# Patient Record
Sex: Male | Born: 1935 | Race: Black or African American | Hispanic: No | Marital: Married | Smoking: Former smoker
Health system: Southern US, Community
[De-identification: ages and names within clinical notes are randomized; demographics above are authoritative.]

## PROBLEM LIST (undated history)

## (undated) DIAGNOSIS — I10 Essential (primary) hypertension: Secondary | ICD-10-CM

## (undated) DIAGNOSIS — C801 Malignant (primary) neoplasm, unspecified: Secondary | ICD-10-CM

## (undated) DIAGNOSIS — N200 Calculus of kidney: Secondary | ICD-10-CM

## (undated) DIAGNOSIS — K8020 Calculus of gallbladder without cholecystitis without obstruction: Secondary | ICD-10-CM

## (undated) HISTORY — PX: PROSTATE SURGERY: SHX751

---

## 2011-12-02 ENCOUNTER — Observation Stay (HOSPITAL_COMMUNITY)
Admission: EM | Admit: 2011-12-02 | Discharge: 2011-12-03 | Disposition: A | Discharge status: Home / Self Care | Payer: Medicare Other | Attending: Internal Medicine | Admitting: Internal Medicine

## 2011-12-02 ENCOUNTER — Emergency Department (HOSPITAL_COMMUNITY): Payer: Medicare Other

## 2011-12-02 ENCOUNTER — Other Ambulatory Visit: Payer: Self-pay

## 2011-12-02 DIAGNOSIS — D509 Iron deficiency anemia, unspecified: Secondary | ICD-10-CM | POA: Insufficient documentation

## 2011-12-02 DIAGNOSIS — I10 Essential (primary) hypertension: Secondary | ICD-10-CM | POA: Diagnosis present

## 2011-12-02 DIAGNOSIS — R55 Syncope and collapse: Principal | ICD-10-CM | POA: Diagnosis present

## 2011-12-02 HISTORY — DX: Calculus of gallbladder without cholecystitis without obstruction: K80.20

## 2011-12-02 HISTORY — DX: Malignant (primary) neoplasm, unspecified: C80.1

## 2011-12-02 HISTORY — DX: Calculus of kidney: N20.0

## 2011-12-02 HISTORY — DX: Essential (primary) hypertension: I10

## 2011-12-02 LAB — GLUCOSE, CAPILLARY

## 2011-12-02 LAB — DIFFERENTIAL
Basophils Relative: 1 % (ref 0–1)
Lymphs Abs: 1.1 10*3/uL (ref 0.7–4.0)
Monocytes Relative: 7 % (ref 3–12)
Neutro Abs: 6.7 10*3/uL (ref 1.7–7.7)
Neutrophils Relative %: 79 % — ABNORMAL HIGH (ref 43–77)

## 2011-12-02 LAB — BASIC METABOLIC PANEL
BUN: 26 mg/dL — ABNORMAL HIGH (ref 6–23)
Chloride: 106 mEq/L (ref 96–112)
GFR calc Af Amer: 38 mL/min — ABNORMAL LOW (ref >90)
Glucose, Bld: 149 mg/dL — ABNORMAL HIGH (ref 70–99)
Potassium: 4.1 mEq/L (ref 3.5–5.1)

## 2011-12-02 LAB — CBC
Hemoglobin: 11.5 g/dL — ABNORMAL LOW (ref 13.0–17.0)
RBC: 5.26 MIL/uL (ref 4.22–5.81)
WBC: 8.5 10*3/uL (ref 4.0–10.5)

## 2011-12-02 LAB — POCT I-STAT TROPONIN I: Troponin i, poc: 0 ng/mL (ref 0.00–0.08)

## 2011-12-02 MED ORDER — TECHNETIUM TO 99M ALBUMIN AGGREGATED
6.0000 | Freq: Once | INTRAVENOUS | Status: AC | PRN
  Administered 2011-12-02: 6 via INTRAVENOUS

## 2011-12-02 MED ORDER — XENON XE 133 GAS
10.0000 | GAS_FOR_INHALATION | Freq: Once | RESPIRATORY_TRACT | Status: AC | PRN
  Administered 2011-12-02: 10 via RESPIRATORY_TRACT

## 2011-12-02 MED ORDER — SODIUM CHLORIDE 0.9 % IV BOLUS (SEPSIS)
1000.0000 mL | Freq: Once | INTRAVENOUS | Status: AC
  Administered 2011-12-02: 1000 mL via INTRAVENOUS

## 2011-12-02 NOTE — ED Notes (Signed)
Pt states he drove 400 miles yesterday, and was praying with a friend and became syncopal.

## 2011-12-02 NOTE — ED Notes (Signed)
Pt is in nuclear medicine

## 2011-12-02 NOTE — ED Notes (Signed)
Pt sleeping   Family at bedside. 

## 2011-12-02 NOTE — H&P (Signed)
[name] [name] is an 77 y.o. male.   PCP is at [facility] Chief Complaint: Syncope. HPI: 77 year-old male with history of hypertension had a sudden spell of loss of consciousness as witnessed by his niece in the church today around 2:30 PM while the patient was praying. The knee states that the patient may have lost consciousness for around 8-10 seconds. He regained his consciousness spontaneously. When the EMS came to pick him up his blood pressure systolic was found to be in the 70s. In the ER he was given some fluid and his blood pressures remained stable. Patient denies any chest pain, shortness of breath, any headache, visual symptoms, focal deficits, nausea vomiting or diarrhea. Patient had recently traveled to [place] by car. He drove the car 2 nights ago to [place] with his wife and came back last night and reached today morning 4:30 AM. He went to the church at 8 AM. In the ER patient was found to have mildly elevated creatinine and patient does not recall having any kidney problem other than kidney stones. He is also mildly anemic and patient states he had a colonoscopy a year and half ago which was normal. Due to his mildly elevated creatinine patient had VQ scan which was showing a low probability for PE. Patient has been admitted for observation.  Past Medical History  Diagnosis Date  . Cancer   . Hypertension   . Stone, kidney   . Stone, kidney     Past Surgical History  Procedure Date  . Prostate surgery     Family History  Problem Relation Age of Onset  . Lung cancer Sister    Social History:  reports that he has never smoked. He does not have any smokeless tobacco history on file. He reports that he does not drink alcohol or use illicit drugs.  Allergies: No Known Allergies  Medications Prior to Admission  Medication Dose Route Frequency Provider Last Rate Last Dose  . sodium chloride 0.9 % bolus 1,000 mL  1,000 mL Intravenous Once [employer]   1,000 mL at 12/02/11 1750  . technetium albumin aggregated (MAA) injection solution 6 milli Curie  6 milli Curie Intravenous Once PRN Medication Radiologist   [place] at 12/02/11 1905  . xenon xe 133 gas 10 milli Curie  10 milli Curie Inhalation Once PRN Medication Radiologist   [place] at 12/02/11 [id number]   No current outpatient prescriptions on file as of 12/02/2011.    Results for orders placed during the hospital encounter of 12/02/11 (from the past 48 hour(s))  CBC     Status: Abnormal   Collection Time   12/02/11  3:35 PM      Component Value Range Comment   WBC 8.5  4.0 - 10.5 (K/uL)    RBC 5.26  4.22 - 5.81 (MIL/uL)    Hemoglobin 11.5 (*) 13.0 - 17.0 (g/dL)    HCT 36.9 (*) 39.0 - 52.0 (%)    MCV 70.2 (*) 78.0 - 100.0 (fL)    MCH 21.9 (*) 26.0 - 34.0 (pg)    MCHC 31.2  30.0 - 36.0 (g/dL)    RDW 14.4  11.5 - 15.5 (%)    Platelets 187  150 - 400 (K/uL)   DIFFERENTIAL     Status: Abnormal   Collection Time   12/02/11  3:35 PM      Component Value Range Comment   Neutrophils Relative 79 (*) 43 - 77 (%)  Neutro Abs 6.7  1.7 - 7.7 (K/uL)    Lymphocytes Relative 13  12 - 46 (%)    Lymphs Abs 1.1  0.7 - 4.0 (K/uL)    Monocytes Relative 7  3 - 12 (%)    Monocytes Absolute 0.6  0.1 - 1.0 (K/uL)    Eosinophils Relative 1  0 - 5 (%)    Eosinophils Absolute 0.1  0.0 - 0.7 (K/uL)    Basophils Relative 1  0 - 1 (%)    Basophils Absolute 0.0  0.0 - 0.1 (K/uL)   BASIC METABOLIC PANEL     Status: Abnormal   Collection Time   12/02/11  3:35 PM      Component Value Range Comment   Sodium 139  135 - 145 (mEq/L)    Potassium 4.1  3.5 - 5.1 (mEq/L)    Chloride 106  96 - 112 (mEq/L)    CO2 26  19 - 32 (mEq/L)    Glucose, Bld 149 (*) 70 - 99 (mg/dL)    BUN 26 (*) 6 - 23 (mg/dL)    Creatinine, Ser 1.88 (*) 0.50 - 1.35 (mg/dL)    Calcium 9.3  8.4 - 10.5 (mg/dL)    GFR calc non Af Amer 33 (*) >90 (mL/min)    GFR calc Af Amer 38 (*) >90 (mL/min)   GLUCOSE, CAPILLARY      Status: Abnormal   Collection Time   12/02/11  3:51 PM      Component Value Range Comment   Glucose-Capillary 129 (*) 70 - 99 (mg/dL)    Comment 1 Notify RN      Comment 2 Documented in Chart     POCT I-STAT TROPONIN I     Status: Normal   Collection Time   12/02/11  4:05 PM      Component Value Range Comment   Troponin i, poc 0.00  0.00 - 0.08 (ng/mL)    Comment 3             Dg Chest 2 View  12/02/2011  *RADIOLOGY REPORT*  Clinical Data: Syncope  CHEST - 2 VIEW  Comparison: None.  Findings: Lungs are essentially clear. No pleural effusion or pneumothorax.  Mild elevation of the left hemidiaphragm.  Cardiomediastinal silhouette is within normal limits.  Mild degenerative changes of the visualized thoracolumbar spine.  IMPRESSION: No evidence of acute cardiopulmonary disease.  Original Report Authenticated By: Julian Hy, M.D.   Nm Pulmonary Per & Vent  12/02/2011  *RADIOLOGY REPORT*  Clinical Data: Syncope  NM PULMONARY VENTILATION AND PERFUSION SCAN  Radiopharmaceutical: 10 mCi xenon 133 gas.  6 mCi technetium 35m MAA.  Comparison: Chest x-ray from earlier the same day  Findings: Multiplanar perfusion imaging shows no segmental perfusion defect in either lung.  Asymmetric elevation of the left hemidiaphragm is seen on the x-ray is evident on perfusion imaging.  Ventilation imaging shows normal wash in with no substantial air trapping.  IMPRESSION: Normal bilateral lung perfusion.  Original Report Authenticated By: ERIC A. MANSELL, M.D.    Review of Systems  Constitutional: Negative.   HENT: Negative.   Eyes: Negative.   Respiratory: Negative.   Cardiovascular: Negative.   Gastrointestinal: Negative.   Genitourinary: Negative.   Musculoskeletal: Negative.   Skin: Negative.   Neurological: Positive for loss of consciousness.  Endo/Heme/Allergies: Negative.   Psychiatric/Behavioral: Negative.     Blood pressure 111/94, pulse 68, temperature 98.2 F (36.8 C), temperature  source Oral, resp. rate 18, SpO2 97.00%. Physical  Exam  Constitutional: He is oriented to person, place, and time. He appears well-developed and well-nourished. No distress.  HENT:  Head: Normocephalic and atraumatic.  Right Ear: External ear normal.  Left Ear: External ear normal.  Nose: Nose normal.  Mouth/Throat: Oropharynx is clear and moist. No oropharyngeal exudate.  Eyes: Conjunctivae and EOM are normal. Pupils are equal, round, and reactive to light. Right eye exhibits no discharge. Left eye exhibits no discharge.  Neck: Normal range of motion. Neck supple. No JVD present.  Cardiovascular: Normal rate, regular rhythm, normal heart sounds and intact distal pulses.   Respiratory: Effort normal and breath sounds normal. No stridor. No respiratory distress. He has no wheezes. He has no rales.  GI: Soft. Bowel sounds are normal. He exhibits no distension. There is no tenderness. There is no rebound.  Musculoskeletal: Normal range of motion. He exhibits no edema and no tenderness.  Neurological: He is alert and oriented to person, place, and time. He has normal reflexes. No cranial nerve deficit.       Moves upper and lower extremities 5/5.  Skin: Skin is warm and dry. No rash noted. He is not diaphoretic. No erythema.  Psychiatric: His behavior is normal.     Assessment/Plan #1. Syncope. #2. Mild renal failure. #3. Microcyte hypochromic anemia. #4. History of hypertension.  Plan Admit to telemetry for observation. We do not know the exact cause for his syncope could be from exhaustion from recent travel and dehydration. We will get a 2-D echo and a CT of the head without contrast for further workup on this syncope. In the ER he was not found to be orthostatic. We're going to hydrate the patient with normal saline and we'll hold off as antihypertensive, amlodipine for now as patient was found to be hypotensive initially. He does a mild insufficiency which patient does not recall  having. We will check a urine analysis and closely follow his metabolic panel. We'll also check creatine kinase to rule out rhabdomyolysis. Patient does have microcytic hypochromic anemia. We will recheck a CBC in a.m. and also check anemia panel. Patient states he had a colonoscopy a year and a half ago which was normal.  Gean Birchwood N. 12/02/2011, 10:58 PM

## 2011-12-02 NOTE — ED Provider Notes (Signed)
History     CSN: [id number] Arrival date & time: 12/02/2011  3:02 PM   First MD Initiated Contact with Patient 12/02/11 1504      Chief Complaint  Patient presents with  . Near Syncope    became dizzy at church and eased down to ground with syncope. pt denies and pain or problems.  hasn't eaten since last night    (Consider location/radiation/quality/duration/timing/severity/associated sxs/prior treatment) The history is provided by the patient, a relative and the spouse.   patient is a 19 are old male with a history of hypertension presents after syncopal episode. This occurred about 2 hours prior to arrival. Patient withstanding up at church when this occurred. He had been standing for some time, had his eyes closed, and was pain. He denied feeling any sense of lightheadedness, vertigo, chest pain, dyspnea during this time.  During this [place], he had a witnessed syncopal event. He fell over a seat backwards. He had a loss of consciousness for about 2 minutes. When he awoke he was alert and oriented and acting like his normal self. No generalized tonic-clonic activity, lipsmacking, eye deviation, tongue biting. He did have mild myoclonus of right hand that lasted a few seconds.  Since the episode patient feels like his normal self. He denies any recent fever or other infectious symptoms. He recently drove back from [place] overnight. He states he got home at 4 AM, went to bed, and then had to wake up early for church. He did not have anything significant he today. Patient has no history of prior syncope or  cardiac issues.  Overall severity noted to be mild to moderate nothing has been noted to make things better or worse.  When EMS arrived, patient was diaphoretic with systolic pressure in the low 70s. Blood sugar okay. Blood pressure improved at the scene.     Past Medical History  Diagnosis Date  . Hypertension   . Stone, kidney   . Stone, kidney   . Cancer     prostate  .  Gallstones     Past Surgical History  Procedure Date  . Prostate surgery     Family History  Problem Relation Age of Onset  . Lung cancer Sister     History  Substance Use Topics  . Smoking status: Former Smoker -- 0.5 packs/day for 20 years    Quit date: 12/02/1972  . Smokeless tobacco: Never Used  . Alcohol Use: No      Review of Systems  Constitutional: Negative for fever, chills and activity change.  HENT: Negative for congestion and neck pain.   Respiratory: Negative for cough, chest tightness, shortness of breath and wheezing.   Cardiovascular: Negative for chest pain.  Gastrointestinal: Negative for nausea, vomiting, abdominal pain, diarrhea and abdominal distention.  Genitourinary: Negative for difficulty urinating.  Musculoskeletal: Negative for gait problem.  Skin: Negative for rash.  Neurological: Negative for weakness and numbness.  Psychiatric/Behavioral: Negative for behavioral problems and confusion.  All other systems reviewed and are negative.    Allergies  Review of patient's allergies indicates no known allergies.  Home Medications   No current outpatient prescriptions on file.  BP 161/83  Pulse 68  Temp(Src) 97.6 F (36.4 C) (Oral)  Resp 19  Ht 6' 1.5" (1.867 m)  Wt 235 lb 10.8 oz (106.9 kg)  BMI 30.67 kg/m2  SpO2 97%  Physical Exam  Nursing note and vitals reviewed. Constitutional: He is oriented to person, place, and time. He appears well-developed  and well-nourished. No distress.  HENT:  Head: Normocephalic.  Nose: Nose normal.  Eyes: EOM are normal. Pupils are equal, round, and reactive to light. No scleral icterus.  Neck: Normal range of motion. Neck supple. No JVD present.  Cardiovascular: Normal rate, regular rhythm and intact distal pulses.   No murmur heard. Pulmonary/Chest: Effort normal and breath sounds normal. No respiratory distress. He has no wheezes.  Abdominal: Soft. Bowel sounds are normal. He exhibits no  distension. There is no tenderness.  Musculoskeletal: Normal range of motion. He exhibits no edema and no tenderness.  Neurological: He is alert and oriented to person, place, and time. He has normal strength. No cranial nerve deficit or sensory deficit. He displays a negative Romberg sign. Coordination normal. GCS eye subscore is 4. GCS verbal subscore is 5. GCS motor subscore is 6.       Normal strength No pronator drift  Skin: Skin is warm and dry. He is not diaphoretic.  Psychiatric: He has a normal mood and affect. His behavior is normal. Thought content normal.    ED Course  Procedures (including critical care time)   Date: 12/02/2011  Rate: 69  Rhythm: normal sinus rhythm  QRS Axis: normal  Intervals: normal  ST/T Wave abnormalities: nonspecific ST changes  Conduction Disutrbances:none  Narrative Interpretation: poor quality with artifact at end of strip.  No acute ischemia.  Old EKG Reviewed: none available     Labs Reviewed  CBC - Abnormal; Notable for the following:    Hemoglobin 11.5 (*)    HCT 36.9 (*)    MCV 70.2 (*)    MCH 21.9 (*)    All other components within normal limits  DIFFERENTIAL - Abnormal; Notable for the following:    Neutrophils Relative 79 (*)    All other components within normal limits  BASIC METABOLIC PANEL - Abnormal; Notable for the following:    Glucose, Bld 149 (*)    BUN 26 (*)    Creatinine, Ser 1.88 (*)    GFR calc non Af Amer 33 (*)    GFR calc Af Amer 38 (*)    All other components within normal limits  GLUCOSE, CAPILLARY - Abnormal; Notable for the following:    Glucose-Capillary 129 (*)    All other components within normal limits  POCT I-STAT TROPONIN I  COMPREHENSIVE METABOLIC PANEL  CBC  TSH  VITAMIN B12  FOLATE  IRON AND TIBC  FERRITIN  RETICULOCYTES  URINALYSIS, ROUTINE W REFLEX MICROSCOPIC  CARDIAC PANEL(CRET KIN+CKTOT+MB+TROPI)  CARDIAC PANEL(CRET KIN+CKTOT+MB+TROPI)  CARDIAC PANEL(CRET KIN+CKTOT+MB+TROPI)    Dg Chest 2 View  12/02/2011  *RADIOLOGY REPORT*  Clinical Data: Syncope  CHEST - 2 VIEW  Comparison: None.  Findings: Lungs are essentially clear. No pleural effusion or pneumothorax.  Mild elevation of the left hemidiaphragm.  Cardiomediastinal silhouette is within normal limits.  Mild degenerative changes of the visualized thoracolumbar spine.  IMPRESSION: No evidence of acute cardiopulmonary disease.  Original Report Authenticated By: Julian Hy, M.D.   Nm Pulmonary Per & Vent  12/02/2011  *RADIOLOGY REPORT*  Clinical Data: Syncope  NM PULMONARY VENTILATION AND PERFUSION SCAN  Radiopharmaceutical: 10 mCi xenon 133 gas.  6 mCi technetium 58m MAA.  Comparison: Chest x-ray from earlier the same day  Findings: Multiplanar perfusion imaging shows no segmental perfusion defect in either lung.  Asymmetric elevation of the left hemidiaphragm is seen on the x-ray is evident on perfusion imaging.  Ventilation imaging shows normal wash in with no substantial air  trapping.  IMPRESSION: Normal bilateral lung perfusion.  Original Report Authenticated By: ERIC A. MANSELL, M.D.     No diagnosis found.  Diagnosis: Syncope  MDM   Clinical picture is consistent with syncope. Patient did not have preceding chest pain or dyspnea. EKG here negative for interval prolongation or acute ischemia. His neurologic exam is normal and he did not have any focal neuro changes around this incident.  Also no evidence of head injury. No acute indication for head CT.  Patient has had significant amount of traveling recently in with episode of syncope concern about possibility of PE. CTA not possible due to her decreased GFR. VQ scan done and was low probability. Patient denies history of chronic kidney disease but his creatinine is 1.88. This is likely prerenal in etiology based on decreased by mouth intake lately. This may also be contributing to his syncope his orthostatic were negative. Based on age, nature of syncope, and  record her blood pressure in the 70s, consult to medicine who will admit for syncope. Patient was hemodynamically stable and stable from a mental status standpoint while [place]. No recent infectious symptoms that would suggest underlying infection as etiology of low blood pressure.       Fritz Pickerel 12/03/11 0031

## 2011-12-02 NOTE — ED Notes (Signed)
Patient is resting comfortably. 

## 2011-12-02 NOTE — ED Notes (Signed)
Vital signs stable. 

## 2011-12-02 NOTE — ED Notes (Signed)
Family at bedside. 

## 2011-12-02 NOTE — ED Notes (Signed)
Family at bedside.  Snacks offered to family.  Awaiting VQ scan

## 2011-12-02 NOTE — ED Notes (Signed)
Ice chips given with permission of provider

## 2011-12-02 NOTE — ED Notes (Signed)
Meal tray provided per pt request.  

## 2011-12-02 NOTE — ED Notes (Signed)
Patient denies pain and is resting comfortably.  

## 2011-12-02 NOTE — ED Notes (Signed)
Patient transported to X-ray 

## 2011-12-02 NOTE — ED Notes (Signed)
CBG is 129.  Patient c/o being hungry. Multiple family members at bedside

## 2011-12-03 ENCOUNTER — Encounter (HOSPITAL_COMMUNITY): Payer: Self-pay | Admitting: Nurse Practitioner

## 2011-12-03 ENCOUNTER — Inpatient Hospital Stay (HOSPITAL_COMMUNITY): Payer: Medicare Other

## 2011-12-03 LAB — CARDIAC PANEL(CRET KIN+CKTOT+MB+TROPI)
CK, MB: 2.9 ng/mL (ref 0.3–4.0)
Relative Index: 1.9 (ref 0.0–2.5)
Troponin I: <0.3 ng/mL (ref <0.30)

## 2011-12-03 LAB — CBC
HCT: 32 % — ABNORMAL LOW (ref 39.0–52.0)
MCHC: 30.6 g/dL (ref 30.0–36.0)
MCV: 70.8 fL — ABNORMAL LOW (ref 78.0–100.0)
Platelets: 165 10*3/uL (ref 150–400)
RDW: 14.5 % (ref 11.5–15.5)
WBC: 6.7 10*3/uL (ref 4.0–10.5)

## 2011-12-03 LAB — COMPREHENSIVE METABOLIC PANEL
ALT: 13 U/L (ref 0–53)
Albumin: 3.1 g/dL — ABNORMAL LOW (ref 3.5–5.2)
Alkaline Phosphatase: 98 U/L (ref 39–117)
BUN: 22 mg/dL (ref 6–23)
Chloride: 110 mEq/L (ref 96–112)
Glucose, Bld: 106 mg/dL — ABNORMAL HIGH (ref 70–99)
Potassium: 3.9 mEq/L (ref 3.5–5.1)
Sodium: 143 mEq/L (ref 135–145)
Total Bilirubin: 0.4 mg/dL (ref 0.3–1.2)
Total Protein: 6.3 g/dL (ref 6.0–8.3)

## 2011-12-03 LAB — FOLATE: Folate: 9.8 ng/mL

## 2011-12-03 LAB — IRON AND TIBC
Saturation Ratios: 23 % (ref 20–55)
UIBC: 203 ug/dL (ref 125–400)

## 2011-12-03 LAB — RETICULOCYTES
RBC.: 5.07 MIL/uL (ref 4.22–5.81)
Retic Count, Absolute: 40.6 10*3/uL (ref 19.0–186.0)
Retic Ct Pct: 0.8 % (ref 0.4–3.1)

## 2011-12-03 LAB — FERRITIN: Ferritin: 421 ng/mL — ABNORMAL HIGH (ref 22–322)

## 2011-12-03 MED ORDER — ONDANSETRON HCL 4 MG PO TABS: 4.0000 mg | ORAL_TABLET | Freq: Four times a day (QID) | ORAL | Status: DC | PRN

## 2011-12-03 MED ORDER — ACETAMINOPHEN 650 MG RE SUPP: 650.0000 mg | Freq: Four times a day (QID) | RECTAL | Status: DC | PRN

## 2011-12-03 MED ORDER — AMLODIPINE BESYLATE 10 MG PO TABS
10.0000 mg | ORAL_TABLET | Freq: Every day | ORAL | Status: DC
  Administered 2011-12-03: 10 mg via ORAL
  Filled 2011-12-03: qty 1

## 2011-12-03 MED ORDER — ONDANSETRON HCL 4 MG/2ML IJ SOLN: 4.0000 mg | Freq: Four times a day (QID) | INTRAMUSCULAR | Status: DC | PRN

## 2011-12-03 MED ORDER — SODIUM CHLORIDE 0.9 % IV SOLN
INTRAVENOUS | Status: DC
  Administered 2011-12-03: 03:00:00 via INTRAVENOUS

## 2011-12-03 MED ORDER — ACETAMINOPHEN 325 MG PO TABS: 650.0000 mg | ORAL_TABLET | Freq: Four times a day (QID) | ORAL | Status: DC | PRN

## 2011-12-03 NOTE — Discharge Summary (Signed)
Physician Discharge Summary  Patient ID: [name] [name] MRN: [id number] DOB/AGE: Oct 02, 1933 77 y.o.  Admit date: 12/02/2011 Discharge date: 12/03/2011  Primary Care Physician:  Provider Not In System   Discharge Diagnoses:    Principal Problem:  *Syncope Active Problems:  HTN (hypertension)    Discharge Medication List as of 12/03/2011 12:21 PM    CONTINUE these medications which have NOT CHANGED   Details  amLODipine (NORVASC) 10 MG tablet Take 10 mg by mouth daily.  , Until Discontinued, Historical Med         Disposition and Follow-up:  Discharge home in stable and improved condition. Needs to followup with his primary care physician in 3-4 weeks.  Consults:  none    Significant Diagnostic Studies:  Dg Chest 2 View  12/02/2011  *RADIOLOGY REPORT*  Clinical Data: Syncope  CHEST - 2 VIEW  Comparison: None.  Findings: Lungs are essentially clear. No pleural effusion or pneumothorax.  Mild elevation of the left hemidiaphragm.  Cardiomediastinal silhouette is within normal limits.  Mild degenerative changes of the visualized thoracolumbar spine.  IMPRESSION: No evidence of acute cardiopulmonary disease.  Original Report Authenticated By: Julian Hy, M.D.   Ct Head Wo Contrast  12/03/2011  *RADIOLOGY REPORT*  Clinical Data: 77 year old male with syncope.  History of hypertension.  Low blood pressure on presentation.  CT HEAD WITHOUT CONTRAST  Technique:  Contiguous axial images were obtained from the base of the skull through the vertex without contrast.  Comparison: None.  Findings: Visualized paranasal sinuses and mastoids are clear.  No acute osseous abnormality identified.  Visualized scalp soft tissues are within normal limits.  Visualized orbit soft tissues are within normal limits.  Generalized cerebral volume loss.  No ventriculomegaly. No midline shift, mass effect, or evidence of mass lesion.  No acute intracranial hemorrhage identified.  Dural calcifications.   Mild Calcified atherosclerosis at the skull base.  Mild for age scattered white and gray matter hypodensity, including punctate lesions in the right basal ganglia.  IMPRESSION: No acute intracranial abnormality.  Generalized cerebral volume loss.  Mild for age chronic small vessel disease.  Original Report Authenticated By: Randall An, M.D.   Nm Pulmonary Per & Vent  12/02/2011  *RADIOLOGY REPORT*  Clinical Data: Syncope  NM PULMONARY VENTILATION AND PERFUSION SCAN  Radiopharmaceutical: 10 mCi xenon 133 gas.  6 mCi technetium 75m MAA.  Comparison: Chest x-ray from earlier the same day  Findings: Multiplanar perfusion imaging shows no segmental perfusion defect in either lung.  Asymmetric elevation of the left hemidiaphragm is seen on the x-ray is evident on perfusion imaging.  Ventilation imaging shows normal wash in with no substantial air trapping.  IMPRESSION: Normal bilateral lung perfusion.  Original Report Authenticated By: ERIC A. MANSELL, M.D.    Brief H and P: For complete details please refer to admission H and P, but in brief patient is a 77 year old black man with a history of hypertension who had a sudden episode of loss of consciousness while in church. Because of this was a metatarsal raise for further evaluation and management.    Hospital Course:  Principal Problem:  *Syncope Active Problems:  HTN (hypertension)   #1 syncope: CT scan of the head was negative. He was not found to be orthostatic. He had traveled to and from [place] the same day he had the syncopal event so he had a VQ scan that ruled him out for pulmonary embolism. 2-D echo and carotid Dopplers were ordered however patient refused. Patient denies  having true loss of consciousness. He states this was a "spiritual event" and that the holy spirit came to him while he was giving communion to a teenager at church.  #2 acute renal failure: Creatinine on admission was 1.88. Patient denies ever being told he had  any sort of kidney dysfunction. His creatinine at time of discharge was 1.46. This is likely secondary to prerenal azotemia. He has been instructed to followup with his primary care doctor in 2-3 weeks for followup on his renal function.  Time spent on Discharge: Greater than 30 minutes.  SignedLelon Frohlich [facility] Pager: [phone] 12/03/2011, 5:52 PM

## 2011-12-03 NOTE — ED Provider Notes (Signed)
I saw and evaluated the patient, reviewed the resident's note and I agree with the findings and plan., ekg interpretation (reviewed by me)  RRR, clammy and pale on arrival. Syncope w/u unremarkable in ED but pt with SBP reportedly 70s on EMS arrival to scene. WIll admit for obs.  Blair Heys, MD 12/03/11 [phone]

## 2011-12-03 NOTE — Progress Notes (Signed)
Pt discharged to home via wheelchair with spouse. Discharge instructions given and pt. verbalized understanding. No further questions at this time. All pt. Belongings packed and sent with patient.

## 2011-12-03 NOTE — Progress Notes (Signed)
Cosign for Moises Blood LPN for medications and assessment

## 2011-12-03 NOTE — Progress Notes (Signed)
   CARE MANAGEMENT NOTE 12/03/2011  Patient:  [name],[name]   Account Number:  [id number]  Date Initiated:  12/03/2011  Documentation initiated by:  Tomi Bamberger  Subjective/Objective Assessment:   dx syncope, arf  admit- lives with spouse.  pta independent.     Action/Plan:   Anticipated DC Date:  12/03/2011   Anticipated DC Plan:  [place]  CM consult      Choice offered to / List presented to:             Status of service:  Completed, signed off Medicare Important Message given?   (If response is "NO", the following Medicare IM given date fields will be blank) Date Medicare IM given:   Date Additional Medicare IM given:    Discharge Disposition:  HOME/SELF CARE  Per UR Regulation:    Comments:  12/03/11 13:53 Tomi Bamberger RN, BSN [phone] Patient lives with spouse, pta independent, no needs identified.

## 2011-12-03 NOTE — Progress Notes (Signed)
Utilization review completed.  

## 2013-09-16 ENCOUNTER — Encounter (HOSPITAL_COMMUNITY): Payer: Self-pay | Admitting: Emergency Medicine

## 2013-09-16 ENCOUNTER — Emergency Department (INDEPENDENT_AMBULATORY_CARE_PROVIDER_SITE_OTHER)
Admission: EM | Admit: 2013-09-16 | Discharge: 2013-09-16 | Disposition: A | Discharge status: Home / Self Care | Payer: Medicare Other | Source: Home / Self Care | Attending: Family Medicine | Admitting: Family Medicine

## 2013-09-16 DIAGNOSIS — J309 Allergic rhinitis, unspecified: Secondary | ICD-10-CM

## 2013-09-16 DIAGNOSIS — J302 Other seasonal allergic rhinitis: Secondary | ICD-10-CM

## 2013-09-16 MED ORDER — CETIRIZINE HCL 10 MG PO TABS: 10.0000 mg | ORAL_TABLET | Freq: Every day | ORAL | Status: DC

## 2013-09-16 MED ORDER — TRIAMCINOLONE ACETONIDE 40 MG/ML IJ SUSP
INTRAMUSCULAR | Status: AC
  Filled 2013-09-16: qty 1

## 2013-09-16 MED ORDER — TRIAMCINOLONE ACETONIDE 40 MG/ML IJ SUSP
40.0000 mg | Freq: Once | INTRAMUSCULAR | Status: AC
  Administered 2013-09-16: 40 mg via INTRAMUSCULAR

## 2013-09-16 MED ORDER — FLUTICASONE PROPIONATE 50 MCG/ACT NA SUSP: 2.0000 | Freq: Two times a day (BID) | NASAL | Status: DC

## 2013-09-16 NOTE — ED Provider Notes (Signed)
CSN: [id number]     Arrival date & time 09/16/13  1622 History   First MD Initiated Contact with Patient 09/16/13 1728     Chief Complaint  Patient presents with  . Sinusitis   (Consider location/radiation/quality/duration/timing/severity/associated sxs/prior Treatment) Patient is a 79 y.o. male presenting with sinusitis. The history is provided by the patient.  Sinusitis Pain details:    Location:  Maxillary   Quality:  Pressure   Severity:  Mild   Duration:  2 days Progression:  Worsening Associated symptoms: cough, rhinorrhea and sneezing   Associated symptoms: no chills, no fever, no shortness of breath and no wheezing     Past Medical History  Diagnosis Date  . Hypertension   . Stone, kidney   . Stone, kidney   . Cancer     prostate  . Gallstones    Past Surgical History  Procedure Laterality Date  . Prostate surgery     Family History  Problem Relation Age of Onset  . Lung cancer Sister    History  Substance Use Topics  . Smoking status: Former Smoker -- 0.50 packs/day for 20 years    Quit date: 12/02/1972  . Smokeless tobacco: Never Used  . Alcohol Use: No    Review of Systems  Constitutional: Negative.  Negative for fever and chills.  HENT: Positive for rhinorrhea and sneezing.   Respiratory: Positive for cough. Negative for shortness of breath and wheezing.   Cardiovascular: Negative for leg swelling.    Allergies  Review of patient's allergies indicates no known allergies.  Home Medications   Current Outpatient Rx  Name  Route  Sig  Dispense  Refill  . amLODipine (NORVASC) 10 MG tablet   Oral   Take 10 mg by mouth daily.           . cetirizine (ZYRTEC) 10 MG tablet   Oral   Take 1 tablet (10 mg total) by mouth daily. One tab daily for allergies   30 tablet   1   . fluticasone (FLONASE) 50 MCG/ACT nasal spray   Nasal   Place 2 sprays into the nose 2 (two) times daily.   1 g   2    BP 157/99  Pulse 80  Temp(Src) 98.4 F (36.9 C)  (Oral)  Resp 19  SpO2 96% Physical Exam  Nursing note and vitals reviewed. Constitutional: He is oriented to person, place, and time. He appears well-developed and well-nourished.  HENT:  Head: Normocephalic.  Right Ear: External ear normal.  Left Ear: External ear normal.  Nose: Mucosal edema and rhinorrhea present.  Mouth/Throat: Oropharynx is clear and moist.  Neck: Normal range of motion. Neck supple.  Cardiovascular: Regular rhythm.   Pulmonary/Chest: Breath sounds normal.  Musculoskeletal: He exhibits no edema.  Lymphadenopathy:    He has no cervical adenopathy.  Neurological: He is alert and oriented to person, place, and time.  Skin: Skin is warm and dry.    ED Course  Procedures (including critical care time) Labs Review Labs Reviewed - No data to display Imaging Review No results found.  MDM      Billy Fischer, MD 09/16/13 [phone]

## 2013-09-16 NOTE — ED Notes (Signed)
Pt c/o sinus drainage with cough and sneezing x 2 days. No congestion or fever. Pt is alert and oriented.

## 2015-01-05 ENCOUNTER — Encounter (HOSPITAL_COMMUNITY): Payer: Self-pay | Admitting: Emergency Medicine

## 2015-01-05 ENCOUNTER — Emergency Department (HOSPITAL_COMMUNITY): Payer: Medicare Other

## 2015-01-05 ENCOUNTER — Emergency Department (HOSPITAL_COMMUNITY)
Admission: EM | Admit: 2015-01-05 | Discharge: 2015-01-06 | Disposition: A | Discharge status: Home / Self Care | Payer: Medicare Other | Attending: Emergency Medicine | Admitting: Emergency Medicine

## 2015-01-05 DIAGNOSIS — Z7951 Long term (current) use of inhaled steroids: Secondary | ICD-10-CM | POA: Diagnosis not present

## 2015-01-05 DIAGNOSIS — R600 Localized edema: Secondary | ICD-10-CM | POA: Diagnosis not present

## 2015-01-05 DIAGNOSIS — Z87891 Personal history of nicotine dependence: Secondary | ICD-10-CM | POA: Insufficient documentation

## 2015-01-05 DIAGNOSIS — R112 Nausea with vomiting, unspecified: Secondary | ICD-10-CM | POA: Insufficient documentation

## 2015-01-05 DIAGNOSIS — Z8546 Personal history of malignant neoplasm of prostate: Secondary | ICD-10-CM | POA: Diagnosis not present

## 2015-01-05 DIAGNOSIS — R531 Weakness: Secondary | ICD-10-CM | POA: Insufficient documentation

## 2015-01-05 DIAGNOSIS — I10 Essential (primary) hypertension: Secondary | ICD-10-CM | POA: Diagnosis not present

## 2015-01-05 DIAGNOSIS — Z8719 Personal history of other diseases of the digestive system: Secondary | ICD-10-CM | POA: Insufficient documentation

## 2015-01-05 DIAGNOSIS — R1032 Left lower quadrant pain: Secondary | ICD-10-CM | POA: Diagnosis not present

## 2015-01-05 DIAGNOSIS — Z7982 Long term (current) use of aspirin: Secondary | ICD-10-CM | POA: Insufficient documentation

## 2015-01-05 DIAGNOSIS — Z87442 Personal history of urinary calculi: Secondary | ICD-10-CM | POA: Diagnosis not present

## 2015-01-05 DIAGNOSIS — R197 Diarrhea, unspecified: Secondary | ICD-10-CM | POA: Insufficient documentation

## 2015-01-05 DIAGNOSIS — Z9889 Other specified postprocedural states: Secondary | ICD-10-CM | POA: Diagnosis not present

## 2015-01-05 DIAGNOSIS — Z79899 Other long term (current) drug therapy: Secondary | ICD-10-CM | POA: Diagnosis not present

## 2015-01-05 LAB — URINALYSIS, ROUTINE W REFLEX MICROSCOPIC
Bilirubin Urine: NEGATIVE
Glucose, UA: NEGATIVE mg/dL
Ketones, ur: NEGATIVE mg/dL
LEUKOCYTES UA: NEGATIVE
Nitrite: NEGATIVE
PH: 5 (ref 5.0–8.0)
PROTEIN: 30 mg/dL — AB
Specific Gravity, Urine: 1.022 (ref 1.005–1.030)
Urobilinogen, UA: 0.2 mg/dL (ref 0.0–1.0)

## 2015-01-05 LAB — CBC WITH DIFFERENTIAL/PLATELET
BASOS PCT: 0 % (ref 0–1)
Basophils Absolute: 0 10*3/uL (ref 0.0–0.1)
EOS ABS: 0 10*3/uL (ref 0.0–0.7)
Eosinophils Relative: 0 % (ref 0–5)
HEMATOCRIT: 44.1 % (ref 39.0–52.0)
HEMOGLOBIN: 14.1 g/dL (ref 13.0–17.0)
LYMPHS PCT: 3 % — AB (ref 12–46)
Lymphs Abs: 0.4 10*3/uL — ABNORMAL LOW (ref 0.7–4.0)
MCH: 22.8 pg — AB (ref 26.0–34.0)
MCHC: 32 g/dL (ref 30.0–36.0)
MCV: 71.2 fL — ABNORMAL LOW (ref 78.0–100.0)
Monocytes Absolute: 0.3 10*3/uL (ref 0.1–1.0)
Monocytes Relative: 2 % — ABNORMAL LOW (ref 3–12)
NEUTROS ABS: 12.9 10*3/uL — AB (ref 1.7–7.7)
Neutrophils Relative %: 95 % — ABNORMAL HIGH (ref 43–77)
Platelets: 188 10*3/uL (ref 150–400)
RBC: 6.19 MIL/uL — ABNORMAL HIGH (ref 4.22–5.81)
RDW: 14.1 % (ref 11.5–15.5)
WBC: 13.6 10*3/uL — ABNORMAL HIGH (ref 4.0–10.5)

## 2015-01-05 LAB — URINE MICROSCOPIC-ADD ON

## 2015-01-05 LAB — COMPREHENSIVE METABOLIC PANEL
ALK PHOS: 102 U/L (ref 39–117)
ALT: 17 U/L (ref 0–53)
AST: 20 U/L (ref 0–37)
Albumin: 4.2 g/dL (ref 3.5–5.2)
Anion gap: 8 (ref 5–15)
BILIRUBIN TOTAL: 0.9 mg/dL (ref 0.3–1.2)
BUN: 25 mg/dL — AB (ref 6–23)
CHLORIDE: 105 meq/L (ref 96–112)
CO2: 25 mmol/L (ref 19–32)
Calcium: 9.1 mg/dL (ref 8.4–10.5)
Creatinine, Ser: 1.63 mg/dL — ABNORMAL HIGH (ref 0.50–1.35)
GFR calc non Af Amer: 38 mL/min — ABNORMAL LOW (ref >90)
GFR, EST AFRICAN AMERICAN: 45 mL/min — AB (ref >90)
GLUCOSE: 189 mg/dL — AB (ref 70–99)
POTASSIUM: 4.4 mmol/L (ref 3.5–5.1)
Sodium: 138 mmol/L (ref 135–145)
Total Protein: 7.6 g/dL (ref 6.0–8.3)

## 2015-01-05 LAB — LIPASE, BLOOD: Lipase: 76 U/L — ABNORMAL HIGH (ref 11–59)

## 2015-01-05 MED ORDER — ONDANSETRON HCL 4 MG/2ML IJ SOLN
4.0000 mg | Freq: Once | INTRAMUSCULAR | Status: AC
  Administered 2015-01-05: 4 mg via INTRAVENOUS
  Filled 2015-01-05: qty 2

## 2015-01-05 MED ORDER — SODIUM CHLORIDE 0.9 % IV BOLUS (SEPSIS)
1000.0000 mL | Freq: Once | INTRAVENOUS | Status: AC
  Administered 2015-01-05: 1000 mL via INTRAVENOUS

## 2015-01-05 NOTE — ED Provider Notes (Signed)
CSN: [id number]     Arrival date & time 01/05/15  1832 History   First MD Initiated Contact with Patient 01/05/15 2224     Chief Complaint  Patient presents with  . Nausea  . Weakness     Patient is a 81 y.o. male presenting with weakness. The history is provided by the patient and the spouse. No language interpreter was used.  Weakness   [name] [name] presents for evaluation of vomiting and diarrhea. He was in his usual state of health until last night when he developed nausea, vomiting, diarrhea. He's had numerous episodes of diarrhea and a few episodes of vomiting to the point that he dry heaves. He had left lower quadrant abdominal pain last night that has improved today. He has been very fatigued today and has generalized weakness. He's been only able to drink a very small amount of fluids. Denies any fevers, chest pain, dysuria. He has no sick contacts. He has a history of prostate surgery. No recent antibiotic use.  Past Medical History  Diagnosis Date  . Hypertension   . Stone, kidney   . Stone, kidney   . Cancer     prostate  . Gallstones    Past Surgical History  Procedure Laterality Date  . Prostate surgery     Family History  Problem Relation Age of Onset  . Lung cancer Sister    History  Substance Use Topics  . Smoking status: Former Smoker -- 0.50 packs/day for 20 years    Quit date: 12/02/1972  . Smokeless tobacco: Never Used  . Alcohol Use: No    Review of Systems  Neurological: Positive for weakness.  All other systems reviewed and are negative.     Allergies  Review of patient's allergies indicates no known allergies.  Home Medications   Prior to Admission medications   Medication Sig Start Date End Date Taking? Authorizing Provider  acetaminophen (TYLENOL) 500 MG tablet Take 500 mg by mouth every 6 (six) hours as needed for mild pain.   Yes Historical Provider, MD  amLODipine (NORVASC) 10 MG tablet Take 10 mg by mouth daily.     Yes Historical  Provider, MD  aspirin 81 MG tablet Take 81 mg by mouth daily.   Yes Historical Provider, MD  cetirizine (ZYRTEC) 10 MG tablet Take 1 tablet (10 mg total) by mouth daily. One tab daily for allergies 09/16/13  Yes Billy Fischer, MD  fluticasone [facility]) 50 MCG/ACT nasal spray Place 2 sprays into the nose 2 (two) times daily. 09/16/13  Yes Billy Fischer, MD   BP 152/83 mmHg  Pulse 89  Temp(Src) 98.2 F (36.8 C) (Oral)  Resp 15  SpO2 98% Physical Exam  Constitutional: He is oriented to person, place, and time. He appears well-developed and well-nourished.  HENT:  Head: Normocephalic and atraumatic.  Cardiovascular: Normal rate and regular rhythm.   No murmur heard. Pulmonary/Chest: Effort normal and breath sounds normal. No respiratory distress.  Abdominal: Soft. There is no tenderness. There is no rebound and no guarding.  Musculoskeletal: He exhibits no tenderness.  Trace pitting edema in BLE  Neurological: He is alert and oriented to person, place, and time.  Skin: Skin is warm and dry.  Psychiatric: He has a normal mood and affect. His behavior is normal.  Nursing note and vitals reviewed.   ED Course  Procedures (including critical care time) Labs Review Labs Reviewed  CBC WITH DIFFERENTIAL - Abnormal; Notable for the following:  WBC 13.6 (*)    RBC 6.19 (*)    MCV 71.2 (*)    MCH 22.8 (*)    Neutrophils Relative % 95 (*)    Lymphocytes Relative 3 (*)    Monocytes Relative 2 (*)    Neutro Abs 12.9 (*)    Lymphs Abs 0.4 (*)    All other components within normal limits  COMPREHENSIVE METABOLIC PANEL - Abnormal; Notable for the following:    Glucose, Bld 189 (*)    BUN 25 (*)    Creatinine, Ser 1.63 (*)    GFR calc non Af Amer 38 (*)    GFR calc Af Amer 45 (*)    All other components within normal limits  LIPASE, BLOOD - Abnormal; Notable for the following:    Lipase 76 (*)    All other components within normal limits  URINALYSIS, ROUTINE W REFLEX MICROSCOPIC     Imaging Review No results found.   EKG Interpretation None      MDM   Final diagnoses:  Nausea vomiting and diarrhea    Patient here for evaluation of nausea, vomiting, and diarrhea. Patient has some left-sided abdominal tenderness yesterday which is improved today. CT scan obtained to evaluate for diverticulitis. BMP with stable renal insufficiency. UA not consistent with UTI.  Pt improved on recheck and tolerating oral fluids without difficulty. Plan to d/c home with close pcp follow up.  Return precautions were discussed.     Quintella Reichert, MD 01/08/15 [phone]

## 2015-01-05 NOTE — ED Notes (Signed)
Pt reports onset N/V/D last night, had about 6 episodes over night, and about 6 episodes each of emesis and diarrhea today. Pt reports generalized weakness. States he has been able to drink some water and gatorade today but has diarrhea soon after. Pt ambulatory with steady gait to and from bathroom. Denies feeling nauseous at this time. Denies pain. Abdomen nontender. NAD noted.

## 2015-01-05 NOTE — ED Notes (Addendum)
Pt states he has been having n/v/d thats started last night, has not been feeling good as well. Pt c/o fatigue has been sleeping all day.

## 2015-01-06 MED ORDER — ONDANSETRON HCL 4 MG PO TABS: 4.0000 mg | ORAL_TABLET | Freq: Four times a day (QID) | ORAL | Status: DC

## 2015-01-06 NOTE — Discharge Instructions (Signed)
Your CT scan shows multiple cysts on your kidneys.  Please follow up with your family doctor regarding this.    Viral Gastroenteritis Viral gastroenteritis is also known as stomach flu. This condition affects the stomach and intestinal tract. It can cause sudden diarrhea and vomiting. The illness typically lasts 3 to 8 days. Most people develop an immune response that eventually gets rid of the virus. While this natural response develops, the virus can make you quite ill. CAUSES  Many different viruses can cause gastroenteritis, such as rotavirus or noroviruses. You can catch one of these viruses by consuming contaminated food or water. You may also catch a virus by sharing utensils or other personal items with an infected person or by touching a contaminated surface. SYMPTOMS  The most common symptoms are diarrhea and vomiting. These problems can cause a severe loss of body fluids (dehydration) and a body salt (electrolyte) imbalance. Other symptoms may include:  Fever.  Headache.  Fatigue.  Abdominal pain. DIAGNOSIS  Your caregiver can usually diagnose viral gastroenteritis based on your symptoms and a physical exam. A stool sample may also be taken to test for the presence of viruses or other infections. TREATMENT  This illness typically goes away on its own. Treatments are aimed at rehydration. The most serious cases of viral gastroenteritis involve vomiting so severely that you are not able to keep fluids down. In these cases, fluids must be given through an intravenous line (IV). HOME CARE INSTRUCTIONS   Drink enough fluids to keep your urine clear or pale yellow. Drink small amounts of fluids frequently and increase the amounts as tolerated.  Ask your caregiver for specific rehydration instructions.  Avoid:  Foods high in sugar.  Alcohol.  Carbonated drinks.  Tobacco.  Juice.  Caffeine drinks.  Extremely hot or cold fluids.  Fatty, greasy foods.  Too much intake of  anything at one time.  Dairy products until 24 to 48 hours after diarrhea stops.  You may consume probiotics. Probiotics are active cultures of beneficial bacteria. They may lessen the amount and number of diarrheal stools in adults. Probiotics can be found in yogurt with active cultures and in supplements.  Wash your hands well to avoid spreading the virus.  Only take over-the-counter or prescription medicines for pain, discomfort, or fever as directed by your caregiver. Do not give aspirin to children. Antidiarrheal medicines are not recommended.  Ask your caregiver if you should continue to take your regular prescribed and over-the-counter medicines.  Keep all follow-up appointments as directed by your caregiver. SEEK IMMEDIATE MEDICAL CARE IF:   You are unable to keep fluids down.  You do not urinate at least once every 6 to 8 hours.  You develop shortness of breath.  You notice blood in your stool or vomit. This may look like coffee grounds.  You have abdominal pain that increases or is concentrated in one small area (localized).  You have persistent vomiting or diarrhea.  You have a fever.  The patient is a child younger than 3 months, and he or she has a fever.  The patient is a child older than 3 months, and he or she has a fever and persistent symptoms.  The patient is a child older than 3 months, and he or she has a fever and symptoms suddenly get worse.  The patient is a baby, and he or she has no tears when crying. MAKE SURE YOU:   Understand these instructions.  Will watch your condition.  Will  get help right away if you are not doing well or get worse. Document Released: 12/10/2005 Document Revised: 03/03/2012 Document Reviewed: 09/26/2011 [facility] Patient Information 2015 [place], [place]. This information is not intended to replace advice given to you by your health care provider. Make sure you discuss any questions you have with your health care  provider.

## 2015-03-27 ENCOUNTER — Emergency Department (HOSPITAL_COMMUNITY): Payer: Medicare Other

## 2015-03-27 ENCOUNTER — Observation Stay (HOSPITAL_COMMUNITY)
Admission: EM | Admit: 2015-03-27 | Discharge: 2015-03-28 | Disposition: A | Discharge status: Home / Self Care | Payer: Medicare Other | Attending: Internal Medicine | Admitting: Internal Medicine

## 2015-03-27 ENCOUNTER — Encounter (HOSPITAL_COMMUNITY): Payer: Self-pay | Admitting: *Deleted

## 2015-03-27 DIAGNOSIS — I129 Hypertensive chronic kidney disease with stage 1 through stage 4 chronic kidney disease, or unspecified chronic kidney disease: Secondary | ICD-10-CM | POA: Diagnosis not present

## 2015-03-27 DIAGNOSIS — Z8546 Personal history of malignant neoplasm of prostate: Secondary | ICD-10-CM | POA: Diagnosis not present

## 2015-03-27 DIAGNOSIS — R55 Syncope and collapse: Secondary | ICD-10-CM | POA: Diagnosis not present

## 2015-03-27 DIAGNOSIS — Z87891 Personal history of nicotine dependence: Secondary | ICD-10-CM | POA: Insufficient documentation

## 2015-03-27 DIAGNOSIS — D638 Anemia in other chronic diseases classified elsewhere: Secondary | ICD-10-CM | POA: Diagnosis not present

## 2015-03-27 DIAGNOSIS — N184 Chronic kidney disease, stage 4 (severe): Secondary | ICD-10-CM | POA: Diagnosis present

## 2015-03-27 DIAGNOSIS — I10 Essential (primary) hypertension: Secondary | ICD-10-CM | POA: Diagnosis not present

## 2015-03-27 DIAGNOSIS — Z7982 Long term (current) use of aspirin: Secondary | ICD-10-CM | POA: Insufficient documentation

## 2015-03-27 DIAGNOSIS — N179 Acute kidney failure, unspecified: Secondary | ICD-10-CM | POA: Diagnosis present

## 2015-03-27 LAB — BASIC METABOLIC PANEL
ANION GAP: 10 (ref 5–15)
BUN: 27 mg/dL — ABNORMAL HIGH (ref 6–23)
CALCIUM: 9.2 mg/dL (ref 8.4–10.5)
CHLORIDE: 104 mmol/L (ref 96–112)
CO2: 25 mmol/L (ref 19–32)
CREATININE: 2.45 mg/dL — AB (ref 0.50–1.35)
GFR calc Af Amer: 27 mL/min — ABNORMAL LOW (ref >90)
GFR calc non Af Amer: 23 mL/min — ABNORMAL LOW (ref >90)
GLUCOSE: 145 mg/dL — AB (ref 70–99)
POTASSIUM: 4.2 mmol/L (ref 3.5–5.1)
Sodium: 139 mmol/L (ref 135–145)

## 2015-03-27 LAB — CBG MONITORING, ED: GLUCOSE-CAPILLARY: 144 mg/dL — AB (ref 70–99)

## 2015-03-27 LAB — CBC
HCT: 40.4 % (ref 39.0–52.0)
Hemoglobin: 12.7 g/dL — ABNORMAL LOW (ref 13.0–17.0)
MCH: 22.5 pg — ABNORMAL LOW (ref 26.0–34.0)
MCHC: 31.4 g/dL (ref 30.0–36.0)
MCV: 71.5 fL — ABNORMAL LOW (ref 78.0–100.0)
Platelets: 201 10*3/uL (ref 150–400)
RBC: 5.65 MIL/uL (ref 4.22–5.81)
RDW: 13.8 % (ref 11.5–15.5)
WBC: 10.5 10*3/uL (ref 4.0–10.5)

## 2015-03-27 LAB — I-STAT TROPONIN, ED: Troponin i, poc: 0 ng/mL (ref 0.00–0.08)

## 2015-03-27 LAB — BRAIN NATRIURETIC PEPTIDE: B Natriuretic Peptide: 35.6 pg/mL (ref 0.0–100.0)

## 2015-03-27 MED ORDER — ACETAMINOPHEN 325 MG PO TABS: 650.0000 mg | ORAL_TABLET | Freq: Four times a day (QID) | ORAL | Status: DC | PRN

## 2015-03-27 MED ORDER — SODIUM CHLORIDE 0.9 % IV SOLN
INTRAVENOUS | Status: AC
  Administered 2015-03-27: 18:00:00 via INTRAVENOUS

## 2015-03-27 MED ORDER — AMLODIPINE BESYLATE 10 MG PO TABS
10.0000 mg | ORAL_TABLET | Freq: Every day | ORAL | Status: DC
  Administered 2015-03-28: 10 mg via ORAL
  Filled 2015-03-27: qty 1

## 2015-03-27 MED ORDER — SODIUM CHLORIDE 0.9 % IJ SOLN: 3.0000 mL | Freq: Two times a day (BID) | INTRAMUSCULAR | Status: DC

## 2015-03-27 MED ORDER — ONDANSETRON HCL 4 MG/2ML IJ SOLN: 4.0000 mg | Freq: Four times a day (QID) | INTRAMUSCULAR | Status: DC | PRN

## 2015-03-27 MED ORDER — SODIUM CHLORIDE 0.9 % IV BOLUS (SEPSIS)
500.0000 mL | Freq: Once | INTRAVENOUS | Status: DC
  Administered 2015-03-27: 500 mL via INTRAVENOUS

## 2015-03-27 MED ORDER — ACETAMINOPHEN 650 MG RE SUPP: 650.0000 mg | Freq: Four times a day (QID) | RECTAL | Status: DC | PRN

## 2015-03-27 MED ORDER — ONDANSETRON HCL 4 MG PO TABS: 4.0000 mg | ORAL_TABLET | Freq: Four times a day (QID) | ORAL | Status: DC | PRN

## 2015-03-27 MED ORDER — ASPIRIN 81 MG PO CHEW
81.0000 mg | CHEWABLE_TABLET | Freq: Every day | ORAL | Status: DC
  Administered 2015-03-28: 81 mg via ORAL
  Filled 2015-03-27: qty 1

## 2015-03-27 NOTE — Progress Notes (Signed)
Saw pt's family in waiting area (they are also my family members). [name] [name]'s wife told me which room pt was in and why they were here. W/nurse's permission took family members to pt room. Pt explained he "kind of went out" during church service today. Pt's wife, sister-in-law and brother-in-law were bedside. Had prayer w/pt and family. They were grateful for visit and prayer. Ernest Haber Chaplain   03/27/15 1700  Clinical Encounter Type  Visited With Patient and family together

## 2015-03-27 NOTE — ED Notes (Signed)
Bed: [id number] Expected date:  Expected time:  Means of arrival:  Comments: Ems syncope

## 2015-03-27 NOTE — ED Notes (Signed)
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

## 2015-03-27 NOTE — ED Provider Notes (Signed)
CSN: [id number]     Arrival date & time 03/27/15  1337 History   First MD Initiated Contact with Patient 03/27/15 1347     Chief Complaint  Patient presents with  . Loss of Consciousness     (Consider location/radiation/quality/duration/timing/severity/associated sxs/prior Treatment) Patient is a 81 y.o. male presenting with syncope. The history is provided by the patient.  Loss of Consciousness Episode history:  Single Most recent episode:  Today Duration:  1 minute Timing: once. Progression:  Resolved Chronicity:  New Context comment:  Was handing out communion at church Witnessed: no   Relieved by:  Nothing Worsened by:  Nothing tried Associated symptoms: no difficulty breathing, no fever, no shortness of breath and no vomiting     Past Medical History  Diagnosis Date  . Hypertension   . Stone, kidney   . Stone, kidney   . Cancer     prostate  . Gallstones    Past Surgical History  Procedure Laterality Date  . Prostate surgery     Family History  Problem Relation Age of Onset  . Lung cancer Sister    History  Substance Use Topics  . Smoking status: Former Smoker -- 0.50 packs/day for 20 years    Quit date: 12/02/1972  . Smokeless tobacco: Never Used  . Alcohol Use: No    Review of Systems  Constitutional: Negative for fever.  Respiratory: Negative for cough and shortness of breath.   Cardiovascular: Positive for syncope.  Gastrointestinal: Negative for vomiting and abdominal pain.  All other systems reviewed and are negative.     Allergies  Review of patient's allergies indicates no known allergies.  Home Medications   Prior to Admission medications   Medication Sig Start Date End Date Taking? Authorizing Provider  acetaminophen (TYLENOL) 500 MG tablet Take 500 mg by mouth every 6 (six) hours as needed for mild pain.   Yes Historical Provider, MD  amLODipine (NORVASC) 10 MG tablet Take 10 mg by mouth daily.     Yes Historical Provider, MD   aspirin 81 MG tablet Take 81 mg by mouth daily.   Yes Historical Provider, MD  Cyanocobalamin (VITAMIN B 12 PO) Take 1 tablet by mouth daily.   Yes Historical Provider, MD  Multiple Vitamins-Minerals (MULTIVITAMIN WITH MINERALS) tablet Take 1 tablet by mouth daily.   Yes Historical Provider, MD  cetirizine (ZYRTEC) 10 MG tablet Take 1 tablet (10 mg total) by mouth daily. One tab daily for allergies Patient not taking: Reported on 03/27/2015 09/16/13   Billy Fischer, MD  fluticasone [facility]) 50 MCG/ACT nasal spray Place 2 sprays into the nose 2 (two) times daily. Patient not taking: Reported on 03/27/2015 09/16/13   Billy Fischer, MD  ondansetron (ZOFRAN) 4 MG tablet Take 1 tablet (4 mg total) by mouth every 6 (six) hours. Patient not taking: Reported on 03/27/2015 01/06/15   Quintella Reichert, MD   BP 122/74 mmHg  Temp(Src) 97.9 F (36.6 C) (Oral)  Resp 18  SpO2 98% Physical Exam  Constitutional: He is oriented to person, place, and time. He appears well-developed and well-nourished. No distress.  HENT:  Head: Normocephalic and atraumatic.  Mouth/Throat: No oropharyngeal exudate.  Eyes: EOM are normal. Pupils are equal, round, and reactive to light.  Neck: Normal range of motion. Neck supple.  Cardiovascular: Normal rate and regular rhythm.  Exam reveals no friction rub.   No murmur heard. Pulmonary/Chest: Effort normal and breath sounds normal. No respiratory distress. He has no wheezes. He  has no rales.  Abdominal: He exhibits no distension. There is no tenderness. There is no rebound.  Musculoskeletal: Normal range of motion. He exhibits no edema.  Neurological: He is alert and oriented to person, place, and time.  Skin: He is not diaphoretic.  Nursing note and vitals reviewed.   ED Course  Procedures (including critical care time) Labs Review Labs Reviewed  CBC - Abnormal; Notable for the following:    Hemoglobin 12.7 (*)    MCV 71.5 (*)    MCH 22.5 (*)    All other components  within normal limits  BASIC METABOLIC PANEL - Abnormal; Notable for the following:    Glucose, Bld 145 (*)    BUN 27 (*)    Creatinine, Ser 2.45 (*)    GFR calc non Af Amer 23 (*)    GFR calc Af Amer 27 (*)    All other components within normal limits  CBG MONITORING, ED - Abnormal; Notable for the following:    Glucose-Capillary 144 (*)    All other components within normal limits  BRAIN NATRIURETIC PEPTIDE  I-STAT TROPOININ, ED    Imaging Review Dg Chest 2 View  03/27/2015   CLINICAL DATA:  Syncopal episode with urinary incontinence. History of controlled hypertension and prostate cancer. Ex-smoker. Initial encounter.  EXAM: CHEST  2 VIEW  COMPARISON:  Radiographs 12/02/2011.  Abdominal CT 01/05/2015.  FINDINGS: Stable mild left hemidiaphragm elevation/ eventration. The heart size and mediastinal contours are stable. The lungs are clear. There is no pleural effusion or pneumothorax. No acute osseous findings demonstrated. Telemetry leads overlie the chest.  IMPRESSION: Stable chest.  No active cardiopulmonary process.   Electronically Signed   By: Richardean Sale M.D.   On: 03/27/2015 14:29     EKG Interpretation   Date/Time:  Sunday March 27 2015 13:58:23 EDT Ventricular Rate:  64 PR Interval:  167 QRS Duration: 100 QT Interval:  416 QTC Calculation: 429 R Axis:   1 Text Interpretation:  Sinus rhythm Ventricular premature complex No  significant change since last tracing Confirmed by Mingo Amber  MD, [place]  ([id number]) on 03/27/2015 3:06:47 PM      MDM   Final diagnoses:  Syncope  Acute renal failure, unspecified acute renal failure type    73M here with syncope. Was preaching at church and doing communion. Didn't feel it coming on. Didn't hit his head. No prior CP, SOB. Had been acting normal. Has mild dementia per wife. Here acting appropriate, well-appearing, relaxing comfortably. Neuro exam benign. EKG ok. Labs show ARF, creatinine 2.45. Highest in past several months  1.88. Plan for admission for his syncope.    Evelina Bucy, MD 03/27/15 [phone]

## 2015-03-27 NOTE — H&P (Signed)
[facility] History and Physical  Ralston Kellas [id number] DOB: 10-27-1933 DOA: 03/27/2015  Referring physician: ER physician PCP: PROVIDER NOT IN SYSTEM   Chief Complaint: syncope   HPI:  81 year old male with past medical history of hypertension, chronic kidney disease who presented to [facility] ED after he had a brief episode of loss of consciousness. This has happened the day prior to the admission. Patient does not recall exactly events that led to the syncope. He reports he did not have chest pain or lightheadedness or shortness of breath. At this time he reports feeling fine with no dizziness or lightheadedness. No abdominal pain, nausea or vomiting. No fevers or chills. No constipation or diarrhea. No urinary complaints.  On admission, patient was hemodynamically stable. The 12-lead EKG showed sinus rhythm. Blood work was notable for creatinine of 2.45, baseline 1.8 in 2012. He was admitted for further evaluation and management of syncope.  Assessment & Plan    Principal Problem:   Syncope - Unclear etiology. Possible vasovagal. Patient does not have any complaints of chest pain. He feels fine at this time. - Obtain 2-D echo - Continue IV fluids. - Physical therapy evaluation  Active Problems:   HTN (hypertension) - Resume home medications    CKD (chronic kidney disease) stage 4, GFR 15-29 ml/min - As mentioned above, baseline creatinine 1.8 and on this admission 2.45. - Will give IV fluids and follow-up BMP tomorrow morning.    Anemia of chronic disease - Secondary to history of chronic kidney disease. Hemoglobin 12.7 stable.    DVT prophylaxis - SCDs bilaterally.   Radiological Exams on Admission: Dg Chest 2 View 03/27/2015  Stable chest.  No active cardiopulmonary process.   Electronically Signed   By: Richardean Sale M.D.   On: 03/27/2015 14:29    EKG: sinus rhythm   Code Status: Full Family Communication: Plan of care discussed with the patient  Disposition  Plan: Admit for further evaluation, telemetry floor   Leisa Lenz, MD  [facility] Pager [phone]  Review of Systems:  Constitutional: Negative for fever, chills and malaise/fatigue. Negative for diaphoresis.  HENT: Negative for hearing loss, ear pain, nosebleeds, congestion, sore throat, neck pain, tinnitus and ear discharge.   Eyes: Negative for blurred vision, double vision, photophobia, pain, discharge and redness.  Respiratory: Negative for cough, hemoptysis, sputum production, shortness of breath, wheezing and stridor.   Cardiovascular: Negative for chest pain, palpitations, orthopnea, claudication and leg swelling.  Gastrointestinal: Negative for nausea, vomiting and abdominal pain. Negative for heartburn, constipation, blood in stool and melena.  Genitourinary: Negative for dysuria, urgency, frequency, hematuria and flank pain.  Musculoskeletal: Negative for myalgias, back pain, joint pain and falls.  Skin: Negative for itching and rash.  Neurological: per HPI Endo/Heme/Allergies: Negative for environmental allergies and polydipsia. Does not bruise/bleed easily.  Psychiatric/Behavioral: Negative for suicidal ideas. The patient is not nervous/anxious.      Past Medical History  Diagnosis Date  . Hypertension   . Stone, kidney   . Stone, kidney   . Cancer     prostate  . Gallstones    Past Surgical History  Procedure Laterality Date  . Prostate surgery     Social History:  reports that he quit smoking about 42 years ago. He has never used smokeless tobacco. He reports that he does not drink alcohol or use illicit drugs.  No Known Allergies  Family History:  Family History  Problem Relation Age of Onset  . Lung cancer Sister  Prior to Admission medications   Medication Sig Start Date End Date Taking? Authorizing Provider  acetaminophen (TYLENOL) 500 MG tablet Take 500 mg by mouth every 6 (six) hours as needed for mild pain.   Yes Historical Provider, MD   amLODipine (NORVASC) 10 MG tablet Take 10 mg by mouth daily.     Yes Historical Provider, MD  aspirin 81 MG tablet Take 81 mg by mouth daily.   Yes Historical Provider, MD  Cyanocobalamin (VITAMIN B 12 PO) Take 1 tablet by mouth daily.   Yes Historical Provider, MD  Multiple Vitamins-Minerals (MULTIVITAMIN WITH MINERALS) tablet Take 1 tablet by mouth daily.   Yes Historical Provider, MD  cetirizine (ZYRTEC) 10 MG tablet Take 1 tablet (10 mg total) by mouth daily. One tab daily for allergies Patient not taking: Reported on 03/27/2015 09/16/13   Billy Fischer, MD  fluticasone [facility]) 50 MCG/ACT nasal spray Place 2 sprays into the nose 2 (two) times daily. Patient not taking: Reported on 03/27/2015 09/16/13   Billy Fischer, MD  ondansetron (ZOFRAN) 4 MG tablet Take 1 tablet (4 mg total) by mouth every 6 (six) hours. Patient not taking: Reported on 03/27/2015 01/06/15   Quintella Reichert, MD   Physical Exam: Filed Vitals:   03/27/15 1341 03/27/15 1349  BP:  122/74  Temp:  97.9 F (36.6 C)  TempSrc:  Oral  Resp:  18  SpO2: 98%     Physical Exam  Constitutional: Appears well-developed and well-nourished. No distress.  HENT: Normocephalic. No tonsillar erythema or exudates Eyes: Conjunctivae and EOM are normal. PERRLA, no scleral icterus.  Neck: Normal ROM. Neck supple. No JVD. No tracheal deviation. No thyromegaly.  CVS: RRR, S1/S2 +, no murmurs, no gallops, no carotid bruit.  Pulmonary: Effort and breath sounds normal, no stridor, rhonchi, wheezes, rales.  Abdominal: Soft. BS +,  no distension, tenderness, rebound or guarding.  Musculoskeletal: Normal range of motion. No edema and no tenderness.  Lymphadenopathy: No lymphadenopathy noted, cervical, inguinal. Neuro: Alert. Normal reflexes, muscle tone coordination. No focal neurologic deficits. Skin: Skin is warm and dry. No rash noted.  No erythema. No pallor.  Psychiatric: Normal mood and affect. Behavior, judgment, thought content normal.    Labs on Admission:  Basic Metabolic Panel:  Recent Labs Lab 03/27/15 1424  NA 139  K 4.2  CL 104  CO2 25  GLUCOSE 145*  BUN 27*  CREATININE 2.45*  CALCIUM 9.2   Liver Function Tests: No results for input(s): AST, ALT, ALKPHOS, BILITOT, PROT, ALBUMIN in the last 168 hours. No results for input(s): LIPASE, AMYLASE in the last 168 hours. No results for input(s): AMMONIA in the last 168 hours. CBC:  Recent Labs Lab 03/27/15 1424  WBC 10.5  HGB 12.7*  HCT 40.4  MCV 71.5*  PLT 201   Cardiac Enzymes: No results for input(s): CKTOTAL, CKMB, CKMBINDEX, TROPONINI in the last 168 hours. BNP: Invalid input(s): POCBNP CBG:  Recent Labs Lab 03/27/15 1433  GLUCAP 144*    If 7PM-7AM, please contact night-coverage [email or web address] Password [facility] 03/27/2015, 3:33 PM

## 2015-03-27 NOTE — ED Notes (Signed)
Family at bedside. 

## 2015-03-27 NOTE — ED Notes (Signed)
Patient transported to X-ray 

## 2015-03-28 DIAGNOSIS — R55 Syncope and collapse: Secondary | ICD-10-CM | POA: Diagnosis not present

## 2015-03-28 DIAGNOSIS — N179 Acute kidney failure, unspecified: Secondary | ICD-10-CM | POA: Insufficient documentation

## 2015-03-28 DIAGNOSIS — D638 Anemia in other chronic diseases classified elsewhere: Secondary | ICD-10-CM | POA: Diagnosis not present

## 2015-03-28 DIAGNOSIS — I10 Essential (primary) hypertension: Secondary | ICD-10-CM | POA: Diagnosis not present

## 2015-03-28 LAB — COMPREHENSIVE METABOLIC PANEL
ALBUMIN: 3.6 g/dL (ref 3.5–5.2)
ALK PHOS: 79 U/L (ref 39–117)
ALT: 15 U/L (ref 0–53)
AST: 14 U/L (ref 0–37)
Anion gap: 8 (ref 5–15)
BILIRUBIN TOTAL: 0.5 mg/dL (ref 0.3–1.2)
BUN: 24 mg/dL — ABNORMAL HIGH (ref 6–23)
CHLORIDE: 105 mmol/L (ref 96–112)
CO2: 27 mmol/L (ref 19–32)
Calcium: 8.9 mg/dL (ref 8.4–10.5)
Creatinine, Ser: 1.85 mg/dL — ABNORMAL HIGH (ref 0.50–1.35)
GFR calc Af Amer: 38 mL/min — ABNORMAL LOW (ref >90)
GFR calc non Af Amer: 33 mL/min — ABNORMAL LOW (ref >90)
Glucose, Bld: 142 mg/dL — ABNORMAL HIGH (ref 70–99)
POTASSIUM: 4.4 mmol/L (ref 3.5–5.1)
SODIUM: 140 mmol/L (ref 135–145)
TOTAL PROTEIN: 6.6 g/dL (ref 6.0–8.3)

## 2015-03-28 LAB — CBC
HEMATOCRIT: 38.3 % — AB (ref 39.0–52.0)
Hemoglobin: 11.8 g/dL — ABNORMAL LOW (ref 13.0–17.0)
MCH: 22.2 pg — ABNORMAL LOW (ref 26.0–34.0)
MCHC: 30.8 g/dL (ref 30.0–36.0)
MCV: 72.1 fL — AB (ref 78.0–100.0)
Platelets: 175 10*3/uL (ref 150–400)
RBC: 5.31 MIL/uL (ref 4.22–5.81)
RDW: 13.9 % (ref 11.5–15.5)
WBC: 9.2 10*3/uL (ref 4.0–10.5)

## 2015-03-28 LAB — GLUCOSE, CAPILLARY: Glucose-Capillary: 139 mg/dL — ABNORMAL HIGH (ref 70–99)

## 2015-03-28 NOTE — Progress Notes (Signed)
Echocardiogram 2D Echocardiogram has been performed.  [name] [name] 03/28/2015, 1:50 PM

## 2015-03-28 NOTE — Discharge Instructions (Addendum)
Syncope °Syncope is a medical term for fainting or passing out. This means you lose consciousness and drop to the ground. People are generally unconscious for less than 5 minutes. You may have some muscle twitches for up to 15 seconds before waking up and returning to normal. Syncope occurs more often in older adults, but it can happen to anyone. While most causes of syncope are not dangerous, syncope can be a sign of a serious medical problem. It is important to seek medical care.  °CAUSES  °Syncope is caused by a sudden drop in blood flow to the brain. The specific cause is often not determined. Factors that can bring on syncope include: °· Taking medicines that lower blood pressure. °· Sudden changes in posture, such as standing up quickly. °· Taking more medicine than prescribed. °· Standing in one place for too long. °· Seizure disorders. °· Dehydration and excessive exposure to heat. °· Low blood sugar (hypoglycemia). °· Straining to have a bowel movement. °· Heart disease, irregular heartbeat, or other circulatory problems. °· Fear, emotional distress, seeing blood, or severe pain. °SYMPTOMS  °Right before fainting, you may: °· Feel dizzy or light-headed. °· Feel nauseous. °· See all white or all black in your field of vision. °· Have cold, clammy skin. °DIAGNOSIS  °Your health care provider will ask about your symptoms, perform a physical exam, and perform an electrocardiogram (ECG) to record the electrical activity of your heart. Your health care provider may also perform other heart or blood tests to determine the cause of your syncope which may include: °· Transthoracic echocardiogram (TTE). During echocardiography, sound waves are used to evaluate how blood flows through your heart. °· Transesophageal echocardiogram (TEE). °· Cardiac monitoring. This allows your health care provider to monitor your heart rate and rhythm in real time. °· Holter monitor. This is a portable device that records your  heartbeat and can help diagnose heart arrhythmias. It allows your health care provider to track your heart activity for several days, if needed. °· Stress tests by exercise or by giving medicine that makes the heart beat faster. °TREATMENT  °In most cases, no treatment is needed. Depending on the cause of your syncope, your health care provider may recommend changing or stopping some of your medicines. °HOME CARE INSTRUCTIONS °· Have someone stay with you until you feel stable. °· Do not drive, use machinery, or play sports until your health care provider says it is okay. °· Keep all follow-up appointments as directed by your health care provider. °· Lie down right away if you start feeling like you might faint. Breathe deeply and steadily. Wait until all the symptoms have passed. °· Drink enough fluids to keep your urine clear or pale yellow. °· If you are taking blood pressure or heart medicine, get up slowly and take several minutes to sit and then stand. This can reduce dizziness. °SEEK IMMEDIATE MEDICAL CARE IF:  °· You have a severe headache. °· You have unusual pain in the chest, abdomen, or back. °· You are bleeding from your mouth or rectum, or you have black or tarry stool. °· You have an irregular or very fast heartbeat. °· You have pain with breathing. °· You have repeated fainting or seizure-like jerking during an episode. °· You faint when sitting or lying down. °· You have confusion. °· You have trouble walking. °· You have severe weakness. °· You have vision problems. °If you fainted, call your local emergency services (911 in [place] Do not drive   yourself to the hospital.  °MAKE SURE YOU: °· Understand these instructions. °· Will watch your condition. °· Will get help right away if you are not doing well or get worse. °Document Released: 12/10/2005 Document Revised: 12/15/2013 Document Reviewed: 02/08/2012 °ExitCare® Patient Information ©2015 [employer]. This information is not intended to replace  advice given to you by your health care provider. Make sure you discuss any questions you have with your health care provider. ° °

## 2015-03-28 NOTE — Evaluation (Signed)
Physical Therapy Evaluation Patient Details Name: Jerred Bastone MRN: [id number] DOB: [birth year] Today's Date: 03/28/2015   History of Present Illness  81 yo male admitted after having a syncopal episode at church. Hx of HTN, prostate cancer, mild dementia.   Clinical Impression  On eval, pt was supervision level assist for mobility-walked a total of ~1000 feet with/without IV pole. Pt tolerated activity well. No overt LOB but pt was slightly unsteady at times. Cautioned pt to return to activity slowly. Will follow during hospital stay.     Follow Up Recommendations No PT follow up;Supervision for mobility/OOB (initially)    Equipment Recommendations       Recommendations for Other Services       Precautions / Restrictions Precautions Precautions: Fall Restrictions Weight Bearing Restrictions: No      Mobility  Bed Mobility Overal bed mobility: Modified Independent                Transfers Overall transfer level: Modified independent                  Ambulation/Gait Ambulation/Gait assistance: Supervision Ambulation Distance (Feet): 1000 Feet Assistive device: None (IV pole) Gait Pattern/deviations: Step-through pattern     General Gait Details: slightly unsteady at times-pt bumped into door frame when returning to room.   Stairs            Wheelchair Mobility    Modified Rankin (Stroke Patients Only)       Balance                                             Pertinent Vitals/Pain Pain Assessment: No/denies pain    Home Living Family/patient expects to be discharged to:: Private residence Living Arrangements: Spouse/significant other   Type of Home: House Home Access: Level entry     [place]: [place]: [place] - single point      Prior Function Level of Independence: Independent               Hand Dominance        Extremity/Trunk Assessment   Upper Extremity Assessment: Overall  WFL for tasks assessed           Lower Extremity Assessment: Overall WFL for tasks assessed      Cervical / Trunk Assessment: Normal  Communication   Communication: No difficulties  Cognition Arousal/Alertness: Awake/alert Behavior During Therapy: WFL for tasks assessed/performed Overall Cognitive Status: Within Functional Limits for tasks assessed                      General Comments General comments (skin integrity, edema, etc.): NO overt LOB with activity. Pt denied lightheadedness/dizziness. Slightly unsteady at times. Cautioned pt to take his time and to return to activity slowly.    Exercises        Assessment/Plan    PT Assessment Patient needs continued PT services  PT Diagnosis Difficulty walking   PT Problem List Decreased mobility;Decreased balance  PT Treatment Interventions Gait training;Functional mobility training;Therapeutic activities;Therapeutic exercise;Balance training;Patient/family education   PT Goals (Current goals can be found in the Care Plan section) Acute Rehab PT Goals Patient Stated Goal: home soon PT Goal Formulation: With patient Time For Goal Achievement: 04/11/15 Potential to Achieve Goals: Good    Frequency Min 3X/week   Barriers to discharge  Co-evaluation               End of Session Equipment Utilized During Treatment: Gait belt Activity Tolerance: Patient tolerated treatment well Patient left: in chair;with call bell/phone within reach           Time: [id number] PT Time Calculation (min) (ACUTE ONLY): 21 min   Charges:   PT Evaluation $Initial PT Evaluation Tier I: 1 Procedure     PT G Codes:        Weston Anna, MPT Pager: [phone]

## 2015-03-28 NOTE — Discharge Summary (Signed)
Physician Discharge Summary  Chantry Torchio [id number] DOB: 25-Jun-1933 DOA: 03/27/2015  PCP: PROVIDER NOT IN SYSTEM  Admit date: 03/27/2015 Discharge date: 03/28/2015  Recommendations for Outpatient Follow-up:  1. 2 D ECHO normal 2. No changes in medications during this hospitalization  Of note, I spoke with the patient's wife at extent over the phone about the results of 2 D ECHO. Initially when ECHO was done I have offered that pt be discharged and that i will give them a call about the result. She requested pt stays until ECHO read. She then got very upset that i have kept her in hospital "all this time and result was normal". She further said" he got here and nothing was done and now he is going home". I have explained to her that we have done 2 D ECHO and BNP all this was normal. He did not have syncope or dizziness at all and walked fine per RN report. I asked is she had other question but she then hung up the phone.  Discharge Diagnoses:  Principal Problem:   Syncope Active Problems:   HTN (hypertension)   CKD (chronic kidney disease) stage 4, GFR 15-29 ml/min   Anemia of chronic disease    Discharge Condition: stable   Diet recommendation: as tolerated   History of present illness:  81 year old male with past medical history of hypertension, chronic kidney disease who presented to [facility] ED after he had a brief episode of loss of consciousness. This has happened the day prior to the admission. Patient does not recall exactly events that led to the syncope. He reports he did not have chest pain or lightheadedness or shortness of breath. At this time he reports feeling fine with no dizziness or lightheadedness. No abdominal pain, nausea or vomiting. No fevers or chills. No constipation or diarrhea. No urinary complaints.  On admission, patient was hemodynamically stable. The 12-lead EKG showed sinus rhythm. Blood work was notable for creatinine of 2.45, baseline 1.8 in 2012. He was  admitted for further evaluation and management of syncope.  Assessment & Plan    Principal Problem:  Syncope - Unclear etiology. Possible vasovagal. Patient does not have any complaints of chest pain. He feels fine at this time. - 2 D ECHO normal  - Per PT - no follow up required   Active Problems:  HTN (hypertension) - Resume home medications on discharge    CKD (chronic kidney disease) stage 4, GFR 15-29 ml/min - As mentioned above, baseline creatinine 1.8 and on this admission 2.45. Creatinine has trended down to 1.85.    Anemia of chronic disease - Secondary to history of chronic kidney disease. Hemoglobin 12.7 stable.    DVT prophylaxis - SCDs bilaterally.   Radiological Exams on Admission: Dg Chest 2 View 03/27/2015 Stable chest. No active cardiopulmonary process. Electronically Signed By: Richardean Sale M.D. On: 03/27/2015 14:29   Signed:  Leisa Lenz, MD  [facility] 03/28/2015, 10:57 AM  Pager #: [phone]  Procedures:  2 D ECHO  Consultations:  None   Discharge Exam: Filed Vitals:   03/28/15 0458  BP: 156/83  Pulse: 62  Temp: 98.5 F (36.9 C)  Resp: 16   Filed Vitals:   03/27/15 1900 03/27/15 2039 03/28/15 0144 03/28/15 0458  BP: 134/70 146/79 144/92 156/83  Pulse:  64 52 62  Temp:  99 F (37.2 C) 97.9 F (36.6 C) 98.5 F (36.9 C)  TempSrc:  Oral Oral Oral  Resp:  16 16 16   Height:  Weight:    104.781 kg (231 lb)  SpO2:  99% 100% 97%    General: Pt is alert, follows commands appropriately, not in acute distress Cardiovascular: Regular rate and rhythm, S1/S2 +, no murmurs Respiratory: Clear to auscultation bilaterally, no wheezing, no crackles, no rhonchi Abdominal: Soft, non tender, non distended, bowel sounds +, no guarding Extremities: no edema, no cyanosis, pulses palpable bilaterally DP and PT Neuro: Grossly nonfocal  Discharge Instructions  Discharge Instructions    Call MD for:  difficulty  breathing, headache or visual disturbances    Complete by:  As directed      Call MD for:  persistant nausea and vomiting    Complete by:  As directed      Call MD for:  severe uncontrolled pain    Complete by:  As directed      Diet - low sodium heart healthy    Complete by:  As directed      Increase activity slowly    Complete by:  As directed             Medication List    STOP taking these medications        cetirizine 10 MG tablet  Commonly known as:  ZYRTEC      TAKE these medications        acetaminophen 500 MG tablet  Commonly known as:  TYLENOL  Take 500 mg by mouth every 6 (six) hours as needed for mild pain.     amLODipine 10 MG tablet  Commonly known as:  NORVASC  Take 10 mg by mouth daily.     aspirin 81 MG tablet  Take 81 mg by mouth daily.     fluticasone 50 MCG/ACT nasal spray  Commonly known as:  FLONASE  Place 2 sprays into the nose 2 (two) times daily.     multivitamin with minerals tablet  Take 1 tablet by mouth daily.     ondansetron 4 MG tablet  Commonly known as:  ZOFRAN  Take 1 tablet (4 mg total) by mouth every 6 (six) hours.     VITAMIN B 12 PO  Take 1 tablet by mouth daily.          The results of significant diagnostics from this hospitalization (including imaging, microbiology, ancillary and laboratory) are listed below for reference.    Significant Diagnostic Studies: Dg Chest 2 View  03/27/2015   CLINICAL DATA:  Syncopal episode with urinary incontinence. History of controlled hypertension and prostate cancer. Ex-smoker. Initial encounter.  EXAM: CHEST  2 VIEW  COMPARISON:  Radiographs 12/02/2011.  Abdominal CT 01/05/2015.  FINDINGS: Stable mild left hemidiaphragm elevation/ eventration. The heart size and mediastinal contours are stable. The lungs are clear. There is no pleural effusion or pneumothorax. No acute osseous findings demonstrated. Telemetry leads overlie the chest.  IMPRESSION: Stable chest.  No active  cardiopulmonary process.   Electronically Signed   By: Richardean Sale M.D.   On: 03/27/2015 14:29    Microbiology: No results found for this or any previous visit (from the past 240 hour(s)).   Labs: Basic Metabolic Panel:  Recent Labs Lab 03/27/15 1424 03/28/15 0502  NA 139 140  K 4.2 4.4  CL 104 105  CO2 25 27  GLUCOSE 145* 142*  BUN 27* 24*  CREATININE 2.45* 1.85*  CALCIUM 9.2 8.9   Liver Function Tests:  Recent Labs Lab 03/28/15 0502  AST 14  ALT 15  ALKPHOS 79  BILITOT 0.5  PROT 6.6  ALBUMIN 3.6   No results for input(s): LIPASE, AMYLASE in the last 168 hours. No results for input(s): AMMONIA in the last 168 hours. CBC:  Recent Labs Lab 03/27/15 1424 03/28/15 0502  WBC 10.5 9.2  HGB 12.7* 11.8*  HCT 40.4 38.3*  MCV 71.5* 72.1*  PLT 201 175   Cardiac Enzymes: No results for input(s): CKTOTAL, CKMB, CKMBINDEX, TROPONINI in the last 168 hours. BNP: BNP (last 3 results)  Recent Labs  03/27/15 1424  BNP 35.6    ProBNP (last 3 results) No results for input(s): PROBNP in the last 8760 hours.  CBG:  Recent Labs Lab 03/27/15 1433 03/28/15 0454  GLUCAP 144* 139*    Time coordinating discharge: Over 30 minutes

## 2015-03-30 NOTE — Progress Notes (Signed)
   03/28/15 1130  PT Time Calculation  PT Start Time (ACUTE ONLY) 1054  PT Stop Time (ACUTE ONLY) 1115  PT Time Calculation (min) (ACUTE ONLY) 21 min  PT G-Codes **NOT FOR INPATIENT CLASS**  Functional Assessment Tool Used (clinical judgement)  Functional Limitation Mobility: Walking and moving around  Mobility: Walking and Moving Around Current Status [id number]) CI  Mobility: Walking and Moving Around Goal Status [id number]) CH  Mobility: Walking and Moving Around Discharge Status [id number]) CI  PT General Charges  $$ ACUTE PT VISIT 1 Procedure  PT Evaluation  $Initial PT Evaluation Tier I 1 Procedure   [name] [name],MPT [phone]

## 2015-11-06 ENCOUNTER — Observation Stay (HOSPITAL_COMMUNITY)
Admission: EM | Admit: 2015-11-06 | Discharge: 2015-11-07 | Disposition: A | Discharge status: Home / Self Care | Payer: Medicare Other | Attending: Family Medicine | Admitting: Family Medicine

## 2015-11-06 ENCOUNTER — Emergency Department (HOSPITAL_COMMUNITY): Payer: Medicare Other

## 2015-11-06 ENCOUNTER — Encounter (HOSPITAL_COMMUNITY): Payer: Self-pay | Admitting: *Deleted

## 2015-11-06 DIAGNOSIS — N184 Chronic kidney disease, stage 4 (severe): Secondary | ICD-10-CM | POA: Diagnosis not present

## 2015-11-06 DIAGNOSIS — Z87891 Personal history of nicotine dependence: Secondary | ICD-10-CM | POA: Diagnosis not present

## 2015-11-06 DIAGNOSIS — E785 Hyperlipidemia, unspecified: Secondary | ICD-10-CM | POA: Insufficient documentation

## 2015-11-06 DIAGNOSIS — Z79899 Other long term (current) drug therapy: Secondary | ICD-10-CM | POA: Insufficient documentation

## 2015-11-06 DIAGNOSIS — N179 Acute kidney failure, unspecified: Secondary | ICD-10-CM | POA: Diagnosis not present

## 2015-11-06 DIAGNOSIS — E119 Type 2 diabetes mellitus without complications: Secondary | ICD-10-CM | POA: Diagnosis not present

## 2015-11-06 DIAGNOSIS — I129 Hypertensive chronic kidney disease with stage 1 through stage 4 chronic kidney disease, or unspecified chronic kidney disease: Secondary | ICD-10-CM | POA: Diagnosis not present

## 2015-11-06 DIAGNOSIS — R4189 Other symptoms and signs involving cognitive functions and awareness: Secondary | ICD-10-CM | POA: Diagnosis not present

## 2015-11-06 DIAGNOSIS — I10 Essential (primary) hypertension: Secondary | ICD-10-CM

## 2015-11-06 DIAGNOSIS — G9001 Carotid sinus syncope: Principal | ICD-10-CM | POA: Insufficient documentation

## 2015-11-06 DIAGNOSIS — Z8546 Personal history of malignant neoplasm of prostate: Secondary | ICD-10-CM | POA: Insufficient documentation

## 2015-11-06 DIAGNOSIS — R55 Syncope and collapse: Secondary | ICD-10-CM | POA: Diagnosis not present

## 2015-11-06 LAB — URINALYSIS, ROUTINE W REFLEX MICROSCOPIC
Bilirubin Urine: NEGATIVE
GLUCOSE, UA: 250 mg/dL — AB
Hgb urine dipstick: NEGATIVE
Ketones, ur: NEGATIVE mg/dL
Leukocytes, UA: NEGATIVE
Nitrite: NEGATIVE
PH: 5 (ref 5.0–8.0)
Protein, ur: NEGATIVE mg/dL
Specific Gravity, Urine: 1.02 (ref 1.005–1.030)
Urobilinogen, UA: 0.2 mg/dL (ref 0.0–1.0)

## 2015-11-06 LAB — CBC WITH DIFFERENTIAL/PLATELET
BASOS ABS: 0 10*3/uL (ref 0.0–0.1)
Basophils Relative: 0 %
Eosinophils Absolute: 0 10*3/uL (ref 0.0–0.7)
Eosinophils Relative: 0 %
HEMATOCRIT: 38 % — AB (ref 39.0–52.0)
Hemoglobin: 12 g/dL — ABNORMAL LOW (ref 13.0–17.0)
Lymphocytes Relative: 7 %
Lymphs Abs: 0.6 10*3/uL — ABNORMAL LOW (ref 0.7–4.0)
MCH: 22.6 pg — ABNORMAL LOW (ref 26.0–34.0)
MCHC: 31.6 g/dL (ref 30.0–36.0)
MCV: 71.7 fL — AB (ref 78.0–100.0)
MONO ABS: 0.5 10*3/uL (ref 0.1–1.0)
MONOS PCT: 5 %
NEUTROS ABS: 8 10*3/uL — AB (ref 1.7–7.7)
Neutrophils Relative %: 88 %
Platelets: 170 10*3/uL (ref 150–400)
RBC: 5.3 MIL/uL (ref 4.22–5.81)
RDW: 14 % (ref 11.5–15.5)
WBC: 9.1 10*3/uL (ref 4.0–10.5)

## 2015-11-06 LAB — BASIC METABOLIC PANEL
Anion gap: 10 (ref 5–15)
BUN: 28 mg/dL — AB (ref 6–20)
CO2: 24 mmol/L (ref 22–32)
Calcium: 9.1 mg/dL (ref 8.9–10.3)
Chloride: 105 mmol/L (ref 101–111)
Creatinine, Ser: 2.04 mg/dL — ABNORMAL HIGH (ref 0.61–1.24)
GFR calc Af Amer: 34 mL/min — ABNORMAL LOW (ref >60)
GFR, EST NON AFRICAN AMERICAN: 29 mL/min — AB (ref >60)
GLUCOSE: 261 mg/dL — AB (ref 65–99)
POTASSIUM: 4.3 mmol/L (ref 3.5–5.1)
Sodium: 139 mmol/L (ref 135–145)

## 2015-11-06 LAB — I-STAT TROPONIN, ED: Troponin i, poc: 0.01 ng/mL (ref 0.00–0.08)

## 2015-11-06 LAB — CBG MONITORING, ED: Glucose-Capillary: 236 mg/dL — ABNORMAL HIGH (ref 65–99)

## 2015-11-06 LAB — I-STAT CG4 LACTIC ACID, ED: Lactic Acid, Venous: 2.8 mmol/L (ref 0.5–2.0)

## 2015-11-06 MED ORDER — AMLODIPINE BESYLATE 5 MG PO TABS
5.0000 mg | ORAL_TABLET | Freq: Every day | ORAL | Status: DC
  Administered 2015-11-07: 5 mg via ORAL
  Filled 2015-11-06: qty 1

## 2015-11-06 MED ORDER — ACETAMINOPHEN 500 MG PO TABS: 500.0000 mg | ORAL_TABLET | Freq: Four times a day (QID) | ORAL | Status: DC | PRN

## 2015-11-06 MED ORDER — VITAMIN B 12 100 MCG PO LOZG: LOZENGE | Freq: Every day | ORAL | Status: DC

## 2015-11-06 MED ORDER — ADULT MULTIVITAMIN W/MINERALS CH
1.0000 | ORAL_TABLET | Freq: Every day | ORAL | Status: DC
  Administered 2015-11-07: 1 via ORAL
  Filled 2015-11-06: qty 1

## 2015-11-06 MED ORDER — ENOXAPARIN SODIUM 40 MG/0.4ML ~~LOC~~ SOLN
40.0000 mg | SUBCUTANEOUS | Status: DC
  Administered 2015-11-07: 40 mg via SUBCUTANEOUS
  Filled 2015-11-06: qty 0.4

## 2015-11-06 MED ORDER — SODIUM CHLORIDE 0.9 % IV SOLN
Freq: Once | INTRAVENOUS | Status: AC
  Administered 2015-11-06: 17:00:00 via INTRAVENOUS

## 2015-11-06 MED ORDER — ASPIRIN 81 MG PO CHEW
81.0000 mg | CHEWABLE_TABLET | Freq: Every day | ORAL | Status: DC
  Administered 2015-11-07: 81 mg via ORAL
  Filled 2015-11-06: qty 1

## 2015-11-06 MED ORDER — SODIUM CHLORIDE 0.9 % IJ SOLN
3.0000 mL | Freq: Two times a day (BID) | INTRAMUSCULAR | Status: DC
  Administered 2015-11-06 – 2015-11-07 (×2): 3 mL via INTRAVENOUS

## 2015-11-06 MED ORDER — VITAMIN B-12 100 MCG PO TABS
100.0000 ug | ORAL_TABLET | Freq: Every day | ORAL | Status: DC
  Administered 2015-11-07: 100 ug via ORAL
  Filled 2015-11-06: qty 1

## 2015-11-06 NOTE — ED Notes (Signed)
[place] from [facility] via EMS-Per EMS, pt was preaching, became pale and had a syncopal episode that lasted a few minutes. Pt family assumed that he had a hypoglycemic episode, but CBG on scene 275. Pt was given lemonade by family and last CBG was 320. Pt had pinpoint pupils and no radial pulse on scene, but en route was A&O and in NAD

## 2015-11-06 NOTE — ED Notes (Signed)
During carotid massage performed by Dr Verlon Au, pt's pulse dropped to upper 30s and BP dropped from 123456 systolic to 99991111 systolic.  No acute distress noted.

## 2015-11-06 NOTE — H&P (Signed)
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  Almost towards end sermon, looked pale and completed sermon and then had to be assisted to sitting the chair.  episodic LOC ~ 1-2 min, was a little confused No cp, no sz like activity, no tongue biting, no loss bowel/bladder continence No fever no chills CBG measured was 79 usually at home in the 130-140 range and not below  Brought to ED-noted BP was low [id number] systolic but rebounded upwards Had taken his DM mediciation-but hasn't brought it with him today.  States did not feel any vertigo, lightheadedness just felt overall "weak" generally Did not feel prodrome prior to this deficit   No f,chills,cp,n,v,diarr,dark stool,tarry stool,recentt fall,rash,dysuria,blurred nor double vision  Emergency room workup revealed a lactic acid 2.8, BUN/creatinine 28/2.04, hemoglobin 12, white count 9.1, platelet 170 glucose 261 2 view chest x-ray showed no new findings 0.8 troponin negative EKG showed normal sinus rhythm PR interval 0.12 QRS axis -10 no acute ST-T wave changes and rapid precordial transition   Used to be in the [employer] forte in the [place] War Used to be a Engineer, 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 Hypertension   . Stone, kidney   . Stone, kidney   . Cancer     prostate  . Gallstones    Past Surgical History  Procedure Laterality Date  . Prostate surgery     Social History:  Social History   Social History Narrative    No Known Allergies  Family History  Problem Relation Age of Onset  . Lung cancer Sister     Prior to Admission medications   Medication Sig Start Date End Date Taking? Authorizing Provider  acetaminophen (TYLENOL) 500 MG tablet Take 500 mg by mouth every 6 (six) hours as needed for mild pain.   Yes Historical Provider, MD  amLODipine (NORVASC) 10 MG tablet Take 10 mg by mouth daily.     Yes Historical Provider, MD  aspirin 81 MG tablet Take 81 mg by mouth daily.   Yes Historical Provider, MD  Cyanocobalamin (VITAMIN B 12 PO) Take 1 tablet by mouth daily.   Yes Historical Provider, MD  Multiple Vitamins-Minerals (MULTIVITAMIN WITH MINERALS) tablet Take 1 tablet by mouth daily.   Yes Historical Provider, MD   Physical Exam: Filed Vitals:   11/06/15 1500 11/06/15 1503 11/06/15 1530 11/06/15 1627  BP: 149/86 176/82 164/82 155/85  Pulse: 70 76 71 75  Temp:      TempSrc:      Resp: 16 17 16 16   SpO2: 99%  98% 97% 98%   On exam pleasant slightly slow to respond, no icterus no pallor mild arcus senilis dentition is moderate, Mallampati 3 No JVD, no bruit No thyromegaly Funduscopy deferred S1-S2 no murmur rub or gallop, Carotid sinus discharge showedThat patient dropped his pulse straight to the 50s and 40s and blood pressure also dropped Chest is clear with no added sound no rales no rhonchi no fremitus-on the back his chest and on his back are multiple areas of moles Abdomen soft nontender nondistended Neurologically able to lift legs extremity exam were deferred as he still wearing shoes Plantar  dorsiflexion normal, 515 power in hips and knees as well as biceps and triceps,  Labs on Admission:  Basic Metabolic Panel:  Recent Labs Lab 11/06/15 1420  NA 139  K 4.3  CL 105  CO2 24  GLUCOSE 261*  BUN 28*  CREATININE 2.04*  CALCIUM 9.1   Liver Function Tests: No results for input(s): AST, ALT, ALKPHOS, BILITOT, PROT, ALBUMIN in the last 168 hours. No results for input(s): LIPASE, AMYLASE in the last 168 hours. No results for input(s): AMMONIA in the last 168 hours. CBC:  Recent Labs Lab 11/06/15 1420  WBC 9.1  NEUTROABS 8.0*  HGB 12.0*  HCT 38.0*  MCV 71.7*  PLT 170   Cardiac Enzymes: No results for input(s): CKTOTAL, CKMB, CKMBINDEX, TROPONINI in the last 168 hours.  BNP (last 3 results)  Recent Labs  03/27/15 1424  BNP 35.6    ProBNP (last 3 results) No results for input(s): PROBNP in the last 8760 hours.  CBG:  Recent Labs Lab 11/06/15 1357  GLUCAP 236*    Radiological Exams on Admission: Dg Chest 2 View  11/06/2015  CLINICAL DATA:  Lightheadedness today, hypertension, ex-smoker. EXAM: CHEST  2 VIEW COMPARISON:  Chest x-ray dated 03/27/2015. FINDINGS: Study is hypoinspiratory with crowding of the perihilar bronchovascular markings. Given the low lung volumes, lungs appear clear. No evidence of pneumonia. No pleural effusion. No pneumothorax. Cardiomediastinal silhouette appears grossly stable in size and configuration. No acute osseous abnormality. Mild degenerative change again noted throughout the kyphotic thoracic spine. IMPRESSION: Hypoinspiratory exam. No evidence of acute cardiopulmonary abnormality Electronically Signed   By: Franki Cabot M.D.   On: 11/06/2015 14:43    EKG: Independently reviewed. c above  Assessment/Plan Active Problems:   Syncope -DDX = carotid sinus syncope/syndrome I performed carotid sinus massage on the right side for about 5 or 6 seconds and the patient had a drop in his pulse rate and drop in his blood pressure  as well From pulse rate of 80 to about 40 and blood and blood pressure from 99991111 systolic to 99991111 systolic -It appears he was wearing tighter than usual shirt and a tie at the pulpit -Patient's blood sugar also was somewhat lower than usual so he may need to discontinue his sugar medication which we do not have access to at this time and are not sure what it is -There may be some  concern for orthostasis as well so orthostatics were done but are negative -Will place on IV saline 75 cc per hour and monitor on telemetry today -Would down titrate his dosage of amlodipine  Diabetes mellitus -Unknown medication at home but we will hold off on this and encourage less stringent diabetic goals and his case and also encouraged him to Eat properly before his nextCervix    HTN (hypertension) -Changed dosing of amlodipine from 10-5 mg daily    CKD (chronic kidney disease)  stage 4, GFR 15-29 ml/min (HCC) -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 11/06/2015, 4:56 PM

## 2015-11-06 NOTE — ED Notes (Signed)
Pt still  Unable to provide urine specimen

## 2015-11-06 NOTE — ED Provider Notes (Signed)
CSN: [id number]     Arrival date & time 11/06/15  1347 History   First MD Initiated Contact with Patient 11/06/15 1405     Chief Complaint  Patient presents with  . Loss of Consciousness     The history is provided by the patient. No language interpreter was used.   [name] is an 81 year old man with history of hypertension and prostate cancer that presents for evaluation of syncope. 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 He went to sit down and collapsed to a chair.  He was noted to be  unresponsive for several minutes. Bystanders report thready radial pulses. There was concern that he had a hypoglycemic episode and they were giving him lemonade on the scene. He reports not feeling well in general today but has no specific symptoms. He denies any chest pain, shortness of breath, abdominal pain, vomiting, black or bloody stools, dysuria, leg swelling.   Past Medical History  Diagnosis Date  . Hypertension   . Stone, kidney   . Stone, kidney   . Cancer     prostate  . Gallstones    Past Surgical History  Procedure Laterality Date  . Prostate surgery     Family History  Problem Relation Age of Onset  . Lung cancer Sister    Social History  Substance Use Topics  . Smoking status: Former Smoker -- 0.50 packs/day for 20 years    Quit date: 12/02/1972  . Smokeless tobacco: Never Used  . Alcohol Use: No    Review of Systems  All other systems reviewed and are negative.     Allergies  Review of patient's allergies indicates no known allergies.  Home Medications   Prior to Admission medications   Medication Sig Start Date End Date Taking? Authorizing Provider  acetaminophen (TYLENOL) 500 MG tablet Take 500 mg by mouth every 6 (six) hours as needed for mild pain.   Yes Historical Provider, MD  amLODipine (NORVASC) 10 MG tablet Take 10 mg by mouth daily.     Yes Historical Provider, MD  aspirin 81 MG tablet Take 81 mg by mouth daily.   Yes Historical  Provider, MD  Cyanocobalamin (VITAMIN B 12 PO) Take 1 tablet by mouth daily.   Yes Historical Provider, MD  Multiple Vitamins-Minerals (MULTIVITAMIN WITH MINERALS) tablet Take 1 tablet by mouth daily.   Yes Historical Provider, MD   BP 133/74 mmHg  Pulse 60  Temp(Src) 97.4 F (36.3 C) (Oral)  Resp 19  SpO2 98% Physical Exam  Constitutional: He is oriented to person, place, and time. He appears well-developed and well-nourished.  HENT:  Head: Normocephalic and atraumatic.  Cardiovascular: Normal rate and regular rhythm.   No murmur heard. Pulmonary/Chest: Effort normal and breath sounds normal. No respiratory distress.  Abdominal: Soft. There is no tenderness. There is no rebound and no guarding.  Musculoskeletal: He exhibits no edema or tenderness.  Neurological: He is alert and oriented to person, place, and time.  Skin: Skin is warm and dry.  Psychiatric: He has a normal mood and affect. His behavior is normal.  Nursing note and vitals reviewed.   ED Course  Procedures (including critical care time) Labs Review Labs Reviewed  BASIC METABOLIC PANEL - Abnormal; Notable for the following:    Glucose, Bld 261 (*)    BUN 28 (*)    Creatinine, Ser 2.04 (*)    GFR calc non Af Amer 29 (*)    GFR calc  Af Amer 34 (*)    All other components within normal limits  CBC WITH DIFFERENTIAL/PLATELET - Abnormal; Notable for the following:    Hemoglobin 12.0 (*)    HCT 38.0 (*)    MCV 71.7 (*)    MCH 22.6 (*)    All other components within normal limits  CBG MONITORING, ED - Abnormal; Notable for the following:    Glucose-Capillary 236 (*)    All other components within normal limits  I-STAT CG4 LACTIC ACID, ED - Abnormal; Notable for the following:    Lactic Acid, Venous 2.80 (*)    All other components within normal limits  URINALYSIS, ROUTINE W REFLEX MICROSCOPIC (NOT AT [facility])  Randolm Idol, ED    Imaging Review Dg Chest 2 View  11/06/2015  CLINICAL DATA:  Lightheadedness  today, hypertension, ex-smoker. EXAM: CHEST  2 VIEW COMPARISON:  Chest x-ray dated 03/27/2015. FINDINGS: Study is hypoinspiratory with crowding of the perihilar bronchovascular markings. Given the low lung volumes, lungs appear clear. No evidence of pneumonia. No pleural effusion. No pneumothorax. Cardiomediastinal silhouette appears grossly stable in size and configuration. No acute osseous abnormality. Mild degenerative change again noted throughout the kyphotic thoracic spine. IMPRESSION: Hypoinspiratory exam. No evidence of acute cardiopulmonary abnormality Electronically Signed   By: Franki Cabot M.D.   On: 11/06/2015 14:43   I have personally reviewed and evaluated these images and lab results as part of my medical decision-making.   EKG Interpretation   Date/Time:  Sunday November 06 2015 14:04:45 EST Ventricular Rate:  60 PR Interval:  146 QRS Duration: 95 QT Interval:  426 QTC Calculation: 426 R Axis:   -6 Text Interpretation:  Sinus rhythm Atrial premature complex Nonspecific T  abnormalities, inferior leads ED PHYSICIAN INTERPRETATION AVAILABLE IN  [facility] Confirmed by TEST, Record ([id number]) on 11/06/2015 2:22:54 PM      MDM   Final diagnoses:  None    Patient here for evaluation of syncopal events. Patient did seem slightly slow to answer questions on initial ED arrival. After repeat assessment he appears much improved. He did have blood pressures in the 90s for EMS, this is resolved in emergency department. Unclear cause of syncope as patient did not have a vagal stimulus prior to events. Plan to admit for observation.    Quintella Reichert, MD 11/07/15 [phone]

## 2015-11-07 DIAGNOSIS — G9001 Carotid sinus syncope: Secondary | ICD-10-CM

## 2015-11-07 DIAGNOSIS — N184 Chronic kidney disease, stage 4 (severe): Secondary | ICD-10-CM | POA: Diagnosis not present

## 2015-11-07 DIAGNOSIS — I10 Essential (primary) hypertension: Secondary | ICD-10-CM

## 2015-11-07 DIAGNOSIS — R55 Syncope and collapse: Secondary | ICD-10-CM | POA: Diagnosis not present

## 2015-11-07 LAB — GLUCOSE, CAPILLARY
GLUCOSE-CAPILLARY: 187 mg/dL — AB (ref 65–99)
GLUCOSE-CAPILLARY: 172 mg/dL — AB (ref 65–99)

## 2015-11-07 MED ORDER — AMLODIPINE BESYLATE 5 MG PO TABS
5.0000 mg | ORAL_TABLET | Freq: Once | ORAL | Status: AC
  Administered 2015-11-07: 5 mg via ORAL
  Filled 2015-11-07: qty 1

## 2015-11-07 NOTE — Evaluation (Signed)
Physical Therapy Evaluation Patient Details Name: [name] [name] MRN: [id number] DOB: 1933-03-06 Today's Date: 11/07/2015   History of Present Illness  81 yo male admitted after having a syncopal episode at church. Per chart, previous episode 03/2015. Hx of HTN, CKD III, memory loss/mild dementia, prostate cancer, DM.   Clinical Impression  On eval, pt was supervision-min guard assist for mobility-walked ~500 feet. Pt denied lightheadedness/dizziness. Intermittent unsteadiness noted but no overt LOB. Do not anticipate any follow up PT needs at discharge.     Follow Up Recommendations No PT follow up;Supervision for mobility/OOB    Equipment Recommendations  None recommended by PT    Recommendations for Other Services       Precautions / Restrictions Precautions Precautions: Fall Restrictions Weight Bearing Restrictions: No      Mobility  Bed Mobility Overal bed mobility: Independent                Transfers Overall transfer level: Independent                  Ambulation/Gait Ambulation/Gait assistance: Min guard;Supervision Ambulation Distance (Feet): 500 Feet Assistive device: None Gait Pattern/deviations: Step-through pattern;Drifts right/left;Staggering right;Staggering left     General Gait Details: intermittent unsteadiness noted but no overt LOB. Pt denied dizziness/lightheadedness. Tolerated distance well  Stairs            Wheelchair Mobility    Modified Rankin (Stroke Patients Only)       Balance           Standing balance support: During functional activity Standing balance-Leahy Scale: Good Standing balance comment: Performed static standing with EO/EC, narrow BOS, withstanding external perturbations without difficulty. Able to turn 360 degrees, L then R, and able to pick up object without difficulty.                             Pertinent Vitals/Pain Pain Assessment: No/denies pain    Home Living  Family/patient expects to be discharged to:: Private residence Living Arrangements: Spouse/significant other   Type of Home: House Home Access: Stairs to enter   Technical brewer of Steps: 3 Home Layout: One level        Prior Function Level of Independence: Independent               Hand Dominance        Extremity/Trunk Assessment   Upper Extremity Assessment: Overall WFL for tasks assessed           Lower Extremity Assessment: Overall WFL for tasks assessed      Cervical / Trunk Assessment: Normal  Communication   Communication: No difficulties  Cognition Arousal/Alertness: Awake/alert Behavior During Therapy: WFL for tasks assessed/performed Overall Cognitive Status: Within Functional Limits for tasks assessed                      General Comments      Exercises        Assessment/Plan    PT Assessment Patient needs continued PT services  PT Diagnosis Difficulty walking   PT Problem List Decreased mobility  PT Treatment Interventions Gait training;Functional mobility training;Therapeutic activities;Therapeutic exercise;Patient/family education   PT Goals (Current goals can be found in the Care Plan section) Acute Rehab PT Goals Patient Stated Goal: home soon PT Goal Formulation: With patient Time For Goal Achievement: 11/14/15 Potential to Achieve Goals: Good    Frequency Min 3X/week   Barriers to discharge  Co-evaluation               End of Session Equipment Utilized During Treatment: Gait belt Activity Tolerance: Patient tolerated treatment well Patient left: in bed;with call bell/phone within reach      Functional Assessment Tool Used: clinical judgement Functional Limitation: Mobility: Walking and moving around Mobility: Walking and Moving Around Current Status [id number]): At least 1 percent but less than 20 percent impaired, limited or restricted Mobility: Walking and Moving Around Goal Status [phone]):  At least 1 percent but less than 20 percent impaired, limited or restricted    Time: 0925-0941 PT Time Calculation (min) (ACUTE ONLY): 16 min   Charges:   PT Evaluation $Initial PT Evaluation Tier I: 1 Procedure     PT G Codes:   PT G-Codes **NOT FOR INPATIENT CLASS** Functional Assessment Tool Used: clinical judgement Functional Limitation: Mobility: Walking and moving around Mobility: Walking and Moving Around Current Status [id number]): At least 1 percent but less than 20 percent impaired, limited or restricted Mobility: Walking and Moving Around Goal Status [phone]): At least 1 percent but less than 20 percent impaired, limited or restricted    Weston Anna, MPT Pager: [phone]

## 2015-11-07 NOTE — Care Management Note (Signed)
Case Management Note  Patient Details  Name: [name] [name] MRN: [id number] Date of Birth: 05-14-1933  Subjective/Objective:  81 y/o m admitted w/syncope. From home.                Action/Plan:d/c home no needs or orders.   Expected Discharge Date:                  Expected Discharge Plan:  Home/Self Care  In-House Referral:     Discharge planning Services  CM Consult  Post Acute Care Choice:    Choice offered to:     DME Arranged:    DME Agency:     HH Arranged:    [place] Agency:     Status of Service:  Completed, signed off  Medicare Important Message Given:    Date Medicare IM Given:    Medicare IM give by:    Date Additional Medicare IM Given:    Additional Medicare Important Message give by:     If discussed at [place] of Stay Meetings, dates discussed:    Additional Comments:  Dessa Phi, RN 11/07/2015, 2:57 PM

## 2015-11-07 NOTE — Discharge Summary (Signed)
Physician Discharge Summary  [name] [name] [id number] DOB: [birth year] DOA: 11/06/2015  PCP: PROVIDER NOT IN SYSTEM  Admit date: 11/06/2015 Discharge date: 11/07/2015  Time spent: 25 minutes  Recommendations for Outpatient Follow-up:  1. No DM meds until [facility] by [facility] 2. 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 Almost towards end sermon, looked pale and completed sermon and then had to be assisted to sitting the chair. episodic LOC ~ 1-2 min, was a little confused  Poor diet am of admit took DM meds [unknow what it was] CBG 79 when found down  Was wearing slightly tight collar and tie  Carotid sinus massage at bedside confirmed a 35 point drop in pulse and drop in BP as well likely pathognomic for carotid sinus insufficiency but it was unclear if hypoglycemia also played a role in symptoms  Patient stable on tele, evaluated by PT and was d/c home     Discharge Exam: Filed Vitals:   11/07/15 1327  BP: 182/96  Pulse: 72  Temp: 97.7 F (36.5 C)  Resp: 18    General: eomi ncat Cardiovascular:  s1 s 2no m/r/g no le edame Tele pvc No bruits Respiratory: chest clear  Discharge  Instructions   Discharge Instructions    Diet - low sodium heart healthy    Complete by:  As directed      Discharge instructions    Complete by:  As directed   DO not take any diabetes medications until you confim them with the [facility] Continue the rest of your medications without change     Increase activity slowly    Complete by:  As directed           Current Discharge Medication List    CONTINUE these medications which have NOT CHANGED   Details  acetaminophen (TYLENOL) 500 MG tablet Take 500 mg by mouth every 6 (six) hours as needed for mild pain.    amLODipine (NORVASC) 10 MG tablet Take 10 mg by mouth daily.      busPIRone (BUSPAR) 15 MG tablet Take 7.5 mg by mouth 2 (two) times daily.    Cholecalciferol (VITAMIN D3) 2000 UNITS TABS Take 2,000 Units by mouth daily.    Cyanocobalamin (VITAMIN B 12 PO) Take 1 tablet by mouth daily. 592mcg    donepezil (ARICEPT) 10 MG tablet Take 10 mg by mouth daily.    Multiple Vitamins-Minerals (MULTIVITAMIN WITH MINERALS) tablet Take 1 tablet by mouth daily.    salsalate (DISALCID) 750 MG tablet Take 750 mg by mouth 2 (two) times daily as needed (arthritis pain).      STOP taking these medications  aspirin 81 MG tablet        No Known Allergies    The results of significant diagnostics from this hospitalization (including imaging, microbiology, ancillary and laboratory) are listed below for reference.    Significant Diagnostic Studies: Dg Chest 2 View  11/06/2015  CLINICAL DATA:  Lightheadedness today, hypertension, ex-smoker. EXAM: CHEST  2 VIEW COMPARISON:  Chest x-ray dated 03/27/2015. FINDINGS: Study is hypoinspiratory with crowding of the perihilar bronchovascular markings. Given the low lung volumes, lungs appear clear. No evidence of pneumonia. No pleural effusion. No pneumothorax. Cardiomediastinal silhouette appears grossly stable in size and configuration. No acute osseous abnormality. Mild degenerative change again  noted throughout the kyphotic thoracic spine. IMPRESSION: Hypoinspiratory exam. No evidence of acute cardiopulmonary abnormality Electronically Signed   By: Franki Cabot M.D.   On: 11/06/2015 14:43    Microbiology: No results found for this or any previous visit (from the past 240 hour(s)).   Labs: Basic Metabolic Panel:  Recent Labs Lab 11/06/15 1420  NA 139  K 4.3  CL 105  CO2 24  GLUCOSE 261*  BUN 28*  CREATININE 2.04*  CALCIUM 9.1   Liver Function Tests: No results for input(s): AST, ALT, ALKPHOS, BILITOT, PROT, ALBUMIN in the last 168 hours. No results for input(s): LIPASE, AMYLASE in the last 168 hours. No results for input(s): AMMONIA in the last 168 hours. CBC:  Recent Labs Lab 11/06/15 1420  WBC 9.1  NEUTROABS 8.0*  HGB 12.0*  HCT 38.0*  MCV 71.7*  PLT 170   Cardiac Enzymes: No results for input(s): CKTOTAL, CKMB, CKMBINDEX, TROPONINI in the last 168 hours. BNP: BNP (last 3 results)  Recent Labs  03/27/15 1424  BNP 35.6    ProBNP (last 3 results) No results for input(s): PROBNP in the last 8760 hours.  CBG:  Recent Labs Lab 11/06/15 1357 11/07/15 0629 11/07/15 1225  GLUCAP 236* 187* 172*       Signed:  Nita Sells  [facility] 11/07/2015, 2:41 PM

## 2016-03-25 ENCOUNTER — Encounter (HOSPITAL_COMMUNITY): Payer: Self-pay | Admitting: Emergency Medicine

## 2016-03-25 ENCOUNTER — Emergency Department (HOSPITAL_COMMUNITY): Payer: PPO

## 2016-03-25 ENCOUNTER — Observation Stay (HOSPITAL_COMMUNITY)
Admission: EM | Admit: 2016-03-25 | Discharge: 2016-03-26 | Disposition: A | Discharge status: Home / Self Care | Payer: PPO | Attending: Internal Medicine | Admitting: Internal Medicine

## 2016-03-25 DIAGNOSIS — Z79899 Other long term (current) drug therapy: Secondary | ICD-10-CM | POA: Diagnosis not present

## 2016-03-25 DIAGNOSIS — R531 Weakness: Secondary | ICD-10-CM | POA: Diagnosis not present

## 2016-03-25 DIAGNOSIS — N179 Acute kidney failure, unspecified: Secondary | ICD-10-CM | POA: Diagnosis not present

## 2016-03-25 DIAGNOSIS — I129 Hypertensive chronic kidney disease with stage 1 through stage 4 chronic kidney disease, or unspecified chronic kidney disease: Secondary | ICD-10-CM | POA: Diagnosis not present

## 2016-03-25 DIAGNOSIS — Z87891 Personal history of nicotine dependence: Secondary | ICD-10-CM | POA: Insufficient documentation

## 2016-03-25 DIAGNOSIS — Z7984 Long term (current) use of oral hypoglycemic drugs: Secondary | ICD-10-CM | POA: Insufficient documentation

## 2016-03-25 DIAGNOSIS — N189 Chronic kidney disease, unspecified: Secondary | ICD-10-CM

## 2016-03-25 DIAGNOSIS — N183 Chronic kidney disease, stage 3 (moderate): Secondary | ICD-10-CM | POA: Diagnosis not present

## 2016-03-25 DIAGNOSIS — D72829 Elevated white blood cell count, unspecified: Secondary | ICD-10-CM | POA: Diagnosis not present

## 2016-03-25 DIAGNOSIS — D638 Anemia in other chronic diseases classified elsewhere: Secondary | ICD-10-CM | POA: Diagnosis not present

## 2016-03-25 DIAGNOSIS — F0390 Unspecified dementia without behavioral disturbance: Secondary | ICD-10-CM | POA: Insufficient documentation

## 2016-03-25 DIAGNOSIS — Z8546 Personal history of malignant neoplasm of prostate: Secondary | ICD-10-CM | POA: Insufficient documentation

## 2016-03-25 DIAGNOSIS — E119 Type 2 diabetes mellitus without complications: Secondary | ICD-10-CM | POA: Insufficient documentation

## 2016-03-25 DIAGNOSIS — D631 Anemia in chronic kidney disease: Secondary | ICD-10-CM | POA: Insufficient documentation

## 2016-03-25 DIAGNOSIS — R404 Transient alteration of awareness: Secondary | ICD-10-CM | POA: Diagnosis not present

## 2016-03-25 DIAGNOSIS — I10 Essential (primary) hypertension: Secondary | ICD-10-CM | POA: Diagnosis not present

## 2016-03-25 DIAGNOSIS — Z87442 Personal history of urinary calculi: Secondary | ICD-10-CM | POA: Insufficient documentation

## 2016-03-25 DIAGNOSIS — R55 Syncope and collapse: Principal | ICD-10-CM

## 2016-03-25 LAB — CBC WITH DIFFERENTIAL/PLATELET
BASOS PCT: 0 %
Basophils Absolute: 0 10*3/uL (ref 0.0–0.1)
EOS ABS: 0.1 10*3/uL (ref 0.0–0.7)
EOS PCT: 1 %
HCT: 36 % — ABNORMAL LOW (ref 39.0–52.0)
Hemoglobin: 11.9 g/dL — ABNORMAL LOW (ref 13.0–17.0)
LYMPHS ABS: 1.1 10*3/uL (ref 0.7–4.0)
Lymphocytes Relative: 10 %
MCH: 23.2 pg — AB (ref 26.0–34.0)
MCHC: 33.1 g/dL (ref 30.0–36.0)
MCV: 70 fL — AB (ref 78.0–100.0)
MONO ABS: 0.7 10*3/uL (ref 0.1–1.0)
Monocytes Relative: 6 %
Neutro Abs: 9.4 10*3/uL — ABNORMAL HIGH (ref 1.7–7.7)
Neutrophils Relative %: 83 %
PLATELETS: 193 10*3/uL (ref 150–400)
RBC: 5.14 MIL/uL (ref 4.22–5.81)
RDW: 14.5 % (ref 11.5–15.5)
WBC: 11.3 10*3/uL — AB (ref 4.0–10.5)

## 2016-03-25 LAB — URINALYSIS, ROUTINE W REFLEX MICROSCOPIC
Bilirubin Urine: NEGATIVE
Glucose, UA: NEGATIVE mg/dL
Hgb urine dipstick: NEGATIVE
KETONES UR: NEGATIVE mg/dL
LEUKOCYTES UA: NEGATIVE
NITRITE: NEGATIVE
PROTEIN: NEGATIVE mg/dL
Specific Gravity, Urine: 1.009 (ref 1.005–1.030)
pH: 6 (ref 5.0–8.0)

## 2016-03-25 LAB — COMPREHENSIVE METABOLIC PANEL
ALK PHOS: 74 U/L (ref 38–126)
ALT: 22 U/L (ref 17–63)
ANION GAP: 8 (ref 5–15)
AST: 22 U/L (ref 15–41)
Albumin: 3.8 g/dL (ref 3.5–5.0)
BILIRUBIN TOTAL: 0.7 mg/dL (ref 0.3–1.2)
BUN: 25 mg/dL — ABNORMAL HIGH (ref 6–20)
CALCIUM: 8.9 mg/dL (ref 8.9–10.3)
CO2: 25 mmol/L (ref 22–32)
Chloride: 108 mmol/L (ref 101–111)
Creatinine, Ser: 2.07 mg/dL — ABNORMAL HIGH (ref 0.61–1.24)
GFR calc non Af Amer: 28 mL/min — ABNORMAL LOW (ref >60)
GFR, EST AFRICAN AMERICAN: 33 mL/min — AB (ref >60)
Glucose, Bld: 145 mg/dL — ABNORMAL HIGH (ref 65–99)
Potassium: 4.3 mmol/L (ref 3.5–5.1)
Sodium: 141 mmol/L (ref 135–145)
TOTAL PROTEIN: 6.8 g/dL (ref 6.5–8.1)

## 2016-03-25 LAB — I-STAT TROPONIN, ED: Troponin i, poc: 0.01 ng/mL (ref 0.00–0.08)

## 2016-03-25 MED ORDER — DONEPEZIL HCL 10 MG PO TABS
10.0000 mg | ORAL_TABLET | Freq: Every day | ORAL | Status: DC
  Administered 2016-03-26: 10 mg via ORAL
  Filled 2016-03-25: qty 1

## 2016-03-25 MED ORDER — ONDANSETRON HCL 4 MG PO TABS: 4.0000 mg | ORAL_TABLET | Freq: Four times a day (QID) | ORAL | Status: DC | PRN

## 2016-03-25 MED ORDER — SODIUM CHLORIDE 0.9 % IV BOLUS (SEPSIS)
1000.0000 mL | Freq: Once | INTRAVENOUS | Status: AC
  Administered 2016-03-25: 1000 mL via INTRAVENOUS

## 2016-03-25 MED ORDER — AMLODIPINE BESYLATE 10 MG PO TABS
10.0000 mg | ORAL_TABLET | Freq: Every day | ORAL | Status: DC
  Administered 2016-03-26: 10 mg via ORAL
  Filled 2016-03-25: qty 1

## 2016-03-25 MED ORDER — SODIUM CHLORIDE 0.9 % IV SOLN
INTRAVENOUS | Status: DC
  Administered 2016-03-25 – 2016-03-26 (×2): via INTRAVENOUS

## 2016-03-25 MED ORDER — VITAMIN D 1000 UNITS PO TABS
2000.0000 [IU] | ORAL_TABLET | Freq: Every day | ORAL | Status: DC
  Administered 2016-03-26: 2000 [IU] via ORAL
  Filled 2016-03-25: qty 2

## 2016-03-25 MED ORDER — INSULIN ASPART 100 UNIT/ML ~~LOC~~ SOLN
0.0000 [IU] | Freq: Three times a day (TID) | SUBCUTANEOUS | Status: DC
Start: 2016-03-26 — End: 2016-03-26
  Administered 2016-03-26: 1 [IU] via SUBCUTANEOUS
  Administered 2016-03-26: 2 [IU] via SUBCUTANEOUS
  Administered 2016-03-26: 1 [IU] via SUBCUTANEOUS

## 2016-03-25 MED ORDER — ALBUTEROL SULFATE (2.5 MG/3ML) 0.083% IN NEBU: 2.5000 mg | INHALATION_SOLUTION | RESPIRATORY_TRACT | Status: DC | PRN

## 2016-03-25 MED ORDER — ONDANSETRON HCL 4 MG/2ML IJ SOLN: 4.0000 mg | Freq: Four times a day (QID) | INTRAMUSCULAR | Status: DC | PRN

## 2016-03-25 MED ORDER — ACETAMINOPHEN 500 MG PO TABS: 500.0000 mg | ORAL_TABLET | Freq: Four times a day (QID) | ORAL | Status: DC | PRN

## 2016-03-25 MED ORDER — ENOXAPARIN SODIUM 40 MG/0.4ML ~~LOC~~ SOLN
40.0000 mg | SUBCUTANEOUS | Status: DC
  Administered 2016-03-25: 40 mg via SUBCUTANEOUS
  Filled 2016-03-25 (×2): qty 0.4

## 2016-03-25 NOTE — ED Notes (Signed)
Bed: [id number] Expected date:  Expected time:  Means of arrival:  Comments: syncope

## 2016-03-25 NOTE — ED Notes (Signed)
Pt from church via EMS- Per EMS, pt was lying on the floor at church upon arrival. 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 Bystanders assisted pt to floor so pt did not hit head or have any injuries. Pt denied dizziness, SOB, CP on scene and received 400 ml NS en route. Pt is A&O and in NAD.

## 2016-03-25 NOTE — ED Provider Notes (Addendum)
CSN: [id number]     Arrival date & time 03/25/16  1405 History   First MD Initiated Contact with Patient 03/25/16 1504     Chief Complaint  Patient presents with  . 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 After performing communion he walked to his office. Parishioners saw a shunt leaning to the right and lowered him to the floor. Patient had complete loss of consciousness. Patient lost bowel contents. Patient does not remember feeling he hot nauseated, presyncopal, or any prodrome.  Patient's wife provides most of the story. She reports that he's been having trouble with his memory in the past bit.   Past Medical History  Diagnosis Date  . Hypertension   . Stone, kidney   . Stone, kidney   . Cancer [facility])     prostate  . Gallstones    Past Surgical History  Procedure Laterality Date  . Prostate surgery     Family History  Problem Relation Age of Onset  . Lung cancer Sister    Social History  Substance Use Topics  . Smoking status: Former Smoker -- 0.50 packs/day for 20 years    Quit date: 12/02/1972  . Smokeless tobacco: Never Used  . Alcohol Use: No    Review of Systems  Constitutional: Negative for activity change.  HENT: Negative for congestion.   Respiratory: Negative for shortness of breath.   Cardiovascular: Negative for chest pain.  Gastrointestinal: Negative for abdominal pain.  Genitourinary: Negative for dysuria.  Neurological: Positive for syncope.  Psychiatric/Behavioral: Negative for behavioral problems and agitation.      Allergies  Review of patient's allergies indicates no known allergies.  Home Medications   Prior to Admission medications   Medication Sig Start Date End Date Taking? Authorizing Provider  acetaminophen (TYLENOL) 500 MG tablet Take 500 mg by  mouth every 6 (six) hours as needed for mild pain.   Yes Historical Provider, MD  amLODipine (NORVASC) 10 MG tablet Take 10 mg by mouth daily.     Yes Historical Provider, MD  Cholecalciferol (VITAMIN D3) 2000 UNITS TABS Take 2,000 Units by mouth daily.   Yes Historical Provider, MD  Cyanocobalamin (VITAMIN B 12 PO) Take 1 tablet by mouth daily. 554mcg   Yes Historical Provider, MD  donepezil (ARICEPT) 10 MG tablet Take 10 mg by mouth daily.   Yes Historical Provider, MD  glipiZIDE (GLUCOTROL) 5 MG tablet Take 5 mg by mouth 2 (two) times daily before a meal.   Yes Historical Provider, MD   BP 152/78 mmHg  Pulse 61  Temp(Src) 97.9 F (36.6 C) (Oral)  Resp 18  SpO2 96% Physical Exam  Constitutional: He is oriented to person, place, and time. He appears well-nourished.  Mildly clammy.  HENT:  Head: Normocephalic.  Mouth/Throat: Oropharynx is clear and moist.  Eyes: Conjunctivae are normal.  Neck: No tracheal deviation present.  Cardiovascular: Normal rate.   Pulmonary/Chest: Effort normal. No stridor. No respiratory distress.  Abdominal: Soft. There is no tenderness. There is no guarding.  Musculoskeletal: Normal range of motion. He exhibits no edema.  Neurological: He is oriented to person, place, and time. No cranial nerve deficit.  Patient having trouble with recent memories.  Skin: Skin is warm and dry. No rash noted. He is not diaphoretic.  Psychiatric: He has a normal mood and affect. His behavior is normal.  Nursing note and  vitals reviewed.   ED Course  Procedures (including critical care time) Labs Review Labs Reviewed  CBC WITH DIFFERENTIAL/PLATELET - Abnormal; Notable for the following:    WBC 11.3 (*)    Hemoglobin 11.9 (*)    HCT 36.0 (*)    MCV 70.0 (*)    MCH 23.2 (*)    Neutro Abs 9.4 (*)    All other components within normal limits  COMPREHENSIVE METABOLIC PANEL - Abnormal; Notable for the following:    Glucose, Bld 145 (*)    BUN 25 (*)    Creatinine, Ser  2.07 (*)    GFR calc non Af Amer 28 (*)    GFR calc Af Amer 33 (*)    All other components within normal limits  I-STAT TROPOININ, ED    Imaging Review Dg Chest 2 View  03/25/2016  CLINICAL DATA:  Passed out in church today, history hypertension, prostate cancer, kidney stones EXAM: CHEST  2 VIEW COMPARISON:  11/06/2015 FINDINGS: Normal heart size, mediastinal contours, and pulmonary vascularity. Atherosclerotic calcification aorta. Lungs clear. Chronic elevation of LEFT diaphragm again noted. No pleural effusion or pneumothorax. Bones unremarkable. IMPRESSION: No acute abnormalities. Electronically Signed   By: Lavonia Dana M.D.   On: 03/25/2016 15:48   Ct Head Wo Contrast  03/25/2016  CLINICAL DATA:  Syncope. EXAM: CT HEAD WITHOUT CONTRAST TECHNIQUE: Contiguous axial images were obtained from the base of the skull through the vertex without intravenous contrast. COMPARISON:  December 03, 2011. FINDINGS: Bony calvarium appears intact. Moderate diffuse cortical atrophy is noted. Mild chronic ischemic white matter disease is noted. No mass effect or midline shift is noted. Ventricular size is within normal limits. There is no evidence of mass lesion, hemorrhage or acute infarction. IMPRESSION: Mild chronic ischemic white matter disease. Moderate diffuse cortical atrophy. No acute intracranial abnormality seen. Electronically Signed   By: Marijo Conception, M.D.   On: 03/25/2016 15:45   I have personally reviewed and evaluated these images and lab results as part of my medical decision-making.   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 Patient had no prodromal symptoms although it appears the patient is undergoing increasing issues with dementia so it is possible he does not remember. Wife reports that he is not remembering many things and she is helping him with his daily activities..  I'm concerned for a number of reasons about the syncope given patient's age, the fact that he had no prodromal symptoms, and the fact that he lost his bowel continence during the event.  We'll get baseline labs, troponin, EKG, use telemetry, CT.  Discussed with wife and patient that the circumstances I would recommend observation admission to make sure the patient does not have a dysrhythmia.  6:35 PM Patient's wife does not feel like she wants to stay here in the in hospital. She said that he's been admitted twice for this previously and nothing came of admission. I do not do comfortable discharging them home given aspects of syncope listed earlier. so we'll have him leave AMA. I do however feel that  this is a reasonable reason and risk. I expressed my concerns about disability or death and they expressed understanding   8:04 PM Patient disagrees with wife and would like to stay.  I would like to admit to observation for tele for syncope./dysrythmia.    Savio Albrecht Julio Alm, MD 03/25/16 [place], MD 03/25/16 2005

## 2016-03-25 NOTE — ED Notes (Signed)
Patient was advised of AMA.  Patient has decided to stay.  Informed MD.

## 2016-03-25 NOTE — H&P (Addendum)
[facility] History and Physical  [name] [name] [id number] DOB: [birth year] DOA: 03/25/2016  Referring physician: Dr.  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 She states that he looked shaky before going down to his knees and then to the floor. There was reported that someone lowered him to the floor, but wife does not collaborate this. She denies that he hit his head and was only out for a for less than a minute. Patient only recalls going to the bathroom and feeling somewhat lightheaded. Denies any chest pain, palpitations, or shortness of breath. There was no witnessed seizure activity and he was reported to have lost his bowels. His wife states that this is not the first time this has occurred. Upon review of records it he had a syncopal event in 10/2015 and was also noted to have a another in 11/2011. She relates both previous episodes to dehydration as workup were reported to being negative.   Upon admission patient is evaluated and seen to have EKG within normal limits, initial troponin negative, and no obvious signs of orthostatic hypotension after initial fluid boluses. Lab work revealed WBC of 11.3, hemoglobin 11.9, BUN 25, creatinine 2.07, and glucose 145.  Review of Systems  Constitutional: Negative for fever and chills.  HENT: Negative for ear pain.   Eyes: Negative for photophobia and pain.  Respiratory: Negative for cough and sputum production.   Cardiovascular: Negative for palpitations, orthopnea and leg swelling.  Gastrointestinal: Negative for nausea, vomiting, abdominal pain,  constipation, blood in stool and melena.  Genitourinary: Negative for urgency and frequency.  Musculoskeletal: Negative for back pain and neck pain.  Skin: Negative for itching and rash.  Neurological: Positive for loss of consciousness. Negative for tremors and headaches.       Positive for lightheadedness  Endo/Heme/Allergies: Negative for environmental allergies and polydipsia.  Psychiatric/Behavioral: Positive for memory loss. Negative for substance abuse. The patient is not nervous/anxious.         Past Medical History  Diagnosis Date  . Hypertension   . Stone, kidney   . Stone, kidney   . Cancer [facility])     prostate  . Gallstones      Past Surgical History  Procedure Laterality Date  . Prostate surgery        Social History:  reports that he quit smoking about 43 years ago. He has never used smokeless tobacco. He reports that he does not drink alcohol or use illicit drugs. Where does patient live--home  and with whom if at home?Wife Can patient participate in ADLs? Yes  No Known Allergies  Family History  Problem Relation Age of Onset  . Lung cancer Sister        Prior to Admission medications   Medication Sig Start Date End Date Taking? Authorizing Provider  acetaminophen (TYLENOL) 500 MG tablet Take 500 mg by mouth every 6 (six) hours as needed for mild pain.   Yes Historical Provider, MD  amLODipine (NORVASC) 10 MG tablet Take 10 mg by mouth daily.     Yes Historical Provider, MD  Cholecalciferol (VITAMIN D3) 2000 UNITS TABS Take  2,000 Units by mouth daily.   Yes Historical Provider, MD  Cyanocobalamin (VITAMIN B 12 PO) Take 1 tablet by mouth daily. 569mg   Yes Historical Provider, MD  donepezil (ARICEPT) 10 MG tablet Take 10 mg by mouth daily.   Yes Historical Provider, MD  glipiZIDE (GLUCOTROL) 5 MG tablet Take 5 mg by mouth 2 (two) times daily before a meal.   Yes Historical Provider, MD     Physical Exam: Filed Vitals:   03/25/16 1412 03/25/16  1807 03/25/16 1930 03/25/16 2000  BP: 131/80 152/78 152/82 158/93  Pulse: 62 61 66 69  Temp: 97.9 F (36.6 C)     TempSrc: Oral     Resp: '18 18 20 19  ' SpO2: 95% 96% 97% 97%     Constitutional: Vital signs reviewed. Patient is a well-developed and well-nourished in no acute distress and cooperative with exam. Alert and oriented x3.  Head: Normocephalic and atraumatic  Ear: TM normal bilaterally  Mouth: no erythema or exudates, MMM  Eyes: PERRL, EOMI, conjunctivae normal, No scleral icterus.  Neck: Supple, Trachea midline normal ROM, No JVD, mass, thyromegaly, or carotid bruit present.  Cardiovascular: RRR, S1 normal, S2 normal, no MRG, pulses symmetric and intact bilaterally  Pulmonary/Chest: CTAB, no wheezes, rales, or rhonchi  Abdominal: Soft. Non-tender, non-distended, bowel sounds are normal, no masses, organomegaly, or guarding present.  GU: no CVA tenderness Musculoskeletal: No joint deformities, erythema, or stiffness, ROM full and no nontender Ext: no edema and no cyanosis, pulses palpable bilaterally (DP and PT)  Hematology: no cervical, inginal, or axillary adenopathy.  Neurological: A&O x3, Strenght is normal and symmetric bilaterally, cranial nerve II-XII are grossly intact, no focal motor deficit, sensory intact to light touch bilaterally.  Skin: Warm, dry and intact. No rash, cyanosis, or clubbing.  Psychiatric: Normal mood and affect. speech and behavior is normal. Judgment and thought content normal. Cognition and memory are normal.      Data Review   Micro Results No results found for this or any previous visit (from the past 240 hour(s)).  Radiology Reports Dg Chest 2 View  03/25/2016  CLINICAL DATA:  Passed out in church today, history hypertension, prostate cancer, kidney stones EXAM: CHEST  2 VIEW COMPARISON:  11/06/2015 FINDINGS: Normal heart size, mediastinal contours, and pulmonary vascularity. Atherosclerotic calcification aorta. Lungs clear. Chronic  elevation of LEFT diaphragm again noted. No pleural effusion or pneumothorax. Bones unremarkable. IMPRESSION: No acute abnormalities. Electronically Signed   By: MLavonia DanaM.D.   On: 03/25/2016 15:48   Ct Head Wo Contrast  03/25/2016  CLINICAL DATA:  Syncope. EXAM: CT HEAD WITHOUT CONTRAST TECHNIQUE: Contiguous axial images were obtained from the base of the skull through the vertex without intravenous contrast. COMPARISON:  December 03, 2011. FINDINGS: Bony calvarium appears intact. Moderate diffuse cortical atrophy is noted. Mild chronic ischemic white matter disease is noted. No mass effect or midline shift is noted. Ventricular size is within normal limits. There is no evidence of mass lesion, hemorrhage or acute infarction. IMPRESSION: Mild chronic ischemic white matter disease. Moderate diffuse cortical atrophy. No acute intracranial abnormality seen. Electronically Signed   By: JMarijo Conception M.D.   On: 03/25/2016 15:45     CBC  Recent Labs Lab 03/25/16 1550  WBC 11.3*  HGB 11.9*  HCT 36.0*  PLT 193  MCV 70.0*  MCH 23.2*  MCHC 33.1  RDW 14.5  LYMPHSABS 1.1  MONOABS 0.7  EOSABS 0.1  BASOSABS 0.0    Chemistries  Recent Labs Lab 03/25/16 1709  NA 141  K 4.3  CL 108  CO2 25  GLUCOSE 145*  BUN 25*  CREATININE 2.07*  CALCIUM 8.9  AST 22  ALT 22  ALKPHOS 74  BILITOT 0.7   ------------------------------------------------------------------------------------------------------------------ CrCl cannot be calculated (Unknown ideal weight.). ------------------------------------------------------------------------------------------------------------------ No results for input(s): HGBA1C in the last 72 hours. ------------------------------------------------------------------------------------------------------------------ No results for input(s): CHOL, HDL, LDLCALC, TRIG, CHOLHDL, LDLDIRECT in the last 72  hours. ------------------------------------------------------------------------------------------------------------------ No results for input(s): TSH, T4TOTAL, T3FREE, THYROIDAB in the last 72 hours.  Invalid input(s): FREET3 ------------------------------------------------------------------------------------------------------------------ No results for input(s): VITAMINB12, FOLATE, FERRITIN, TIBC, IRON, RETICCTPCT in the last 72 hours.  Coagulation profile No results for input(s): INR, PROTIME in the last 168 hours.  No results for input(s): DDIMER in the last 72 hours.  Cardiac Enzymes No results for input(s): CKMB, TROPONINI, MYOGLOBIN in the last 168 hours.  Invalid input(s): CK ------------------------------------------------------------------------------------------------------------------ Invalid input(s): POCBNP   CBG: No results for input(s): GLUCAP in the last 168 hours.     EKG: Independently reviewed. EKG showing normal sinus rhythm  Assessment/Plan  Syncope: Acute. Patient has had recurrent episodes in the past. Initial troponins negative and EKG within normal limits. Given history suspect that this could be vasovagal with possible aspect of dehydration versus cardiac arrhythmia.Patient was given 400 mL in route with EMS and and 1 L while in the emergency department of normal saline fluids. - Admit to a telemetry bed  - Check orthostatic vital signs   - Trend troponins - IV fluids of normal saline at 75 mL per hour overnight - Patient may benefit from being set up to have a Holter monitor - Follow-up telemetry overnight  Leukocytosis: Acute. WBC 11.3 on admission. Questioned of possibility of underlying infection, but urinalysis and chest x-ray appear within normal limits. - Check CRP and ESR - Recheck CBC in a.m.  Anemia: Chronic. Hemoglobin 11.9 on admission - Recheck CBC in a.m.; if found to be a substantial drop in hemoglobin will need to check for  possible source of bleeding patient denied any dark or black stools - Check TIBC and iron   Possible acute kidney injury on Chronic kidney disease stage III: Stable. There is mild elevation patient's BUN that may suggest some dehydration, but creatinine appears close to the patient's creatinine on previous admissions. - Repeat BMP in a.m.   Essential hypertension  -Continue amlodipine   Diabetes mellitus type 2 - Held glyburide - Hypoglycemic protocols - CBGs every before meals with a sensitive sliding-scale insulin  Mild cognitive impairment - Continue donepezil   Code Status:   full Family Communication: bedside Disposition Plan: admit   Total time spent 55 minutes.Greater than 50% of this time was spent in counseling, explanation of diagnosis, planning of further management, and coordination of care  [facility] Pager [phone]  If 7PM-7AM, please contact night-coverage [email or web address] Password [facility] 03/25/2016, 9:38 PM

## 2016-03-26 ENCOUNTER — Observation Stay (HOSPITAL_BASED_OUTPATIENT_CLINIC_OR_DEPARTMENT_OTHER): Payer: PPO

## 2016-03-26 DIAGNOSIS — R55 Syncope and collapse: Secondary | ICD-10-CM | POA: Diagnosis not present

## 2016-03-26 DIAGNOSIS — D638 Anemia in other chronic diseases classified elsewhere: Secondary | ICD-10-CM

## 2016-03-26 DIAGNOSIS — N189 Chronic kidney disease, unspecified: Secondary | ICD-10-CM

## 2016-03-26 DIAGNOSIS — N179 Acute kidney failure, unspecified: Secondary | ICD-10-CM | POA: Diagnosis not present

## 2016-03-26 DIAGNOSIS — I10 Essential (primary) hypertension: Secondary | ICD-10-CM | POA: Diagnosis not present

## 2016-03-26 LAB — GLUCOSE, CAPILLARY
Glucose-Capillary: 129 mg/dL — ABNORMAL HIGH (ref 65–99)
Glucose-Capillary: 136 mg/dL — ABNORMAL HIGH (ref 65–99)
Glucose-Capillary: 187 mg/dL — ABNORMAL HIGH (ref 65–99)

## 2016-03-26 LAB — TROPONIN I: Troponin I: <0.03 ng/mL (ref <0.031)

## 2016-03-26 LAB — BASIC METABOLIC PANEL
Anion gap: 6 (ref 5–15)
BUN: 21 mg/dL — AB (ref 6–20)
CALCIUM: 8.9 mg/dL (ref 8.9–10.3)
CO2: 25 mmol/L (ref 22–32)
CREATININE: 1.73 mg/dL — AB (ref 0.61–1.24)
Chloride: 111 mmol/L (ref 101–111)
GFR calc Af Amer: 41 mL/min — ABNORMAL LOW (ref >60)
GFR, EST NON AFRICAN AMERICAN: 35 mL/min — AB (ref >60)
GLUCOSE: 131 mg/dL — AB (ref 65–99)
POTASSIUM: 4 mmol/L (ref 3.5–5.1)
SODIUM: 142 mmol/L (ref 135–145)

## 2016-03-26 LAB — C-REACTIVE PROTEIN: CRP: 0.6 mg/dL (ref <1.0)

## 2016-03-26 LAB — CBC
HCT: 33 % — ABNORMAL LOW (ref 39.0–52.0)
Hemoglobin: 10.3 g/dL — ABNORMAL LOW (ref 13.0–17.0)
MCH: 21.8 pg — AB (ref 26.0–34.0)
MCHC: 31.2 g/dL (ref 30.0–36.0)
MCV: 69.8 fL — AB (ref 78.0–100.0)
PLATELETS: 169 10*3/uL (ref 150–400)
RBC: 4.73 MIL/uL (ref 4.22–5.81)
RDW: 14.3 % (ref 11.5–15.5)
WBC: 8.9 10*3/uL (ref 4.0–10.5)

## 2016-03-26 LAB — IRON AND TIBC
Iron: 43 ug/dL — ABNORMAL LOW (ref 45–182)
SATURATION RATIOS: 16 % — AB (ref 17.9–39.5)
TIBC: 276 ug/dL (ref 250–450)
UIBC: 233 ug/dL

## 2016-03-26 LAB — TSH: TSH: 2.669 u[IU]/mL (ref 0.350–4.500)

## 2016-03-26 LAB — SEDIMENTATION RATE: Sed Rate: 12 mm/hr (ref 0–16)

## 2016-03-26 LAB — ECHOCARDIOGRAM COMPLETE
Height: 73 in
WEIGHTICAEL: 3873.04 [oz_av]

## 2016-03-26 MED ORDER — HYDROMORPHONE HCL 1 MG/ML IJ SOLN
INTRAMUSCULAR | Status: AC
  Filled 2016-03-26: qty 1

## 2016-03-26 NOTE — Consult Note (Signed)
Cardiologist:  New Reason for Consult: Syncope Referring Physician:   Chistian Kasler is an 82 y.o. male.  HPI:   The patient is an 82 yo male with a history of syncope 2012/2016, HTN, prostate cancer,  Chronic kidney disease stage III, mild dementia and anemia.  Echocardiogram April 2016 shows his ejection fraction was 55-60% with normal wall motion. Grade 1 diastolic dysfunction. Left atrium was mildly dilated. The patient is a Theme park manager at church and yesterday after service he was heading back to his office and suddenly felt "funny" and warm. He sat down in the nearest chair and passed out. He is unsure of the time frame.   Denies hitting his head.  EMS was already there.  He hhinks it was about 20-30 minutes.  He denies being incontinent of urine or stool.   Patient does not recall ever having previous syncopal episode however, there is a discharge summary from November 2016 says he was here for syncope and collapse and also April 2016.  The patient currently denies nausea, vomiting, fever, chest pain, shortness of breath, orthopnea, dizziness, PND, cough, congestion, abdominal pain, hematochezia, melena, lower extremity edema, claudication.  CT of the head without contrast shows no acute intracranial abnormality.  Past Medical History  Diagnosis Date  . Hypertension   . Stone, kidney   . Stone, kidney   . Cancer [facility])     prostate  . Gallstones     Past Surgical History  Procedure Laterality Date  . Prostate surgery      Family History  Problem Relation Age of Onset  . Lung cancer Sister     Social History:  reports that he quit smoking about 43 years ago. He has never used smokeless tobacco. He reports that he does not drink alcohol or use illicit drugs.  Allergies: No Known Allergies  Medications:  Scheduled Meds: . amLODipine  10 mg Oral Daily  . cholecalciferol  2,000 Units Oral Daily  . donepezil  10 mg Oral Daily  . enoxaparin (LOVENOX) injection  40 mg Subcutaneous  Q24H  . insulin aspart  0-9 Units Subcutaneous TID WC   Continuous Infusions: . sodium chloride 75 mL/hr at 03/25/16 2203   PRN Meds:.acetaminophen, albuterol, ondansetron **OR** ondansetron (ZOFRAN) IV   Results for orders placed or performed during the hospital encounter of 03/25/16 (from the past 48 hour(s))  CBC with Differential/Platelet     Status: Abnormal   Collection Time: 03/25/16  3:50 PM  Result Value Ref Range   WBC 11.3 (H) 4.0 - 10.5 K/uL   RBC 5.14 4.22 - 5.81 MIL/uL   Hemoglobin 11.9 (L) 13.0 - 17.0 g/dL   HCT 36.0 (L) 39.0 - 52.0 %   MCV 70.0 (L) 78.0 - 100.0 fL   MCH 23.2 (L) 26.0 - 34.0 pg   MCHC 33.1 30.0 - 36.0 g/dL   RDW 14.5 11.5 - 15.5 %   Platelets 193 150 - 400 K/uL   Neutrophils Relative % 83 %   Lymphocytes Relative 10 %   Monocytes Relative 6 %   Eosinophils Relative 1 %   Basophils Relative 0 %   Neutro Abs 9.4 (H) 1.7 - 7.7 K/uL   Lymphs Abs 1.1 0.7 - 4.0 K/uL   Monocytes Absolute 0.7 0.1 - 1.0 K/uL   Eosinophils Absolute 0.1 0.0 - 0.7 K/uL   Basophils Absolute 0.0 0.0 - 0.1 K/uL   Smear Review MORPHOLOGY UNREMARKABLE   I-stat troponin, ED     Status:  None   Collection Time: 03/25/16  4:00 PM  Result Value Ref Range   Troponin i, poc 0.01 0.00 - 0.08 ng/mL   Comment 3            Comment: Due to the release kinetics of cTnI, a negative result within the first hours of the onset of symptoms does not rule out myocardial infarction with certainty. If myocardial infarction is still suspected, repeat the test at appropriate intervals.   Comprehensive metabolic panel     Status: Abnormal   Collection Time: 03/25/16  5:09 PM  Result Value Ref Range   Sodium 141 135 - 145 mmol/L   Potassium 4.3 3.5 - 5.1 mmol/L   Chloride 108 101 - 111 mmol/L   CO2 25 22 - 32 mmol/L   Glucose, Bld 145 (H) 65 - 99 mg/dL   BUN 25 (H) 6 - 20 mg/dL   Creatinine, Ser 2.07 (H) 0.61 - 1.24 mg/dL   Calcium 8.9 8.9 - 10.3 mg/dL   Total Protein 6.8 6.5 - 8.1 g/dL     Albumin 3.8 3.5 - 5.0 g/dL   AST 22 15 - 41 U/L   ALT 22 17 - 63 U/L   Alkaline Phosphatase 74 38 - 126 U/L   Total Bilirubin 0.7 0.3 - 1.2 mg/dL   GFR calc non Af Amer 28 (L) >60 mL/min   GFR calc Af Amer 33 (L) >60 mL/min    Comment: (NOTE) The eGFR has been calculated using the CKD EPI equation. This calculation has not been validated in all clinical situations. eGFR's persistently <60 mL/min signify possible Chronic Kidney Disease.    Anion gap 8 5 - 15  Urinalysis, Routine w reflex microscopic (not at [facility])     Status: None   Collection Time: 03/25/16  8:31 PM  Result Value Ref Range   Color, Urine YELLOW YELLOW   APPearance CLEAR CLEAR   Specific Gravity, Urine 1.009 1.005 - 1.030   pH 6.0 5.0 - 8.0   Glucose, UA NEGATIVE NEGATIVE mg/dL   Hgb urine dipstick NEGATIVE NEGATIVE   Bilirubin Urine NEGATIVE NEGATIVE   Ketones, ur NEGATIVE NEGATIVE mg/dL   Protein, ur NEGATIVE NEGATIVE mg/dL   Nitrite NEGATIVE NEGATIVE   Leukocytes, UA NEGATIVE NEGATIVE    Comment: MICROSCOPIC NOT DONE ON URINES WITH NEGATIVE PROTEIN, BLOOD, LEUKOCYTES, NITRITE, OR GLUCOSE <1000 mg/dL.  TSH     Status: None   Collection Time: 03/26/16  5:03 AM  Result Value Ref Range   TSH 2.669 0.350 - 4.500 uIU/mL  C-reactive protein     Status: None   Collection Time: 03/26/16  5:03 AM  Result Value Ref Range   CRP 0.6 <1.0 mg/dL    Comment: Performed at [facility]  Sedimentation rate     Status: None   Collection Time: 03/26/16  5:03 AM  Result Value Ref Range   Sed Rate 12 0 - 16 mm/hr  Iron and TIBC     Status: Abnormal   Collection Time: 03/26/16  5:03 AM  Result Value Ref Range   Iron 43 (L) 45 - 182 ug/dL   TIBC 276 250 - 450 ug/dL   Saturation Ratios 16 (L) 17.9 - 39.5 %   UIBC 233 ug/dL    Comment: Performed at [facility]  CBC     Status: Abnormal   Collection Time: 03/26/16  5:04 AM  Result Value Ref Range   WBC 8.9 4.0 - 10.5 K/uL  RBC 4.73 4.22 - 5.81 MIL/uL    Hemoglobin 10.3 (L) 13.0 - 17.0 g/dL   HCT 33.0 (L) 39.0 - 52.0 %   MCV 69.8 (L) 78.0 - 100.0 fL   MCH 21.8 (L) 26.0 - 34.0 pg   MCHC 31.2 30.0 - 36.0 g/dL   RDW 14.3 11.5 - 15.5 %   Platelets 169 150 - 400 K/uL  Basic metabolic panel     Status: Abnormal   Collection Time: 03/26/16  5:04 AM  Result Value Ref Range   Sodium 142 135 - 145 mmol/L   Potassium 4.0 3.5 - 5.1 mmol/L   Chloride 111 101 - 111 mmol/L   CO2 25 22 - 32 mmol/L   Glucose, Bld 131 (H) 65 - 99 mg/dL   BUN 21 (H) 6 - 20 mg/dL   Creatinine, Ser 1.73 (H) 0.61 - 1.24 mg/dL   Calcium 8.9 8.9 - 10.3 mg/dL   GFR calc non Af Amer 35 (L) >60 mL/min   GFR calc Af Amer 41 (L) >60 mL/min    Comment: (NOTE) The eGFR has been calculated using the CKD EPI equation. This calculation has not been validated in all clinical situations. eGFR's persistently <60 mL/min signify possible Chronic Kidney Disease.    Anion gap 6 5 - 15  Glucose, capillary     Status: Abnormal   Collection Time: 03/26/16  7:31 AM  Result Value Ref Range   Glucose-Capillary 129 (H) 65 - 99 mg/dL    Dg Chest 2 View  03/25/2016  CLINICAL DATA:  Passed out in church today, history hypertension, prostate cancer, kidney stones EXAM: CHEST  2 VIEW COMPARISON:  11/06/2015 FINDINGS: Normal heart size, mediastinal contours, and pulmonary vascularity. Atherosclerotic calcification aorta. Lungs clear. Chronic elevation of LEFT diaphragm again noted. No pleural effusion or pneumothorax. Bones unremarkable. IMPRESSION: No acute abnormalities. Electronically Signed   By: Lavonia Dana M.D.   On: 03/25/2016 15:48   Ct Head Wo Contrast  03/25/2016  CLINICAL DATA:  Syncope. EXAM: CT HEAD WITHOUT CONTRAST TECHNIQUE: Contiguous axial images were obtained from the base of the skull through the vertex without intravenous contrast. COMPARISON:  December 03, 2011. FINDINGS: Bony calvarium appears intact. Moderate diffuse cortical atrophy is noted. Mild chronic ischemic white matter  disease is noted. No mass effect or midline shift is noted. Ventricular size is within normal limits. There is no evidence of mass lesion, hemorrhage or acute infarction. IMPRESSION: Mild chronic ischemic white matter disease. Moderate diffuse cortical atrophy. No acute intracranial abnormality seen. Electronically Signed   By: Marijo Conception, M.D.   On: 03/25/2016 15:45    Review of Systems  Constitutional: Negative for fever, chills and diaphoresis.  HENT: Negative for congestion and sore throat.   Respiratory: Negative for cough and shortness of breath.   Cardiovascular: Negative for chest pain, palpitations, orthopnea, leg swelling and PND.  Gastrointestinal: Negative for nausea, vomiting, abdominal pain, blood in stool and melena.  Genitourinary: Negative for hematuria.  Musculoskeletal: Negative for myalgias.  Neurological: Positive for loss of consciousness. Negative for dizziness and weakness.  All other systems reviewed and are negative.  Blood pressure 142/74, pulse 61, temperature 98.2 F (36.8 C), temperature source Oral, resp. rate 18, height '6\' 1"'  (1.854 m), weight 242 lb 1 oz (109.8 kg), SpO2 98 %. Physical Exam  Nursing note and vitals reviewed. Constitutional: He is oriented to person, place, and time. He appears well-developed and well-nourished. No distress.  HENT:  Head: Normocephalic and atraumatic.  Eyes: EOM are normal. Pupils are equal, round, and reactive to light.  Neck: Normal range of motion. Neck supple.  Cardiovascular: Normal rate, regular rhythm, S1 normal and S2 normal.   No murmur heard. Pulses:      Radial pulses are 2+ on the right side, and 2+ on the left side.       Dorsalis pedis pulses are 2+ on the right side, and 2+ on the left side.  Respiratory: Effort normal and breath sounds normal. He has no wheezes. He has no rales.  GI: Soft. Bowel sounds are normal. He exhibits no distension. There is no tenderness.  Musculoskeletal: He exhibits no  edema.  Lymphadenopathy:    He has no cervical adenopathy.  Neurological: He is alert and oriented to person, place, and time. He exhibits normal muscle tone.  Skin: Skin is warm and dry.  Psychiatric: He has a normal mood and affect.    Assessment/Plan: Principal Problem:   Syncope Active Problems:   Anemia of chronic disease   Hypertension   Acute kidney injury superimposed on chronic kidney disease ([place])  81 yo male with a history of syncope 2012/2016, HTN, prostate cancer,  Chronic kidney disease stage III, mild dementia and anemia.  Echocardiogram April 2016 shows his ejection fraction was 55-60% with normal wall motion. Grade 1 diastolic dysfunction. Left atrium was mildly dilated.  He was given a 1 L bolus yesterday at 1550hrs.  He was not orthostatic.  Telemetry last 24 hours shows no arrhythmia to suggest cause for syncope.  His EKG yesterday was normal sinus rhythm.  2-D echocardiogram is pending however, suspect this can be normal.  Recommend outpatient monitoring for 30 days. Of course, his syncopal episodes are quite sporadic. Consider [employer] LINQ implant.  Serum creatinine has improved after IV fluids suggesting maybe he was may have been dehydrated.   Tarri Fuller, [facility] 03/26/2016, 10:56 AM    Patient examined chart reviewed. Prodrome sounds vagal. Unlikely to be cardiac in etiology with normal exam, ECG and EF by echo.  Will see what current echo  Looks like but likely will arrange outpatient EP f/u for ILR.  Currently BP fine and not postural .  Telemetry with NSR no arrhythmia  Jenkins Rouge

## 2016-03-26 NOTE — Discharge Summary (Signed)
Physician Discharge Summary  [name] [name] [id number] DOB: 1933/11/25 DOA: 03/25/2016  PCP: PROVIDER NOT IN SYSTEM  Admit date: 03/25/2016 Discharge date: 03/26/2016  Time spent: 35 minutes  Recommendations for Outpatient Follow-up:  1. Follow up with Cards as an outpatient for monitor.   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 She states that he looked shaky before going down to his knees and then to the floor  Hospital Course:  Syncope: She is at no events on telemetry.  Cardiac biomarkers in the ED were negative, his blood pressure has remained stable.  Leukocytosis is improved. 2-D echoShowed no AS. Cardiology recommended to follow-up as an outpatient with a monitor.  Anemia of chronic disease Due to chronic renal disease, at baseline.  Essential Hypertension Continue Norvasc pressure well controlled.  No changes were made.  Acute kidney injury superimposed on chronic kidney disease [facility]): Baseline creatinine ranges from 1.4-1.8. She was on IV fluid hydration and is slowly improving the most likely causes prerenal.    Procedures:  Echo  Consultations:  cardiology  Discharge Exam: Filed Vitals:   03/26/16 0528 03/26/16 1447  BP: 142/74 162/88  Pulse: 61 67  Temp: 98.2 F (36.8 C) 97.9 F (36.6 C)  Resp:  18 20    General: A&O x3 Cardiovascular: RRR Respiratory: good air movement CTA B/L  Discharge Instructions   Discharge Instructions    Diet - low sodium heart healthy    Complete by:  As directed      Increase activity slowly    Complete by:  As directed           Current Discharge Medication List    CONTINUE these medications which have NOT CHANGED   Details  acetaminophen (TYLENOL) 500 MG tablet Take 500 mg by mouth every 6 (six) hours as needed for mild pain.    amLODipine (NORVASC) 10 MG tablet Take 10 mg by mouth daily.      Cholecalciferol (VITAMIN D3) 2000 UNITS TABS Take 2,000 Units by mouth daily.    Cyanocobalamin (VITAMIN B 12 PO) Take 1 tablet by mouth daily. 522mcg    donepezil (ARICEPT) 10 MG tablet Take 10 mg by mouth daily.    glipiZIDE (GLUCOTROL) 5 MG tablet Take 5 mg by mouth 2 (two) times daily before a meal.       No Known Allergies Follow-up Information    Follow up with PROVIDER NOT IN SYSTEM. Schedule an appointment as soon as possible for a visit in 1 day.       The results of significant diagnostics from this hospitalization (including imaging, microbiology, ancillary and laboratory) are listed below for reference.    Significant Diagnostic Studies: Dg Chest 2 View  03/25/2016  CLINICAL DATA:  Passed out in church today, history hypertension, prostate cancer, kidney stones EXAM: CHEST  2 VIEW COMPARISON:  11/06/2015 FINDINGS: Normal heart size, mediastinal contours, and pulmonary vascularity. Atherosclerotic calcification aorta. Lungs clear. Chronic elevation of LEFT diaphragm again noted. No pleural effusion or pneumothorax. Bones unremarkable. IMPRESSION: No acute abnormalities. Electronically Signed   By: Lavonia Dana M.D.   On: 03/25/2016 15:48   Ct Head Wo Contrast  03/25/2016  CLINICAL DATA:  Syncope. EXAM: CT HEAD WITHOUT CONTRAST TECHNIQUE: Contiguous axial images were obtained from the base of the skull through the vertex without  intravenous contrast. COMPARISON:  December 03, 2011. FINDINGS: Bony calvarium appears intact. Moderate diffuse cortical atrophy is noted. Mild chronic ischemic white matter disease is noted. No mass effect or midline shift is noted. Ventricular size is within normal limits. There is no evidence of mass lesion, hemorrhage or acute infarction. IMPRESSION: Mild chronic ischemic white matter disease. Moderate diffuse cortical atrophy. No acute intracranial abnormality seen. Electronically Signed   By: Marijo Conception, M.D.   On: 03/25/2016 15:45    Microbiology: No results found for this or any previous visit (from the past 240 hour(s)).   Labs: Basic Metabolic Panel:  Recent Labs Lab 03/25/16 1709 03/26/16 0504  NA 141 142  K 4.3 4.0  CL 108 111  CO2 25 25  GLUCOSE 145* 131*  BUN 25* 21*  CREATININE 2.07* 1.73*  CALCIUM 8.9 8.9   Liver Function Tests:  Recent Labs Lab 03/25/16 1709  AST 22  ALT 22  ALKPHOS 74  BILITOT 0.7  PROT 6.8  ALBUMIN 3.8   No results for input(s): LIPASE, AMYLASE in the last 168 hours. No results for input(s): AMMONIA in the last 168 hours. CBC:  Recent Labs Lab 03/25/16 1550 03/26/16 0504  WBC 11.3* 8.9  NEUTROABS 9.4*  --   HGB 11.9* 10.3*  HCT 36.0* 33.0*  MCV 70.0* 69.8*  PLT 193 169   Cardiac Enzymes:  Recent Labs Lab 03/26/16 1049  TROPONINI <0.03   BNP: BNP (last 3 results) No results for input(s): BNP in the last 8760 hours.  ProBNP (last 3 results) No results for input(s): PROBNP in the last 8760 hours.  CBG:  Recent Labs Lab 03/26/16 0731 03/26/16 1155  GLUCAP 129* 136*       Signed:  Charlynne Cousins MD.  [facility] 03/26/2016, 4:46 PM

## 2016-03-26 NOTE — Progress Notes (Signed)
[facility] PROGRESS NOTE    Progress Note   Shelden Garguilo [id number] DOB: 17-Nov-1933 DOA: 03/25/2016 PCP: PROVIDER NOT IN SYSTEM   Brief Narrative:   [name] [name] is an 82 y.o. male with past medical history for hypertension diabetes mellitus type 2 chronic NEC stage III, and mild dementia that presents for syncopal event. He had been at the bathroom previously before the event.  Assessment/Plan:   Syncope: She is at no events on telemetry. On telemetry machine calls of V. tach it looks more like interference. Cardiac biomarkers in the ED were negative, his blood pressure has remained stable. Continue cycle cardiac markers. Leukocytosis is improved. 2-D echo is pending, to rule out aortic stenosis. If echo is normal consult cardiology.  Anemia of chronic disease Due to chronic renal disease, FOBT is pending.  Essential Hypertension Continue Norvasc pressure well controlled.   Acute kidney injury superimposed on chronic kidney disease [facility]): Baseline creatinine ranges from 1.4-1.8. She was on IV fluid hydration and is slowly improving the most likely causes prerenal. On admission her creatinine was 2.0.    DVT Prophylaxis - Lovenox ordered.  Family Communication: stable Disposition Plan: Home in 2-3 days Code Status:     Code Status Orders        Start     Ordered   03/25/16 2135  Full code   Continuous     03/25/16 2138    Code Status History    Date Active Date Inactive Code Status Order ID Comments User Context   11/06/2015  7:53 PM 11/07/2015  6:55 PM Full Code [id number]  Nita Sells, MD Inpatient   03/27/2015  4:46 PM 03/28/2015 10:01 PM Full Code [id number]  Robbie Lis, MD Inpatient   12/03/2011 12:06 AM 12/03/2011  2:39 PM Full Code [id number]  Pecola Leisure, RN Inpatient    Advance Directive Documentation        Most Recent Value   Type of Advance Directive  Healthcare Power of Attorney   Pre-existing out of facility DNR order (yellow  form or pink MOST form)     "MOST" Form in Place?          IV Access:    Peripheral IV   Procedures and diagnostic studies:   Dg Chest 2 View  03/25/2016  CLINICAL DATA:  Passed out in church today, history hypertension, prostate cancer, kidney stones EXAM: CHEST  2 VIEW COMPARISON:  11/06/2015 FINDINGS: Normal heart size, mediastinal contours, and pulmonary vascularity. Atherosclerotic calcification aorta. Lungs clear. Chronic elevation of LEFT diaphragm again noted. No pleural effusion or pneumothorax. Bones unremarkable. IMPRESSION: No acute abnormalities. Electronically Signed   By: Lavonia Dana M.D.   On: 03/25/2016 15:48   Ct Head Wo Contrast  03/25/2016  CLINICAL DATA:  Syncope. EXAM: CT HEAD WITHOUT CONTRAST TECHNIQUE: Contiguous axial images were obtained from the base of the skull through the vertex without intravenous contrast. COMPARISON:  December 03, 2011. FINDINGS: Bony calvarium appears intact. Moderate diffuse cortical atrophy is noted. Mild chronic ischemic white matter disease is noted. No mass effect or midline shift is noted. Ventricular size is within normal limits. There is no evidence of mass lesion, hemorrhage or acute infarction. IMPRESSION: Mild chronic ischemic white matter disease. Moderate diffuse cortical atrophy. No acute intracranial abnormality seen. Electronically Signed   By: Marijo Conception, M.D.   On: 03/25/2016 15:45     Medical Consultants:    None.  Anti-Infectives:   Anti-infectives    None  Subjective:    [place] Register   Objective:    Filed Vitals:   03/25/16 1930 03/25/16 2000 03/25/16 2156 03/26/16 0528  BP: 152/82 158/93 149/82 142/74  Pulse: 66 69 66 61  Temp:   98.3 F (36.8 C) 98.2 F (36.8 C)  TempSrc:   Oral Oral  Resp: 20 19 18 18   Height:   6\' 1"  (1.854 m)   Weight:   109.8 kg (242 lb 1 oz)   SpO2: 97% 97% 98% 98%    Intake/Output Summary (Last 24 hours) at 03/26/16 0936 Last data filed at 03/26/16 0720   Gross per 24 hour  Intake 431.25 ml  Output    651 ml  Net -219.75 ml   Filed Weights   03/25/16 2156  Weight: 109.8 kg (242 lb 1 oz)    Exam: Gen:  NAD Cardiovascular:  RRR. Chest and lungs:   Good air movement and clear to auscultation. Abdomen:  Abdomen soft, NT/ND, + BS Extremities:  No edema  Data Reviewed:    Labs: Basic Metabolic Panel:  Recent Labs Lab 03/25/16 1709 03/26/16 0504  NA 141 142  K 4.3 4.0  CL 108 111  CO2 25 25  GLUCOSE 145* 131*  BUN 25* 21*  CREATININE 2.07* 1.73*  CALCIUM 8.9 8.9   GFR Estimated Creatinine Clearance: 43.5 mL/min (by C-G formula based on Cr of 1.73). Liver Function Tests:  Recent Labs Lab 03/25/16 1709  AST 22  ALT 22  ALKPHOS 74  BILITOT 0.7  PROT 6.8  ALBUMIN 3.8   No results for input(s): LIPASE, AMYLASE in the last 168 hours. No results for input(s): AMMONIA in the last 168 hours. Coagulation profile No results for input(s): INR, PROTIME in the last 168 hours.  CBC:  Recent Labs Lab 03/25/16 1550 03/26/16 0504  WBC 11.3* 8.9  NEUTROABS 9.4*  --   HGB 11.9* 10.3*  HCT 36.0* 33.0*  MCV 70.0* 69.8*  PLT 193 169   Cardiac Enzymes: No results for input(s): CKTOTAL, CKMB, CKMBINDEX, TROPONINI in the last 168 hours. BNP (last 3 results) No results for input(s): PROBNP in the last 8760 hours. CBG:  Recent Labs Lab 03/26/16 0731  GLUCAP 129*   D-Dimer: No results for input(s): DDIMER in the last 72 hours. Hgb A1c: No results for input(s): HGBA1C in the last 72 hours. Lipid Profile: No results for input(s): CHOL, HDL, LDLCALC, TRIG, CHOLHDL, LDLDIRECT in the last 72 hours. Thyroid function studies:  Recent Labs  03/26/16 0503  TSH 2.669   Anemia work up:  Recent Labs  03/26/16 0503  TIBC 276  IRON 43*   Sepsis Labs:  Recent Labs Lab 03/25/16 1550 03/26/16 0504  WBC 11.3* 8.9   Microbiology No results found for this or any previous visit (from the past 240  hour(s)).   Medications:   . amLODipine  10 mg Oral Daily  . cholecalciferol  2,000 Units Oral Daily  . donepezil  10 mg Oral Daily  . enoxaparin (LOVENOX) injection  40 mg Subcutaneous Q24H  . insulin aspart  0-9 Units Subcutaneous TID WC   Continuous Infusions: . sodium chloride 75 mL/hr at 03/25/16 2203    Time spent: 25 min     FELIZ Marguarite Arbour  [facility] Pager [phone]  *Please refer to [email or web address], password [email or web address] to get updated schedule on who will round on this patient, as hospitalists switch teams weekly. If 7PM-7AM, please contact night-coverage at [email or web address], password [email or web address] for any overnight needs.  03/26/2016, 9:36 AM

## 2016-03-26 NOTE — Progress Notes (Signed)
Echocardiogram 2D Echocardiogram has been performed.  [name] [name] 03/26/2016, 3:21 PM

## 2016-06-20 ENCOUNTER — Encounter (HOSPITAL_COMMUNITY): Payer: Self-pay | Admitting: Emergency Medicine

## 2016-06-20 ENCOUNTER — Emergency Department (HOSPITAL_COMMUNITY)
Admission: EM | Admit: 2016-06-20 | Discharge: 2016-06-20 | Disposition: A | Discharge status: Home / Self Care | Payer: PPO | Attending: Emergency Medicine | Admitting: Emergency Medicine

## 2016-06-20 DIAGNOSIS — I10 Essential (primary) hypertension: Secondary | ICD-10-CM | POA: Insufficient documentation

## 2016-06-20 DIAGNOSIS — Z7984 Long term (current) use of oral hypoglycemic drugs: Secondary | ICD-10-CM | POA: Insufficient documentation

## 2016-06-20 DIAGNOSIS — R404 Transient alteration of awareness: Secondary | ICD-10-CM | POA: Diagnosis not present

## 2016-06-20 DIAGNOSIS — Z8546 Personal history of malignant neoplasm of prostate: Secondary | ICD-10-CM | POA: Insufficient documentation

## 2016-06-20 DIAGNOSIS — R55 Syncope and collapse: Secondary | ICD-10-CM | POA: Insufficient documentation

## 2016-06-20 DIAGNOSIS — Z79899 Other long term (current) drug therapy: Secondary | ICD-10-CM | POA: Insufficient documentation

## 2016-06-20 LAB — BASIC METABOLIC PANEL
ANION GAP: 6 (ref 5–15)
BUN: 22 mg/dL — ABNORMAL HIGH (ref 6–20)
CO2: 26 mmol/L (ref 22–32)
Calcium: 9.3 mg/dL (ref 8.9–10.3)
Chloride: 109 mmol/L (ref 101–111)
Creatinine, Ser: 2.05 mg/dL — ABNORMAL HIGH (ref 0.61–1.24)
GFR, EST AFRICAN AMERICAN: 33 mL/min — AB (ref >60)
GFR, EST NON AFRICAN AMERICAN: 29 mL/min — AB (ref >60)
GLUCOSE: 139 mg/dL — AB (ref 65–99)
POTASSIUM: 4.1 mmol/L (ref 3.5–5.1)
Sodium: 141 mmol/L (ref 135–145)

## 2016-06-20 LAB — CBC
HCT: 37.5 % — ABNORMAL LOW (ref 39.0–52.0)
HEMOGLOBIN: 11.8 g/dL — AB (ref 13.0–17.0)
MCH: 22.6 pg — ABNORMAL LOW (ref 26.0–34.0)
MCHC: 31.5 g/dL (ref 30.0–36.0)
MCV: 72 fL — ABNORMAL LOW (ref 78.0–100.0)
Platelets: 196 10*3/uL (ref 150–400)
RBC: 5.21 MIL/uL (ref 4.22–5.81)
RDW: 14.3 % (ref 11.5–15.5)
WBC: 9.1 10*3/uL (ref 4.0–10.5)

## 2016-06-20 LAB — CBG MONITORING, ED: Glucose-Capillary: 146 mg/dL — ABNORMAL HIGH (ref 65–99)

## 2016-06-20 MED ORDER — SODIUM CHLORIDE 0.9 % IV BOLUS (SEPSIS)
500.0000 mL | Freq: Once | INTRAVENOUS | Status: AC
  Administered 2016-06-20: 500 mL via INTRAVENOUS

## 2016-06-20 NOTE — Discharge Instructions (Signed)
Rest and stay hydrated.  Near-Syncope Near-syncope (commonly known as near fainting) is sudden weakness, dizziness, or feeling like you might pass out. During an episode of near-syncope, you may also develop pale skin, have tunnel vision, or feel sick to your stomach (nauseous). Near-syncope may occur when getting up after sitting or while standing for a long time. It is caused by a sudden decrease in blood flow to the brain. This decrease can result from various causes or triggers, most of which are not serious. However, because near-syncope can sometimes be a sign of something serious, a medical evaluation is required. The specific cause is often not determined. HOME CARE INSTRUCTIONS  Monitor your condition for any changes. The following actions may help to alleviate any discomfort you are experiencing:  Have someone stay with you until you feel stable.  Lie down right away and prop your feet up if you start feeling like you might faint. Breathe deeply and steadily. Wait until all the symptoms have passed. Most of these episodes last only a few minutes. You may feel tired for several hours.   Drink enough fluids to keep your urine clear or pale yellow.   If you are taking blood pressure or heart medicine, get up slowly when seated or lying down. Take several minutes to sit and then stand. This can reduce dizziness.  Follow up with your health care provider as directed. SEEK IMMEDIATE MEDICAL CARE IF:   You have a severe headache.   You have unusual pain in the chest, abdomen, or back.   You are bleeding from the mouth or rectum, or you have black or tarry stool.   You have an irregular or very fast heartbeat.   You have repeated fainting or have seizure-like jerking during an episode.   You faint when sitting or lying down.   You have confusion.   You have difficulty walking.   You have severe weakness.   You have vision problems.  MAKE SURE YOU:   Understand  these instructions.  Will watch your condition.  Will get help right away if you are not doing well or get worse.   This information is not intended to replace advice given to you by your health care provider. Make sure you discuss any questions you have with your health care provider.   Document Released: 12/10/2005 Document Revised: 12/15/2013 Document Reviewed: 05/15/2013 Elsevier Interactive Patient Education [employer].

## 2016-06-20 NOTE — ED Provider Notes (Signed)
CSN: [id number]     Arrival date & time 06/20/16  1640 History   First MD Initiated Contact with Patient 06/20/16 1651     Chief Complaint  Patient presents with  . Loss of Consciousness      HPI  She presents for evaluation of a near syncopal episode. He is complete by his wife. 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 He and his wife and accommodation walked across the street to the grave side. He was outside for approximately 30 minutes. Then the wife states that they were standing out front of the church talking. They were standing in line waiting for the "repass" meal.    He rechecked and grabbed hold of the arm of the general and neck to him. He became more lightheaded. He was helped to the ground. He did not have complete loss of consciousness but describes feeling very dizzy. Denies vertigo. Denies short of breath or chest pain. Did eat breakfast. Had not eaten since.  Has had 2 observation admissions, and one additional episode over the last year with similar episodes. 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  Past Medical History  Diagnosis Date  . Hypertension   . Stone, kidney   . Stone, kidney   . Cancer [facility])     prostate  . Gallstones    Past Surgical History  Procedure Laterality Date  . Prostate surgery     Family History  Problem Relation Age of Onset  . Lung cancer Sister    Social History  Substance Use Topics  . Smoking status: Former Smoker -- 0.50 packs/day for 20 years    Quit date: 12/02/1972  . Smokeless tobacco: Never Used  . Alcohol Use: No    Review of Systems  Constitutional: Negative for fever, chills, diaphoresis, appetite change and fatigue.  HENT: Negative for mouth sores, sore throat and trouble swallowing.   Eyes: Negative for visual disturbance.  Respiratory: Negative for cough, chest tightness, shortness of breath and wheezing.   Cardiovascular: Negative for chest  pain.  Gastrointestinal: Negative for nausea, vomiting, abdominal pain, diarrhea and abdominal distention.  Endocrine: Negative for polydipsia, polyphagia and polyuria.  Genitourinary: Negative for dysuria, frequency and hematuria.  Musculoskeletal: Negative for gait problem.  Skin: Negative for color change, pallor and rash.  Neurological: Positive for syncope. Negative for dizziness, light-headedness and headaches.  Hematological: Does not bruise/bleed easily.  Psychiatric/Behavioral: Negative for behavioral problems and confusion.      Allergies  Review of patient's allergies indicates no known allergies.  Home Medications   Prior to Admission medications   Medication Sig Start Date End Date Taking? Authorizing Provider  acetaminophen (TYLENOL) 500 MG tablet Take 500 mg by mouth every 6 (six) hours as needed for mild pain.   Yes Historical Provider, MD  amLODipine (NORVASC) 10 MG tablet Take 10 mg by mouth daily.     Yes Historical Provider, MD  Cholecalciferol (VITAMIN D3) 2000 UNITS TABS Take 2,000 Units by mouth daily.   Yes Historical Provider, MD  Cyanocobalamin (VITAMIN B 12 PO) Take 500 mcg by mouth daily.    Yes Historical Provider, MD  donepezil (ARICEPT) 10 MG tablet Take 10 mg by mouth every morning.    Yes Historical Provider, MD  glipiZIDE (GLUCOTROL) 5 MG tablet Take 5 mg by mouth 2 (two) times daily before a meal.   Yes Historical Provider, MD   BP 131/83 mmHg  Pulse  72  Temp(Src) 98.4 F (36.9 C) (Oral)  Resp 21  Ht 6' 1.5" (1.867 m)  Wt 242 lb (109.77 kg)  BMI 31.49 kg/m2  SpO2 97% Physical Exam  Constitutional: He is oriented to person, place, and time. He appears well-developed and well-nourished. No distress.  HENT:  Head: Normocephalic.  Eyes: Conjunctivae are normal. Pupils are equal, round, and reactive to light. No scleral icterus.  Neck: Normal range of motion. Neck supple. No thyromegaly present.  Cardiovascular: Normal rate and regular rhythm.   Exam reveals no gallop and no friction rub.   No murmur heard. Pulmonary/Chest: Effort normal and breath sounds normal. No respiratory distress. He has no wheezes. He has no rales.  Abdominal: Soft. Bowel sounds are normal. He exhibits no distension. There is no tenderness. There is no rebound.  Musculoskeletal: Normal range of motion.  Neurological: He is alert and oriented to person, place, and time.  Some mild dementia. Oriented to person and place. Knows the month. His wife does augment his history a fair amount. She states this is normal for him.  Skin: Skin is warm and dry. No rash noted.  Psychiatric: He has a normal mood and affect. His behavior is normal.    ED Course  Procedures (including critical care time) Labs Review Labs Reviewed  BASIC METABOLIC PANEL - Abnormal; Notable for the following:    Glucose, Bld 139 (*)    BUN 22 (*)    Creatinine, Ser 2.05 (*)    GFR calc non Af Amer 29 (*)    GFR calc Af Amer 33 (*)    All other components within normal limits  CBC - Abnormal; Notable for the following:    Hemoglobin 11.8 (*)    HCT 37.5 (*)    MCV 72.0 (*)    MCH 22.6 (*)    All other components within normal limits  CBG MONITORING, ED - Abnormal; Notable for the following:    Glucose-Capillary 146 (*)    All other components within normal limits    Imaging Review No results found. I have personally reviewed and evaluated these images and lab results as part of my medical decision-making.   EKG Interpretation   Date/Time:  Wednesday June 20 2016 16:41:25 EDT Ventricular Rate:  64 PR Interval:    QRS Duration: 101 QT Interval:  414 QTC Calculation: 428 R Axis:   -10 Text Interpretation:  Sinus rhythm Confirmed by Jeneen Rinks  MD, Maybell ([id number]) on  06/20/2016 5:11:22 PM      MDM   Final diagnoses:  Syncope, near    Likely some heat exhaustion, with his age, and mild renal insufficiency. We will ensure that his hemoglobin and kidney function are at his  baseline. EKG is unchanged. Hemolytically stable here. Plan fluids by mouth challenge, and reassessment.     Tanna Furry, MD 06/20/16 2019

## 2016-06-20 NOTE — ED Notes (Addendum)
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  Denies head/neck injury or pain. On arrrival--alert to person, event, year, - confused to day and month.

## 2016-06-20 NOTE — ED Notes (Signed)
CBG is 146.

## 2016-06-20 NOTE — ED Notes (Signed)
Pt placed on monitor upon arrival to rm D45,

## 2017-04-28 ENCOUNTER — Encounter (HOSPITAL_COMMUNITY): Payer: Self-pay | Admitting: Emergency Medicine

## 2017-04-28 ENCOUNTER — Emergency Department (HOSPITAL_COMMUNITY)
Admission: EM | Admit: 2017-04-28 | Discharge: 2017-04-28 | Disposition: A | Discharge status: Home / Self Care | Payer: PPO | Attending: Emergency Medicine | Admitting: Emergency Medicine

## 2017-04-28 DIAGNOSIS — R404 Transient alteration of awareness: Secondary | ICD-10-CM | POA: Diagnosis not present

## 2017-04-28 DIAGNOSIS — R531 Weakness: Secondary | ICD-10-CM | POA: Diagnosis not present

## 2017-04-28 DIAGNOSIS — R55 Syncope and collapse: Secondary | ICD-10-CM | POA: Diagnosis not present

## 2017-04-28 DIAGNOSIS — Z8546 Personal history of malignant neoplasm of prostate: Secondary | ICD-10-CM | POA: Insufficient documentation

## 2017-04-28 DIAGNOSIS — Z7982 Long term (current) use of aspirin: Secondary | ICD-10-CM | POA: Diagnosis not present

## 2017-04-28 DIAGNOSIS — Z79899 Other long term (current) drug therapy: Secondary | ICD-10-CM | POA: Diagnosis not present

## 2017-04-28 DIAGNOSIS — N184 Chronic kidney disease, stage 4 (severe): Secondary | ICD-10-CM | POA: Diagnosis not present

## 2017-04-28 DIAGNOSIS — E860 Dehydration: Secondary | ICD-10-CM | POA: Insufficient documentation

## 2017-04-28 DIAGNOSIS — I129 Hypertensive chronic kidney disease with stage 1 through stage 4 chronic kidney disease, or unspecified chronic kidney disease: Secondary | ICD-10-CM | POA: Insufficient documentation

## 2017-04-28 LAB — CBC WITH DIFFERENTIAL/PLATELET
Basophils Absolute: 0 10*3/uL (ref 0.0–0.1)
Basophils Relative: 0 %
Eosinophils Absolute: 0.1 10*3/uL (ref 0.0–0.7)
Eosinophils Relative: 1 %
HCT: 31.5 % — ABNORMAL LOW (ref 39.0–52.0)
Hemoglobin: 10 g/dL — ABNORMAL LOW (ref 13.0–17.0)
Lymphocytes Relative: 12 %
Lymphs Abs: 1 10*3/uL (ref 0.7–4.0)
MCH: 22.9 pg — ABNORMAL LOW (ref 26.0–34.0)
MCHC: 31.7 g/dL (ref 30.0–36.0)
MCV: 72.2 fL — ABNORMAL LOW (ref 78.0–100.0)
Monocytes Absolute: 0.6 10*3/uL (ref 0.1–1.0)
Monocytes Relative: 7 %
Neutro Abs: 6.6 10*3/uL (ref 1.7–7.7)
Neutrophils Relative %: 80 %
Platelets: 163 10*3/uL (ref 150–400)
RBC: 4.36 MIL/uL (ref 4.22–5.81)
RDW: 14.3 % (ref 11.5–15.5)
WBC: 8.3 10*3/uL (ref 4.0–10.5)

## 2017-04-28 LAB — BASIC METABOLIC PANEL
Anion gap: 4 — ABNORMAL LOW (ref 5–15)
BUN: 23 mg/dL — ABNORMAL HIGH (ref 6–20)
CO2: 23 mmol/L (ref 22–32)
Calcium: 8 mg/dL — ABNORMAL LOW (ref 8.9–10.3)
Chloride: 112 mmol/L — ABNORMAL HIGH (ref 101–111)
Creatinine, Ser: 2.02 mg/dL — ABNORMAL HIGH (ref 0.61–1.24)
GFR calc Af Amer: 34 mL/min — ABNORMAL LOW (ref >60)
GFR calc non Af Amer: 29 mL/min — ABNORMAL LOW (ref >60)
Glucose, Bld: 123 mg/dL — ABNORMAL HIGH (ref 65–99)
Potassium: 4 mmol/L (ref 3.5–5.1)
Sodium: 139 mmol/L (ref 135–145)

## 2017-04-28 MED ORDER — SODIUM CHLORIDE 0.9 % IV BOLUS (SEPSIS)
1000.0000 mL | Freq: Once | INTRAVENOUS | Status: AC
  Administered 2017-04-28: 1000 mL via INTRAVENOUS

## 2017-04-28 NOTE — ED Provider Notes (Signed)
[facility] DEPT Provider Note   CSN: [id number] Arrival date & time: 04/28/17  1357     History   Chief Complaint Chief Complaint  Patient presents with  . Loss of Consciousness    HPI [name] [name] is a 83 y.o. male with history of hypertension, recurrent syncopal episodes who presents following syncopal episode today. 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 He is unsure how long he lost consciousness. Patient reports he does not drink enough water and did not drink very much this morning. Patient has not eaten this morning. He states he does not normally eat until after church. He does state that he is hungry now. Patient denies any pain. He denies hitting his head. She denies any fevers, chest pain, shortness of breath, abdominal pain, nausea, vomiting, urinary symptoms, dizziness or lightheadedness. Initial orthostatic vital signs showed orthostatic hypotension by EMS.  HPI  Past Medical History:  Diagnosis Date  . Cancer [facility])    prostate  . Gallstones   . Hypertension   . Stone, kidney   . Stone, kidney     Patient Active Problem List   Diagnosis Date Noted  . Acute kidney injury superimposed on chronic kidney disease ([place]) 03/26/2016  . Syncope and collapse 11/06/2015  . Hypertension 11/06/2015  . Carotid sinus syncope or syndrome 11/06/2015  . Acute renal failure syndrome ([place])   . CKD (chronic kidney disease) stage 4, GFR 15-29 ml/min (HCC) 03/27/2015  . Anemia of chronic disease 03/27/2015  . Syncope 12/02/2011  . HTN (hypertension) 12/02/2011    Past Surgical History:  Procedure Laterality Date  . PROSTATE SURGERY         Home Medications    Prior to Admission medications   Medication Sig Start Date End Date Taking? Authorizing Provider  acetaminophen (TYLENOL) 500 MG tablet Take 500 mg by mouth every 6 (six) hours as needed for mild pain.   Yes [provider]  amLODipine (NORVASC)  10 MG tablet Take 10 mg by mouth daily.     Yes [provider]  aspirin EC 81 MG tablet Take 81 mg by mouth daily.   Yes [provider]  Cholecalciferol (VITAMIN D3) 2000 UNITS TABS Take 2,000 Units by mouth daily.   Yes [provider]  Cyanocobalamin (VITAMIN B 12 PO) Take 500 mcg by mouth daily.    Yes [provider]  donepezil (ARICEPT) 10 MG tablet Take 10 mg by mouth every morning.    Yes [provider]  glipiZIDE (GLUCOTROL) 5 MG tablet Take 5 mg by mouth 2 (two) times daily before a meal.   Yes [provider]  memantine (NAMENDA) 10 MG tablet Take 10 mg by mouth daily.   Yes [provider]  mirabegron ER (MYRBETRIQ) 50 MG TB24 tablet Take 50 mg by mouth daily.   Yes [provider]    Family History Family History  Problem Relation Age of Onset  . Lung cancer Sister     Social History Social History  Substance Use Topics  . Smoking status: Former Smoker    Packs/day: 0.50    Years: 20.00    Quit date: 12/02/1972  . Smokeless tobacco: Never Used  . Alcohol use No     Allergies   Patient has no known allergies.   Review of Systems Review of Systems  Constitutional: Negative for chills and fever.  HENT: Negative for facial swelling and sore throat.   Respiratory:  Negative for shortness of breath.   Cardiovascular: Negative for chest pain.  Gastrointestinal: Negative for abdominal pain, nausea and vomiting.  Genitourinary: Negative for dysuria.  Musculoskeletal: Negative for back pain.  Skin: Negative for rash and wound.  Neurological: Positive for syncope. Negative for headaches.  Psychiatric/Behavioral: The patient is not nervous/anxious.      Physical Exam Updated Vital Signs BP 132/79 (BP Location: Right Arm)   Pulse (!) 53   Temp 97.6 F (36.4 C) (Oral)   Resp 19   SpO2 95%   Physical Exam  Constitutional: He appears well-developed and well-nourished. No distress.  HENT:    Head: Normocephalic and atraumatic.  Mouth/Throat: Oropharynx is clear and moist. No oropharyngeal exudate.  Eyes: Conjunctivae and EOM are normal. Pupils are equal, round, and reactive to light. Right eye exhibits no discharge. Left eye exhibits no discharge. No scleral icterus.  Neck: Normal range of motion. Neck supple. No thyromegaly present.  Cardiovascular: Regular rhythm, normal heart sounds and intact distal pulses.  Bradycardia present.  Exam reveals no gallop and no friction rub.   No murmur heard. Pulmonary/Chest: Effort normal and breath sounds normal. No stridor. No respiratory distress. He has no wheezes. He has no rales.  Abdominal: Soft. Bowel sounds are normal. He exhibits no distension. There is no tenderness. There is no rebound and no guarding.  Musculoskeletal: He exhibits no edema.  Lymphadenopathy:    He has no cervical adenopathy.  Neurological: He is alert. Coordination normal.  CN 3-12 intact; normal sensation throughout; 5/5 strength in all 4 extremities; equal bilateral grip strength  Skin: Skin is warm and dry. No rash noted. He is not diaphoretic. No pallor.  Psychiatric: He has a normal mood and affect.  Nursing note and vitals reviewed.    ED Treatments / Results  Labs (all labs ordered are listed, but only abnormal results are displayed) Labs Reviewed  BASIC METABOLIC PANEL - Abnormal; Notable for the following:       Result Value   Chloride 112 (*)    Glucose, Bld 123 (*)    BUN 23 (*)    Creatinine, Ser 2.02 (*)    Calcium 8.0 (*)    GFR calc non Af Amer 29 (*)    GFR calc Af Amer 34 (*)    Anion gap 4 (*)    All other components within normal limits  CBC WITH DIFFERENTIAL/PLATELET - Abnormal; Notable for the following:    Hemoglobin 10.0 (*)    HCT 31.5 (*)    MCV 72.2 (*)    MCH 22.9 (*)    All other components within normal limits    EKG  EKG Interpretation  Date/Time:  Sunday Apr 28 2017 14:31:13 EDT Ventricular Rate:  59 PR  Interval:    QRS Duration: 99 QT Interval:  460 QTC Calculation: 456 R Axis:   -8 Text Interpretation:  Sinus rhythm Low voltage, precordial leads No significant change was found Confirmed by CAMPOS  MD, Lennette Bihari ([id number]) on 04/28/2017 2:33:48 PM       Radiology No results found.  Procedures Procedures (including critical care time)  Medications Ordered in ED Medications  sodium chloride 0.9 % bolus 1,000 mL (1,000 mLs Intravenous New Bag/Given 04/28/17 1452)     Initial Impression / Assessment and Plan / ED Course  I have reviewed the triage vital signs and the nursing notes.  Pertinent labs & imaging results that were available during my care of the patient were reviewed by me  and considered in my medical decision making (see chart for details).     CBC shows hemoglobin 10. BMP shows chloride 112, BUN 23, creatinine 2.02, calcium 8.0. Kidney function is stable from past. EKG shows NSR, no significant change. Patient given 800 mL fluid by EMS and 1 L in the ED. Patient's orthostatic vitals ED much improved. Patient ambulated without difficulty prior to discharge. Considering patient's multiple episodes of syncope and 2 admissions for same without significant findings, myself and Dr. Venora Maples feel patient is safe for discharge. Most likely caused by dehydration and overheated in church today. Follow-up to PCP this week. No recent comparison for hemoglobin, may be baseline but follow up with PCP for recheck. Patient and wife understand and agree with plan. Patient stable throughout ED course discharge in satisfactory condition. Patient also evaluated by Dr. Venora Maples who guided the patient's management and agrees with plan.  Final Clinical Impressions(s) / ED Diagnoses   Final diagnoses:  Syncope and collapse  Dehydration    New Prescriptions New Prescriptions   No medications on file     Caryl Ada 04/28/17 [place], Kevin, MD 04/28/17 1558

## 2017-04-28 NOTE — Discharge Instructions (Signed)
Make sure you are drinking plenty of water and eating regularly. Please follow-up with your primary care provider this week for further evaluation and recheck of hemoglobin. It was 10.0 today. This may be normal for you but we do not have a recent number to compare to. Please return to emergency department if you develop any new or worsening symptoms.

## 2017-04-28 NOTE — ED Triage Notes (Signed)
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 Hx of syncopal episodes secondary to dehydration. The church was significantly hot and pt admits to lack of taking in proper fluids. Pt fell in the pulpit but is denying any pain. No obvious injuries.  12 lead was unremarkable. Positive orthostatic changes.  106/70 in a lying position and 96/70 upon sitting up.   18 g Left AC 800 NS Last vitals  129/76 Pulse low to mid 50's, is on amlodipine

## 2017-04-28 NOTE — ED Notes (Signed)
Pt ambulated without assistance. Pt was steady on his feet

## 2017-04-28 NOTE — ED Notes (Signed)
Bed: WA06 Expected date:  Expected time:  Means of arrival:  Comments: 43 M syncope

## 2017-08-26 DIAGNOSIS — R031 Nonspecific low blood-pressure reading: Secondary | ICD-10-CM | POA: Diagnosis not present

## 2017-11-24 ENCOUNTER — Encounter (HOSPITAL_BASED_OUTPATIENT_CLINIC_OR_DEPARTMENT_OTHER): Payer: Self-pay | Admitting: *Deleted

## 2017-11-24 ENCOUNTER — Emergency Department (HOSPITAL_BASED_OUTPATIENT_CLINIC_OR_DEPARTMENT_OTHER)
Admission: EM | Admit: 2017-11-24 | Discharge: 2017-11-24 | Disposition: A | Discharge status: Home / Self Care | Payer: PPO | Attending: Emergency Medicine | Admitting: Emergency Medicine

## 2017-11-24 ENCOUNTER — Emergency Department (HOSPITAL_BASED_OUTPATIENT_CLINIC_OR_DEPARTMENT_OTHER): Payer: PPO

## 2017-11-24 ENCOUNTER — Other Ambulatory Visit: Payer: Self-pay

## 2017-11-24 DIAGNOSIS — Z79899 Other long term (current) drug therapy: Secondary | ICD-10-CM | POA: Diagnosis not present

## 2017-11-24 DIAGNOSIS — M25561 Pain in right knee: Secondary | ICD-10-CM | POA: Insufficient documentation

## 2017-11-24 DIAGNOSIS — J181 Lobar pneumonia, unspecified organism: Secondary | ICD-10-CM | POA: Insufficient documentation

## 2017-11-24 DIAGNOSIS — J189 Pneumonia, unspecified organism: Secondary | ICD-10-CM | POA: Diagnosis not present

## 2017-11-24 DIAGNOSIS — N184 Chronic kidney disease, stage 4 (severe): Secondary | ICD-10-CM | POA: Insufficient documentation

## 2017-11-24 DIAGNOSIS — I129 Hypertensive chronic kidney disease with stage 1 through stage 4 chronic kidney disease, or unspecified chronic kidney disease: Secondary | ICD-10-CM | POA: Insufficient documentation

## 2017-11-24 DIAGNOSIS — Z7984 Long term (current) use of oral hypoglycemic drugs: Secondary | ICD-10-CM | POA: Insufficient documentation

## 2017-11-24 DIAGNOSIS — R531 Weakness: Secondary | ICD-10-CM | POA: Insufficient documentation

## 2017-11-24 DIAGNOSIS — Z8546 Personal history of malignant neoplasm of prostate: Secondary | ICD-10-CM | POA: Insufficient documentation

## 2017-11-24 DIAGNOSIS — G8929 Other chronic pain: Secondary | ICD-10-CM | POA: Diagnosis not present

## 2017-11-24 DIAGNOSIS — Z87891 Personal history of nicotine dependence: Secondary | ICD-10-CM | POA: Diagnosis not present

## 2017-11-24 DIAGNOSIS — R05 Cough: Secondary | ICD-10-CM | POA: Diagnosis not present

## 2017-11-24 MED ORDER — ALBUTEROL SULFATE HFA 108 (90 BASE) MCG/ACT IN AERS
2.0000 | INHALATION_SPRAY | RESPIRATORY_TRACT | Status: DC | PRN
  Administered 2017-11-24: 2 via RESPIRATORY_TRACT
  Filled 2017-11-24: qty 6.7

## 2017-11-24 MED ORDER — AMOXICILLIN-POT CLAVULANATE 875-125 MG PO TABS: 1.0000 | ORAL_TABLET | Freq: Two times a day (BID) | ORAL | 0 refills | Status: DC

## 2017-11-24 NOTE — ED Provider Notes (Signed)
[facility] EMERGENCY DEPARTMENT Provider Note   CSN: [id number] Arrival date & time: 11/24/17  1247     History   Chief Complaint Chief Complaint  Patient presents with  . Cough    HPI [name] [name] is a 83 y.o. male.  HPI Patient has had a cough and felt weak for the last around 3 days.  No real sputum production but patient's wife says there is some there that needs to come up.  No fevers.  Mildly decreased appetite but overall has good oral intake.  Decreased energy level.  No swelling in his legs but has chronic right knee pain due to bone-on-bone.  No fevers.  He does not smoke.  No abdominal pain.  No chest pain. Past Medical History:  Diagnosis Date  . Cancer [facility])    prostate  . Gallstones   . Hypertension   . Stone, kidney   . Stone, kidney     Patient Active Problem List   Diagnosis Date Noted  . Acute kidney injury superimposed on chronic kidney disease ([place]) 03/26/2016  . Syncope and collapse 11/06/2015  . Hypertension 11/06/2015  . Carotid sinus syncope or syndrome 11/06/2015  . Acute renal failure syndrome ([place])   . CKD (chronic kidney disease) stage 4, GFR 15-29 ml/min (HCC) 03/27/2015  . Anemia of chronic disease 03/27/2015  . Syncope 12/02/2011  . HTN (hypertension) 12/02/2011    Past Surgical History:  Procedure Laterality Date  . PROSTATE SURGERY         Home Medications    Prior to Admission medications   Medication Sig Start Date End Date Taking? Authorizing Provider  acetaminophen (TYLENOL) 500 MG tablet Take 500 mg by mouth every 6 (six) hours as needed for mild pain.    [provider]  amLODipine (NORVASC) 10 MG tablet Take 10 mg by mouth daily.      [provider]  amoxicillin-clavulanate (AUGMENTIN) 875-125 MG tablet Take 1 tablet by mouth every 12 (twelve) hours. 11/24/17   Davonna Belling, MD  aspirin EC 81 MG tablet Take 81 mg by mouth daily.    [provider]  Cholecalciferol (VITAMIN  D3) 2000 UNITS TABS Take 2,000 Units by mouth daily.    [provider]  Cyanocobalamin (VITAMIN B 12 PO) Take 500 mcg by mouth daily.     [provider]  donepezil (ARICEPT) 10 MG tablet Take 10 mg by mouth every morning.     [provider]  glipiZIDE (GLUCOTROL) 5 MG tablet Take 5 mg by mouth 2 (two) times daily before a meal.    [provider]  memantine (NAMENDA) 10 MG tablet Take 10 mg by mouth daily.    [provider]  mirabegron ER (MYRBETRIQ) 50 MG TB24 tablet Take 50 mg by mouth daily.    [provider]    Family History Family History  Problem Relation Age of Onset  . Lung cancer Sister     Social History Social History   Tobacco Use  . Smoking status: Former Smoker    Packs/day: 0.50    Years: 20.00    Pack years: 10.00    Last attempt to quit: 12/02/1972    Years since quitting: 45.0  . Smokeless tobacco: Never Used  Substance Use Topics  . Alcohol use: No  . Drug use: No     Allergies   Patient has no known allergies.   Review of Systems Review of Systems  Constitutional: Positive for appetite  change and fatigue. Negative for fever.  HENT: Negative for congestion.   Respiratory: Positive for cough and shortness of breath.   Cardiovascular: Negative for chest pain.  Gastrointestinal: Negative for abdominal pain.  Genitourinary: Negative for dysuria.  Musculoskeletal: Negative for back pain.  Neurological: Negative for numbness.  Hematological: Negative for adenopathy.  Psychiatric/Behavioral: Negative for confusion.     Physical Exam Updated Vital Signs BP (!) 151/83 (BP Location: Left Arm)   Pulse 76   Temp 98.3 F (36.8 C) (Oral)   Resp 18   Ht 5\' 8"  (1.727 m)   Wt 108.9 kg (240 lb)   SpO2 95%   BMI 36.49 kg/m   Physical Exam  Constitutional: He appears well-developed.  HENT:  Head: Normocephalic.  Eyes: Pupils are equal, round, and reactive to light.  Pulmonary/Chest: No  respiratory distress.  Few scattered rales, worse on right side.  Abdominal: There is no tenderness.  Musculoskeletal: He exhibits no tenderness.  Neurological: He is alert.  Skin: Skin is warm. Capillary refill takes less than 2 seconds.  Psychiatric: He has a normal mood and affect.     ED Treatments / Results  Labs (all labs ordered are listed, but only abnormal results are displayed) Labs Reviewed - No data to display  EKG  EKG Interpretation None       Radiology Dg Chest 2 View  Result Date: 11/24/2017 CLINICAL DATA:  Cough, congestion EXAM: CHEST  2 VIEW COMPARISON:  03/25/2016 FINDINGS: Elevation of the left hemidiaphragm is stable. Heart is upper limits normal in size. Patchy opacity in the right upper lobe compatible with pneumonia. No effusions or acute bony abnormality. IMPRESSION: Right upper lobe pneumonia. Electronically Signed   By: Rolm Baptise M.D.   On: 11/24/2017 14:06    Procedures Procedures (including critical care time)  Medications Ordered in ED Medications  albuterol (PROVENTIL HFA;VENTOLIN HFA) 108 (90 Base) MCG/ACT inhaler 2 puff (2 puffs Inhalation Given 11/24/17 1554)     Initial Impression / Assessment and Plan / ED Course  I have reviewed the triage vital signs and the nursing notes.  Pertinent labs & imaging results that were available during my care of the patient were reviewed by me and considered in my medical decision making (see chart for details).     Patient with shortness of breath and fatigue.  Well-appearing.  X-ray shows pneumonia.  Clinically this does fit the picture and he does not appear to be ill at this time.  Will give antibiotics and an inhaler.  Wife states that she has heard wheezing at home although there is no current wheezing at this time.  Will follow up with his primary care doctor as needed.  Final Clinical Impressions(s) / ED Diagnoses   Final diagnoses:  Community acquired pneumonia of right middle lobe of  lung [facility])    ED Discharge Orders        Ordered    amoxicillin-clavulanate (AUGMENTIN) 875-125 MG tablet  Every 12 hours     11/24/17 1552       Davonna Belling, MD 11/24/17 1557

## 2017-11-24 NOTE — ED Triage Notes (Signed)
Pt c/o cough x 4 days

## 2017-11-24 NOTE — ED Notes (Signed)
ED Provider at bedside. 

## 2018-01-17 ENCOUNTER — Emergency Department (HOSPITAL_COMMUNITY): Payer: PPO

## 2018-01-17 ENCOUNTER — Encounter (HOSPITAL_COMMUNITY): Payer: Self-pay

## 2018-01-17 ENCOUNTER — Emergency Department (HOSPITAL_COMMUNITY)
Admission: EM | Admit: 2018-01-17 | Discharge: 2018-01-17 | Disposition: A | Discharge status: Home / Self Care | Payer: PPO | Attending: Emergency Medicine | Admitting: Emergency Medicine

## 2018-01-17 DIAGNOSIS — R55 Syncope and collapse: Secondary | ICD-10-CM | POA: Insufficient documentation

## 2018-01-17 DIAGNOSIS — R404 Transient alteration of awareness: Secondary | ICD-10-CM | POA: Diagnosis not present

## 2018-01-17 DIAGNOSIS — Z7984 Long term (current) use of oral hypoglycemic drugs: Secondary | ICD-10-CM | POA: Insufficient documentation

## 2018-01-17 DIAGNOSIS — Z79899 Other long term (current) drug therapy: Secondary | ICD-10-CM | POA: Insufficient documentation

## 2018-01-17 DIAGNOSIS — Z87891 Personal history of nicotine dependence: Secondary | ICD-10-CM | POA: Diagnosis not present

## 2018-01-17 DIAGNOSIS — Z8546 Personal history of malignant neoplasm of prostate: Secondary | ICD-10-CM | POA: Insufficient documentation

## 2018-01-17 DIAGNOSIS — I129 Hypertensive chronic kidney disease with stage 1 through stage 4 chronic kidney disease, or unspecified chronic kidney disease: Secondary | ICD-10-CM | POA: Diagnosis not present

## 2018-01-17 DIAGNOSIS — R531 Weakness: Secondary | ICD-10-CM | POA: Diagnosis not present

## 2018-01-17 DIAGNOSIS — R918 Other nonspecific abnormal finding of lung field: Secondary | ICD-10-CM | POA: Diagnosis not present

## 2018-01-17 DIAGNOSIS — N184 Chronic kidney disease, stage 4 (severe): Secondary | ICD-10-CM | POA: Insufficient documentation

## 2018-01-17 LAB — COMPREHENSIVE METABOLIC PANEL
ALT: 14 U/L — ABNORMAL LOW (ref 17–63)
ANION GAP: 7 (ref 5–15)
AST: 18 U/L (ref 15–41)
Albumin: 3.3 g/dL — ABNORMAL LOW (ref 3.5–5.0)
Alkaline Phosphatase: 99 U/L (ref 38–126)
BUN: 23 mg/dL — ABNORMAL HIGH (ref 6–20)
CO2: 25 mmol/L (ref 22–32)
CREATININE: 2.18 mg/dL — AB (ref 0.61–1.24)
Calcium: 8.1 mg/dL — ABNORMAL LOW (ref 8.9–10.3)
Chloride: 105 mmol/L (ref 101–111)
GFR, EST AFRICAN AMERICAN: 31 mL/min — AB (ref >60)
GFR, EST NON AFRICAN AMERICAN: 26 mL/min — AB (ref >60)
Glucose, Bld: 301 mg/dL — ABNORMAL HIGH (ref 65–99)
Potassium: 4.1 mmol/L (ref 3.5–5.1)
SODIUM: 137 mmol/L (ref 135–145)
Total Bilirubin: 0.5 mg/dL (ref 0.3–1.2)
Total Protein: 6.3 g/dL — ABNORMAL LOW (ref 6.5–8.1)

## 2018-01-17 LAB — CBC WITH DIFFERENTIAL/PLATELET
BASOS ABS: 0.1 10*3/uL (ref 0.0–0.1)
Basophils Relative: 1 %
EOS PCT: 2 %
Eosinophils Absolute: 0.1 10*3/uL (ref 0.0–0.7)
HCT: 35 % — ABNORMAL LOW (ref 39.0–52.0)
Hemoglobin: 10.8 g/dL — ABNORMAL LOW (ref 13.0–17.0)
LYMPHS ABS: 1.2 10*3/uL (ref 0.7–4.0)
Lymphocytes Relative: 18 %
MCH: 22.3 pg — ABNORMAL LOW (ref 26.0–34.0)
MCHC: 30.9 g/dL (ref 30.0–36.0)
MCV: 72.2 fL — AB (ref 78.0–100.0)
Monocytes Absolute: 0.5 10*3/uL (ref 0.1–1.0)
Monocytes Relative: 8 %
NEUTROS PCT: 71 %
Neutro Abs: 4.7 10*3/uL (ref 1.7–7.7)
PLATELETS: 152 10*3/uL (ref 150–400)
RBC: 4.85 MIL/uL (ref 4.22–5.81)
RDW: 14.3 % (ref 11.5–15.5)
WBC: 6.6 10*3/uL (ref 4.0–10.5)

## 2018-01-17 LAB — CBG MONITORING, ED
GLUCOSE-CAPILLARY: 297 mg/dL — AB (ref 65–99)
GLUCOSE-CAPILLARY: 178 mg/dL — AB (ref 65–99)

## 2018-01-17 LAB — TROPONIN I

## 2018-01-17 MED ORDER — SODIUM CHLORIDE 0.9 % IV SOLN
INTRAVENOUS | Status: DC
  Administered 2018-01-17: 10:00:00 via INTRAVENOUS

## 2018-01-17 NOTE — ED Notes (Signed)
ED Provider at bedside. 

## 2018-01-17 NOTE — ED Triage Notes (Addendum)
Patient arrived via [facility] from home. Lives with wife. Patient was sitting at kitchen table. Wife found patient on bar stool leaned over on the counter and patient said he felt like he was about to pass out but had not yet (near syncope). Ambulates normally at home. 500 mL fluid. Initial BP-100/62, HR-80. Hx. Of alzheimers. HX. Chronic dehydration. Hx. syncope episode about 8 months from dehydration. No pain, no Shob. Patient c/o of weakness and not feeling well.

## 2018-01-17 NOTE — ED Notes (Signed)
Bed: WA17 Expected date:  Expected time:  Means of arrival:  Comments: EMS-syncope

## 2018-01-17 NOTE — Discharge Instructions (Signed)
As discussed, your evaluation today has been largely reassuring.  But, it is important that you monitor your condition carefully, and do not hesitate to return to the ED if you develop new, or concerning changes in your condition. ? ?Otherwise, please follow-up with your physician for appropriate ongoing care. ? ?

## 2018-01-17 NOTE — ED Provider Notes (Signed)
[facility] DEPT Provider Note   CSN: [id number] Arrival date & time: 01/17/18  1914     History   Chief Complaint Chief Complaint  Patient presents with  . Near Syncope    HPI [name] [name] is a 84 y.o. male.  HPI Patient presents with his wife who assists with the HPI. Presents after an episode of syncope. Patient has multiple medical issues including diabetes, hypertension. He has also had multiple prior syncope episodes, typically attributed to dehydration and/or heat exposure. However, today the patient was in his usual state of health, eating breakfast, when he was witnessed by his wife to have an episode of apparent syncope at the breakfast table. The patient himself denies any pain prior to or after the event, but is unsure of the exact circumstances. He currently states that he feels fine, with no ongoing complaints. During prior evaluations following syncope episodes, he has been diagnosed with dehydration. He has no known arrhythmia. Past Medical History:  Diagnosis Date  . Cancer [facility])    prostate  . Gallstones   . Hypertension   . Stone, kidney   . Stone, kidney     Patient Active Problem List   Diagnosis Date Noted  . Acute kidney injury superimposed on chronic kidney disease ([place]) 03/26/2016  . Syncope and collapse 11/06/2015  . Hypertension 11/06/2015  . Carotid sinus syncope or syndrome 11/06/2015  . Acute renal failure syndrome ([place])   . CKD (chronic kidney disease) stage 4, GFR 15-29 ml/min (HCC) 03/27/2015  . Anemia of chronic disease 03/27/2015  . Syncope 12/02/2011  . HTN (hypertension) 12/02/2011    Past Surgical History:  Procedure Laterality Date  . PROSTATE SURGERY         Home Medications    Prior to Admission medications   Medication Sig Start Date End Date Taking? Authorizing Provider  acetaminophen (TYLENOL) 500 MG tablet Take 500 mg by mouth every 6 (six) hours as needed for mild pain.     [provider]  amLODipine (NORVASC) 10 MG tablet Take 10 mg by mouth daily.      [provider]  amoxicillin-clavulanate (AUGMENTIN) 875-125 MG tablet Take 1 tablet by mouth every 12 (twelve) hours. 11/24/17   Davonna Belling, MD  aspirin EC 81 MG tablet Take 81 mg by mouth daily.    [provider]  Cholecalciferol (VITAMIN D3) 2000 UNITS TABS Take 2,000 Units by mouth daily.    [provider]  Cyanocobalamin (VITAMIN B 12 PO) Take 500 mcg by mouth daily.     [provider]  donepezil (ARICEPT) 10 MG tablet Take 10 mg by mouth every morning.     [provider]  glipiZIDE (GLUCOTROL) 5 MG tablet Take 5 mg by mouth 2 (two) times daily before a meal.    [provider]  memantine (NAMENDA) 10 MG tablet Take 10 mg by mouth daily.    [provider]  mirabegron ER (MYRBETRIQ) 50 MG TB24 tablet Take 50 mg by mouth daily.    [provider]    Family History Family History  Problem Relation Age of Onset  . Lung cancer Sister     Social History Social History   Tobacco Use  . Smoking status: Former Smoker    Packs/day: 0.50    Years: 20.00    Pack years: 10.00    Last attempt to quit: 12/02/1972    Years since quitting: 45.1  . Smokeless tobacco: Never Used  Substance Use Topics  . Alcohol use: No  . Drug use: No     Allergies   Patient has no known allergies.   Review of Systems Review of Systems  Constitutional:       Per HPI, otherwise negative  HENT:       Per HPI, otherwise negative  Respiratory:       Per HPI, otherwise negative  Cardiovascular:       Per HPI, otherwise negative  Gastrointestinal: Negative for vomiting.  Endocrine:       Negative aside from HPI  Genitourinary:       Neg aside from HPI   Musculoskeletal:       Per HPI, otherwise negative  Skin: Negative.   Neurological: Positive for syncope.     Physical Exam Updated Vital Signs BP (!) 143/88   Pulse  64   Temp 97.8 F (36.6 C) (Oral)   Resp 18   SpO2 98%   Physical Exam  Constitutional: He is oriented to person, place, and time. He appears well-developed. No distress.  HENT:  Head: Normocephalic and atraumatic.  Eyes: Conjunctivae and EOM are normal.  Cardiovascular: Normal rate and regular rhythm.  Pulmonary/Chest: Effort normal. No stridor. No respiratory distress.  Abdominal: He exhibits no distension.  Musculoskeletal: He exhibits no edema.  Neurological: He is alert and oriented to person, place, and time.  Skin: Skin is warm and dry.  Psychiatric: He has a normal mood and affect.  Nursing note and vitals reviewed.    ED Treatments / Results  Labs (all labs ordered are listed, but only abnormal results are displayed) Labs Reviewed  COMPREHENSIVE METABOLIC PANEL - Abnormal; Notable for the following components:      Result Value   Glucose, Bld 301 (*)    BUN 23 (*)    Creatinine, Ser 2.18 (*)    Calcium 8.1 (*)    Total Protein 6.3 (*)    Albumin 3.3 (*)    ALT 14 (*)    GFR calc non Af Amer 26 (*)    GFR calc Af Amer 31 (*)    All other components within normal limits  CBC WITH DIFFERENTIAL/PLATELET - Abnormal; Notable for the following components:   Hemoglobin 10.8 (*)    HCT 35.0 (*)    MCV 72.2 (*)    MCH 22.3 (*)    All other components within normal limits  CBG MONITORING, ED - Abnormal; Notable for the following components:   Glucose-Capillary 297 (*)    All other components within normal limits  TROPONIN I    EKG  EKG Interpretation None       EMS rhythm strip with sinus rhythm, rate 54, substantial artifact, but grossly unremarkable  Radiology Dg Chest 2 View  Result Date: 01/17/2018 CLINICAL DATA:  Near syncope. EXAM: CHEST  2 VIEW COMPARISON:  11/24/2017.  03/25/2016. FINDINGS: Mediastinum and hilar structures normal. Heart size normal. Previously identified infiltrate right upper lobe is clear. Low lung volumes with mild bibasilar  atelectasis/scarring again noted. Stable elevation left hemidiaphragm. IMPRESSION: 1.  Previously identified infiltrate right upper lobe is clear. 2. Low lung volumes with mild bibasilar atelectasis/scarring again noted. Stable elevation left hemidiaphragm. Electronically Signed   By: Marcello Moores  Register   On: 01/17/2018 11:24    Procedures Procedures (including critical care time)  Medications Ordered in ED Medications  0.9 %  sodium chloride infusion ( Intravenous New Bag/Given 01/17/18 1010)     Initial Impression / Assessment  and Plan / ED Course  I have reviewed the triage vital signs and the nursing notes.  Pertinent labs & imaging results that were available during my care of the patient were reviewed by me and considered in my medical decision making (see chart for details).     12:03 PM Review of patient's chart demonstrates hospital this 1 year ago for similar episode. Echocardiogram, from that admission as below Impressions:   - LVEF 60-65%, mild LVH, moderate focal basal septal hypertrophy,   normal wall motion, diastolic dysfunction, indeterminate LV   filling pressure, severe LAE, mild RAE, trivial TR, normal RVSP,   normal IVC.  Patient himself remains in no distress, awake, alert, with no ongoing complaints. Evaluation here reassuring, with demonstration of persistent chronic kidney disease, no acute exacerbation, though slight progression. Patient has no evidence from a coronary ischemia, and given his history of recurrent syncope, without other alarming findings today, patient will follow up with cardiology. No evidence for PE, pneumonia, sepsis, bacteremia, sustained arrhythmia, and there is some suspicion for medication effect versus CKD, versus dehydration.  Final Clinical Impressions(s) / ED Diagnoses   Syncope   Carmin Muskrat, MD 01/17/18 1204

## 2018-07-10 ENCOUNTER — Other Ambulatory Visit: Payer: Self-pay

## 2018-07-10 NOTE — Patient Outreach (Signed)
[facility]) Care Management  07/10/2018  [name] [name] Dec 03, 1933 [id number]   Referral Date: 07-10-18 Referral Source: HTA concierge Referral Reason: Patient has alzheimer's and wife looking for programs that patient qualifies for.   Outreach Attempt #1 Spoke with wife.  She states that she cannot talk at this moment and who call CM right back. CM contact information given.      Plan: RN CM will wait return call from wife.  If no return call will send letter and attempt again within 4 business days.     Jone Baseman, RN, MSN [facility] Care Management Care Management Coordinator Direct Line [phone] Toll Free: [phone]  Fax: [phone]

## 2018-07-11 ENCOUNTER — Encounter: Payer: Self-pay | Admitting: *Deleted

## 2018-07-11 ENCOUNTER — Other Ambulatory Visit: Payer: Self-pay | Admitting: *Deleted

## 2018-07-11 ENCOUNTER — Other Ambulatory Visit: Payer: Self-pay

## 2018-07-11 NOTE — Patient Outreach (Signed)
[facility]) Care Management  07/11/2018  [name] [name] 05-31-1933 [id number]   CSW made an initial attempt to try and contact patient's wife, Carlito Bogert today to perform the initial phone assessment on patient, as well as assess and assist with social work needs and services, without success.  A HIPAA compliant message was left for [name] on voicemail.  CSW is currently awaiting a return call.  CSW will make a second outreach attempt within the next 3-4 business days, if a return call is not received from [name] in the meantime.  CSW will also mail an Outreach Letter to [name]'s home requesting that she contact CSW if she is interested in receiving social work services through [place] with [facility]. Nat Christen, BSW, MSW, LCSW  Licensed Education officer, [facility]  Mailing [place] [address] [place], [place] [address] Physical Address-[address], [place], [place] [address] Toll Free Main # [phone] Fax # [phone] Cell # [phone]  Office # [phone] [email or web address]

## 2018-07-11 NOTE — Patient Outreach (Signed)
[place] [name]) Care Management  07/11/2018  [name] [name] [birth year] [id number]   Referral Date: 07/11/18 Referral Source: HTA Concierge  Referral Reason: Patient has alzheimer's and wife looking for programs that patient qualifies for.   Outreach Attempt spoke with spouse. She is able to verify HIPAA.  Discussed reason for referral.  She states that she is looking for resources for patient. In talking with wife she reports being overwhelmed with patient care.  Patient sees [place] doctor, Dr. Landry Mellow as PCP.    Social: Patient lives in the home with wife who is his primary care giver. 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  Wife reports that patient needs assistance with all aspects of care.   Conditions: Patient has alzheimer's that has been slowly progressing.  She reports that patient has some instance of incontinence. She also reports that patient has HTN and diabetes.  She reports that his sugars have been climbing slowly.  Discussed diabetes management with spouse she reports that she gets nervous when his sugars climb and has reached out to the physician in the past.    Medications: Patient takes medications as prescribed but wife reports that patient has problems affording medications and is using VA payment plan for medications.  But she states that the bill is climbing due to the expense of medications.    Appointments:  Patient saw [place] doctor, Dr. Landry Mellow last month.     Advanced Directives: Patient does not have an advanced directive.     Consent: Discussed with wife [facility] services.  She is agreeable to Engineer, maintenance, social work, and pharmacy.     Plan:  RN CM will refer to health coach for disease management and support of diabetes.   RN CM will refer patient to social work for care resources. RN CM will refer to pharmacy for cheaper alternatives for medications.     Jone Baseman, RN, MSN [facility] Care Management Care Management  Coordinator Direct Line [phone] Toll Free: [phone]  Fax: [phone]

## 2018-07-14 ENCOUNTER — Other Ambulatory Visit: Payer: Self-pay | Admitting: Pharmacist

## 2018-07-14 NOTE — Patient Outreach (Signed)
[place] [facility]) Care Management  07/14/2018  [name] [name] 01/07/1933 [id number]   84 year old male referred for [facility] services by HTA concierge.  [facility] pharmacy referral placed for medication assistance.    Unsuccessful call placed to both patient and spouse today.  I left a HIPAA compliant voicemail on patient's home phone.  No voicemail set up on spouse's phone number.   Noted [facility] unsuccessful outreach letter already mailed on 7/19 by [facility] LCSW.   Plan: I will follow-up with patient and spouse in 3-4 business days if I have not heard back beforehand .  Ralene Bathe, PharmD, [place] [phone]

## 2018-07-16 ENCOUNTER — Encounter: Payer: Self-pay | Admitting: *Deleted

## 2018-07-18 ENCOUNTER — Other Ambulatory Visit: Payer: Self-pay

## 2018-07-18 ENCOUNTER — Other Ambulatory Visit: Payer: Self-pay | Admitting: Pharmacist

## 2018-07-18 NOTE — Patient Outreach (Signed)
[place] [facility]) Care Management  07/18/2018  [name] [name] 1933-07-29 [id number]  BSW attempted to contact patient's wife regarding recent referral for caregiver resources. Unfortunately today's call was unsuccessful. BSW left a  HIPAA compliant voice message requesting a return call.  Plan: Upon chart review it is noted another member of the social work team has previously mailed the patient an unsuccessful Economist. BSW will not send a letter on today's date. BSW will attempt patient again within the next four business days.  [name] [name], BSW, CDP [facility] [phone]

## 2018-07-18 NOTE — Patient Outreach (Signed)
[place] [facility]) Care Management  07/18/2018  [name] [name] [birth year] [id number]  2nd call attempt to [name] today.  Home phone rang and then disconnected.  Spouse answered mobile phone listed.  HIPAA identifiers verified. Spouse reports she is trying to look into having personal care services for patient to assist with bathing and sitting with him for a few hours.  She also states she is on a payment plan for medications at the [place] but does not know co-pays.  She is unable to find information during phone call.  Per review of medication list in [facility], all medications should be tier 1-2 on HTA formulary.  I offered to call [place] but spouse does not have the contact information.    Plan: Spouse will return my call later today or next week once she has more information on co-pays and payment plan. I will reach out to her next week if I have not heard back yet.   Ralene Bathe, PharmD, [place] [phone]

## 2018-07-21 ENCOUNTER — Other Ambulatory Visit: Payer: Self-pay | Admitting: Pharmacist

## 2018-07-21 NOTE — Patient Outreach (Signed)
[facility]) Care Management  07/21/2018  [name] [name] [birth year] [id number]  Unsuccessful call to patient's spouse, [name], today regarding medication assistance.  I left a HIPAA compliant voicemail on home and mobile phone numbers.   Plan: I will follow-up again later this week with patient's spouse unless I hear back beforehand.   Ralene Bathe, PharmD, [place] [phone]

## 2018-07-22 ENCOUNTER — Ambulatory Visit: Payer: Self-pay

## 2018-07-22 ENCOUNTER — Other Ambulatory Visit: Payer: Self-pay

## 2018-07-22 NOTE — Patient Outreach (Signed)
[place] [facility]) Care Management  07/22/2018  [name] [name] 06-Mar-1933 [id number]   BSW attempted to contact the patient's wife, [name] [name], in regards to recent social work referral to assist with community resources. Second unsuccessful outreach attempt. BSW left a HIPAA compliant voice message requesting a return call. BSW to complete a third and final outreach attempt in the next four business days.   Daneen Schick, BSW, CDP [facility] [phone]

## 2018-07-24 ENCOUNTER — Other Ambulatory Visit: Payer: Self-pay | Admitting: Pharmacist

## 2018-07-24 NOTE — Patient Outreach (Signed)
[place] [facility]) Care Management  07/24/2018  Jordanny Waddington [birth year] [id number]   Unsuccessful call attempt to Mr. Ismar Yabut or spouse. I left a HIPAA compliant voicemail on the work number. No voicemail available for home or mobile.  4 unsuccessful outreach letters have been mailed to patient in the last 2 weeks.    Plan: I will follow up next week.  If I have not heard back from patient by Wednesday, Aug 7th, I will close case.    Ralene Bathe, PharmD, [place] [phone]

## 2018-07-28 ENCOUNTER — Ambulatory Visit: Payer: Self-pay

## 2018-07-28 ENCOUNTER — Other Ambulatory Visit: Payer: Self-pay

## 2018-07-28 NOTE — Patient Outreach (Signed)
[place] [facility]) Care Management  07/28/2018  [name] [name] 05-Nov-1933 [id number]   Third unsuccessful outreach attempt made by this BSW. It is noted that CSW, Nat Christen also attempted to outreach the patient on 7/19. Unfortunately, this outreach attempt was also unsuccessful and an unsuccessful outreach letter was mailed to the patients home. BSW to perform a discipline closure on today's date considering the [place] Work team has had four unsuccessful outreach calls to the patients home with no return call and it has been over 10 days since an unsuccessful outreach letter was mailed to the patients home.   BSW to notify other members of the patients care team of today's discipline closure.  Daneen Schick, BSW, CDP [facility] [phone]

## 2018-07-29 ENCOUNTER — Other Ambulatory Visit: Payer: Self-pay | Admitting: *Deleted

## 2018-07-29 NOTE — Patient Outreach (Signed)
[facility]) Care Management  07/29/2018  [name] [name] 01-13-1933 [id number]   RN Health Coach attempted #1 follow up outreach call to patient.  Patient was unavailable. Home phone no answer. Telephone call to mobile number.  HIPPA compliance voicemail message left with return callback number.  Plan: RN will call patient again within 10 business days.  [place] Care Management [phone]

## 2018-07-30 ENCOUNTER — Other Ambulatory Visit: Payer: Self-pay | Admitting: Pharmacist

## 2018-07-30 NOTE — Patient Outreach (Signed)
[facility]) Care Management  07/30/2018  [name] [name] 07/31/1933 [id number]    84 year old male referred for [facility] services by HTA concierge.  [facility] pharmacy referral placed for medication assistance.  4 call attempts and an outreach letter have been sent to patient without response.    Plan: I will close patient case at this time as I have not received any return call from patient.  I am happy to assist in the future as needed.   Ralene Bathe, PharmD, [place] [phone]

## 2018-08-04 ENCOUNTER — Other Ambulatory Visit: Payer: Self-pay | Admitting: *Deleted

## 2018-08-04 NOTE — Patient Outreach (Signed)
[place] [facility]) Care Management  08/04/2018  Eryk Beavers 1932-12-26 [id number]   RN Health Coach attempted #2 follow up outreach call to patient.  Patient was unavailable. HIPPA compliance voicemail message left with return callback number.  Plan: RN will call patient again within 10 business days. Unsuccessful outreach letter sent  [place] Management [phone]

## 2018-08-15 ENCOUNTER — Other Ambulatory Visit: Payer: Self-pay | Admitting: *Deleted

## 2018-08-15 NOTE — Patient Outreach (Signed)
[place] [facility]) Care Management  08/15/2018  [name] [name] [birth year] [id number]   RN Health Coach telephone call to patient wife.  Hipaa compliance verified. Per wife she is getting ready to go to for her appointment and is unable to talk with Health Coach now.  Plan: Patient wife took number and stated will call [place] the 1st of the week.   [place] Care Management [phone]

## 2018-09-01 ENCOUNTER — Other Ambulatory Visit: Payer: Self-pay | Admitting: *Deleted

## 2018-09-01 NOTE — Patient Outreach (Signed)
[place] [facility] Care Management  09/01/2018  Geovany Trudo 1933-03-13 [id number]   RN Health Coach attempted follow up outreach call to patient.  Per wife she is waiting on the plumber and this is not a good time. Per patient wife she will call me back.  Plan: RN will call patient again within 30 business  days.  [place] Care Management [phone]

## 2018-10-01 ENCOUNTER — Ambulatory Visit: Payer: Self-pay | Admitting: *Deleted

## 2018-10-10 ENCOUNTER — Other Ambulatory Visit: Payer: Self-pay | Admitting: *Deleted

## 2019-02-09 NOTE — Patient Outreach (Signed)
[place]) Care Management  [id number] Late entry   [name] [name] Feb 18, 1933 [id number]  RN Health Coach attempted follow up outreach call to patient.  Patient was unavailable. HIPPA compliance voicemail message left with return callback number.  Plan: RN will call patient again within 30 days.  [place] Care Management [phone]

## 2019-02-12 ENCOUNTER — Other Ambulatory Visit: Payer: Self-pay | Admitting: *Deleted

## 2019-02-12 NOTE — Patient Outreach (Signed)
[place] [facility]) Care Management  02/12/2019  [name] [name] [birth year] [id number]  RN Health Coach telephone call to patient.  Hipaa compliance verified. Per wife this is not a good time to talk. Per wife she has an appointment . Patient wife stated she will RN Health Coach back today.   [place] Care Management [phone]

## 2019-02-23 ENCOUNTER — Other Ambulatory Visit: Payer: Self-pay | Admitting: *Deleted

## 2019-02-23 NOTE — Patient Outreach (Signed)
[place] [facility]) Care Management  02/23/2019  Audi Wettstein 07/05/1933 [id number]   RN Health Coach telephone received return call from patient wife.  Hipaa compliance verified. Per wife the patient diabetes is doing ok. The wife stated she needs help from the social worker, She said it is hard to get the patient in the bath tub for a bath. She said when she got him in the last time she could not get him out and had to call 911. She would like to get bars placed in the tub.  She said it is also hard on the limited income getting healthy food. Per wife she would like to get a list of food banks.  Per wife the patient doesn't get out much and she would like to be able to take him to some social places like a center to get some exercise and she go with him.  Patient wife does not feel she needs any assistance with pharmacy because he goes to the [place]. She also doesn't feel she needs any additional diabetes education. RN did discuss hyper and hypoglycemia. Plan  Refer to social worker RN will send picture sheet on hyperglycemia RN sent copy of [employer] senior center  [place] Management [phone]

## 2019-02-26 ENCOUNTER — Other Ambulatory Visit: Payer: Self-pay | Admitting: *Deleted

## 2019-02-27 NOTE — Patient Outreach (Signed)
[place] [facility]) Care Management  02/27/2019  Flemon Kelty 12/14/1933 [id number]   CSW made contact with pt's wife by phone on 02/26/2019. Pt has dementia and per wife she is HCPOA. Pt's identity was confirmed by wife and CSW introduced self and reason for call. Per wife, she is struggling to be pt's caregiver in the home. Their household income is too high for Medicaid but pt is seen at the [facility] clinic and CSW has encouraged her to inquire with them about his benefits which may offer and assist with private duty care at home.  CSW has offered to mail pt some resources that may be helpful and plan a follow up call with her in the next 2 weeks.    [name], MSW, [place] Worker  [place] [phone]

## 2019-03-10 ENCOUNTER — Ambulatory Visit: Payer: Self-pay | Admitting: *Deleted

## 2019-03-11 ENCOUNTER — Other Ambulatory Visit: Payer: Self-pay | Admitting: *Deleted

## 2019-03-11 NOTE — Patient Outreach (Addendum)
[name]) Care Management  03/11/2019  [name] [name] September 15, 1933 [id number]    CSW attempted to reach pt/family by phone on yesterday and left voice message. CSW will attempt again in the next 3-4 business days if no return call is received.   [name], MSW, [place] Worker  [place] [phone]

## 2019-03-16 ENCOUNTER — Other Ambulatory Visit: Payer: Self-pay | Admitting: *Deleted

## 2019-03-16 ENCOUNTER — Ambulatory Visit: Payer: Self-pay | Admitting: *Deleted

## 2019-03-16 NOTE — Patient Outreach (Signed)
[place] [facility]) Care Management  03/16/2019  Cam Dauphin [birth year] [id number]    CSW was unable to reach pt by phone today and left a HIPPA compliant voice message. CSW will send pt an unsuccessful outreach letter and try again if no return call is received in the next  3-5 business days.    [name], MSW, [place] Worker  [place] [phone]

## 2019-03-24 ENCOUNTER — Other Ambulatory Visit: Payer: Self-pay | Admitting: *Deleted

## 2019-03-24 NOTE — Patient Outreach (Signed)
[place] [facility]) Care Management  03/24/2019  Jeanne Terrance 1933/10/30 [id number]   CSW spoke with pt's wife who reports they are staying in due to COVID restrictions. She received the community resource material mailed to them and is also interested in food assistance/support resources.   CSW will mail pt food resource info and plan a callback in 1-2 weeks.   Pt's wife is hesitant to pursue any in home care support or external services until the COVID virus has resolved.    [name], MSW, [place] Worker  [place] [phone]

## 2019-04-02 ENCOUNTER — Other Ambulatory Visit: Payer: Self-pay | Admitting: *Deleted

## 2019-04-02 NOTE — Patient Outreach (Signed)
[place] [facility]) Care Management  04/02/2019  [name] [name] 15-Feb-1933 [id number]   CSW made contact with pt's wife who reports no concerns at this time. She has received the resource info mailed and denies any questions or further needs at this time. CSW did provide her with info and contact # for [employer] volunteers who are assisting with grocery and pharmacy shopping for seniors and encouraged her to seek their services/support if desired.    CSW will plan to outreach pt/wife next month.   [name], MSW, [place] Worker  [place] [phone]

## 2019-04-03 ENCOUNTER — Ambulatory Visit: Payer: Self-pay | Admitting: *Deleted

## 2019-04-28 ENCOUNTER — Other Ambulatory Visit: Payer: Self-pay | Admitting: *Deleted

## 2019-04-28 ENCOUNTER — Ambulatory Visit: Payer: Self-pay | Admitting: *Deleted

## 2019-04-28 NOTE — Patient Outreach (Signed)
[place] [facility]) Care Management  04/28/2019  [name] [name] 01-06-1933 [id number]   CSW attempted to reach pt today by phone and was unsuccessful. CSW will try again in 3-4 business days.   [name], MSW, [place] Worker  [place] [phone]

## 2019-04-30 ENCOUNTER — Other Ambulatory Visit: Payer: Self-pay | Admitting: *Deleted

## 2019-04-30 NOTE — Patient Outreach (Signed)
[place] [facility]) Care Management  04/30/2019  [name] 1933/03/09 [id number]   CSW spoke with pt's wife by phone who reports no concerns. She is waiting for the COVID restrictions to be lifted before she reaches out to the community resources provided as well as before arranging for in home private pay care. She denies any needs or concerns at this time and CSW has advised her of plans to close referral.  CSW will advise PCP and [facility] team of above.     [name], MSW, [place] Worker  [place] [phone]

## 2020-08-31 ENCOUNTER — Emergency Department (HOSPITAL_COMMUNITY)
Admission: EM | Admit: 2020-08-31 | Discharge: 2020-08-31 | Disposition: A | Discharge status: Home / Self Care | Payer: No Typology Code available for payment source | Attending: Emergency Medicine | Admitting: Emergency Medicine

## 2020-08-31 DIAGNOSIS — I129 Hypertensive chronic kidney disease with stage 1 through stage 4 chronic kidney disease, or unspecified chronic kidney disease: Secondary | ICD-10-CM | POA: Insufficient documentation

## 2020-08-31 DIAGNOSIS — R001 Bradycardia, unspecified: Secondary | ICD-10-CM | POA: Diagnosis not present

## 2020-08-31 DIAGNOSIS — N184 Chronic kidney disease, stage 4 (severe): Secondary | ICD-10-CM | POA: Insufficient documentation

## 2020-08-31 DIAGNOSIS — Z7982 Long term (current) use of aspirin: Secondary | ICD-10-CM | POA: Diagnosis not present

## 2020-08-31 DIAGNOSIS — F0390 Unspecified dementia without behavioral disturbance: Secondary | ICD-10-CM | POA: Diagnosis not present

## 2020-08-31 DIAGNOSIS — R55 Syncope and collapse: Secondary | ICD-10-CM

## 2020-08-31 DIAGNOSIS — Z79899 Other long term (current) drug therapy: Secondary | ICD-10-CM | POA: Diagnosis not present

## 2020-08-31 DIAGNOSIS — Z8546 Personal history of malignant neoplasm of prostate: Secondary | ICD-10-CM | POA: Insufficient documentation

## 2020-08-31 DIAGNOSIS — Z87891 Personal history of nicotine dependence: Secondary | ICD-10-CM | POA: Insufficient documentation

## 2020-08-31 LAB — CBC WITH DIFFERENTIAL/PLATELET
Abs Immature Granulocytes: 0.05 10*3/uL (ref 0.00–0.07)
Basophils Absolute: 0.1 10*3/uL (ref 0.0–0.1)
Basophils Relative: 1 %
Eosinophils Absolute: 0.1 10*3/uL (ref 0.0–0.5)
Eosinophils Relative: 1 %
HCT: 38.6 % — ABNORMAL LOW (ref 39.0–52.0)
Hemoglobin: 11.3 g/dL — ABNORMAL LOW (ref 13.0–17.0)
Immature Granulocytes: 1 %
Lymphocytes Relative: 16 %
Lymphs Abs: 1.4 10*3/uL (ref 0.7–4.0)
MCH: 22.2 pg — ABNORMAL LOW (ref 26.0–34.0)
MCHC: 29.3 g/dL — ABNORMAL LOW (ref 30.0–36.0)
MCV: 75.7 fL — ABNORMAL LOW (ref 80.0–100.0)
Monocytes Absolute: 0.8 10*3/uL (ref 0.1–1.0)
Monocytes Relative: 8 %
Neutro Abs: 6.6 10*3/uL (ref 1.7–7.7)
Neutrophils Relative %: 73 %
Platelets: 177 10*3/uL (ref 150–400)
RBC: 5.1 MIL/uL (ref 4.22–5.81)
RDW: 14.2 % (ref 11.5–15.5)
WBC: 8.9 10*3/uL (ref 4.0–10.5)
nRBC: 0 % (ref 0.0–0.2)

## 2020-08-31 LAB — URINALYSIS, ROUTINE W REFLEX MICROSCOPIC
Bilirubin Urine: NEGATIVE
Glucose, UA: NEGATIVE mg/dL
Hgb urine dipstick: NEGATIVE
Ketones, ur: NEGATIVE mg/dL
Leukocytes,Ua: NEGATIVE
Nitrite: NEGATIVE
Protein, ur: NEGATIVE mg/dL
Specific Gravity, Urine: 1.01 (ref 1.005–1.030)
pH: 7 (ref 5.0–8.0)

## 2020-08-31 LAB — COMPREHENSIVE METABOLIC PANEL
ALT: 11 U/L (ref 0–44)
AST: 10 U/L — ABNORMAL LOW (ref 15–41)
Albumin: 3.2 g/dL — ABNORMAL LOW (ref 3.5–5.0)
Alkaline Phosphatase: 84 U/L (ref 38–126)
Anion gap: 7 (ref 5–15)
BUN: 23 mg/dL (ref 8–23)
CO2: 26 mmol/L (ref 22–32)
Calcium: 8.6 mg/dL — ABNORMAL LOW (ref 8.9–10.3)
Chloride: 107 mmol/L (ref 98–111)
Creatinine, Ser: 2.14 mg/dL — ABNORMAL HIGH (ref 0.61–1.24)
GFR calc Af Amer: 32 mL/min — ABNORMAL LOW (ref >60)
GFR calc non Af Amer: 27 mL/min — ABNORMAL LOW (ref >60)
Glucose, Bld: 103 mg/dL — ABNORMAL HIGH (ref 70–99)
Potassium: 4.1 mmol/L (ref 3.5–5.1)
Sodium: 140 mmol/L (ref 135–145)
Total Bilirubin: 0.3 mg/dL (ref 0.3–1.2)
Total Protein: 6.4 g/dL — ABNORMAL LOW (ref 6.5–8.1)

## 2020-08-31 LAB — TROPONIN I (HIGH SENSITIVITY)
Troponin I (High Sensitivity): 4 ng/L (ref <18)
Troponin I (High Sensitivity): 4 ng/L (ref <18)

## 2020-08-31 MED ORDER — SODIUM CHLORIDE 0.9 % IV BOLUS
500.0000 mL | Freq: Once | INTRAVENOUS | Status: AC
  Administered 2020-08-31: 500 mL via INTRAVENOUS

## 2020-08-31 NOTE — ED Provider Notes (Signed)
[place] EMERGENCY DEPARTMENT Provider Note   CSN: [id number] Arrival date & time: 08/31/20  1853     History Chief Complaint  Patient presents with  . Hypotension  . Bradycardia    [name] [name] is a 86 y.o. male.  HPI Level 5 caveat due to dementia. Patient presents with syncopal/near syncopal episode.  Reportedly was walking to the bathroom after sitting up to go to the bathroom.  EMS arrived and found hypotensive with blood pressure in the 70s and heart rate around 46.  Reportedly occasionally dipped more also.  Given fluid bolus with mental status improvement.  Initially was nearly unresponsive.  History of diabetes dementia.  Has had similar episodes in the past when he has been dehydrated.  Patient is now near his baseline.    Past Medical History:  Diagnosis Date  . Cancer [facility])    prostate  . Gallstones   . Hypertension   . Stone, kidney   . Stone, kidney     Patient Active Problem List   Diagnosis Date Noted  . Acute kidney injury superimposed on chronic kidney disease ([place]) 03/26/2016  . Syncope and collapse 11/06/2015  . Hypertension 11/06/2015  . Carotid sinus syncope or syndrome 11/06/2015  . Acute renal failure syndrome ([place])   . CKD (chronic kidney disease) stage 4, GFR 15-29 ml/min (HCC) 03/27/2015  . Anemia of chronic disease 03/27/2015  . Syncope 12/02/2011  . HTN (hypertension) 12/02/2011    Past Surgical History:  Procedure Laterality Date  . PROSTATE SURGERY         Family History  Problem Relation Age of Onset  . Lung cancer Sister     Social History   Tobacco Use  . Smoking status: Former Smoker    Packs/day: 0.50    Years: 20.00    Pack years: 10.00    Quit date: 12/02/1972    Years since quitting: 47.7  . Smokeless tobacco: Never Used  Substance Use Topics  . Alcohol use: No  . Drug use: No    Home Medications Prior to Admission medications   Medication Sig Start Date End Date Taking? Authorizing  Provider  acetaminophen (TYLENOL) 500 MG tablet Take 1,000 mg by mouth daily as needed for mild pain.    Yes [provider]  Alogliptin Benzoate 12.5 MG TABS Take 6.25 mg by mouth daily.   Yes [provider]  amLODipine (NORVASC) 10 MG tablet Take 10 mg by mouth daily.     Yes [provider]  aspirin EC 81 MG tablet Take 81 mg by mouth daily.   Yes [provider]  Cholecalciferol (VITAMIN D3) 2000 UNITS TABS Take 2,000 Units by mouth daily.   Yes [provider]  Cyanocobalamin (VITAMIN B 12 PO) Take 500 mcg by mouth daily.    Yes [provider]  diclofenac Sodium (VOLTAREN) 1 % GEL Apply 4 g topically 4 (four) times daily as needed (pain).   Yes [provider]  donepezil (ARICEPT) 10 MG tablet Take 10 mg by mouth every morning.    Yes [provider]  ferrous sulfate 325 (65 FE) MG tablet Take 325 mg by mouth every Monday, Wednesday, and Friday.   Yes [provider]  furosemide (LASIX) 20 MG tablet Take 20 mg by mouth daily as needed for fluid.   Yes [provider]  glipiZIDE (GLUCOTROL) 5 MG tablet Take 5 mg by mouth 2 (two) times daily before a meal.   Yes  [provider]  LORazepam (ATIVAN) 0.5 MG tablet Take 0.5 mg by mouth every 8 (eight) hours as needed for anxiety.    Yes [provider]  mirabegron ER (MYRBETRIQ) 50 MG TB24 tablet Take 50 mg by mouth daily.   Yes [provider]  Multiple Vitamins-Minerals (MULTIVITAMIN ADULT) CHEW Chew 1 tablet by mouth daily.   Yes [provider]  QUEtiapine (SEROQUEL) 25 MG tablet Take 50 mg by mouth 2 (two) times daily.   Yes [provider]  amoxicillin-clavulanate (AUGMENTIN) 875-125 MG tablet Take 1 tablet by mouth every 12 (twelve) hours. Patient not taking: Reported on 08/31/2020 11/24/17   Davonna Belling, MD    Allergies    Patient has no known allergies.  Review of Systems   Review of Systems   Unable to perform ROS: Dementia    Physical Exam Updated Vital Signs BP (!) 153/93   Pulse 69   Temp 98.3 F (36.8 C)   Resp 17   SpO2 98%   Physical Exam Vitals and nursing note reviewed.  HENT:     Head: Normocephalic.  Eyes:     Pupils: Pupils are equal, round, and reactive to light.  Cardiovascular:     Rate and Rhythm: Regular rhythm. Bradycardia present.  Pulmonary:     Breath sounds: No rhonchi.  Abdominal:     Tenderness: There is no abdominal tenderness.  Skin:    General: Skin is warm.  Neurological:     Mental Status: He is alert.     Comments: Patient is awake and pleasant and near his baseline.     ED Results / Procedures / Treatments   Labs (all labs ordered are listed, but only abnormal results are displayed) Labs Reviewed  CBC WITH DIFFERENTIAL/PLATELET - Abnormal; Notable for the following components:      Result Value   Hemoglobin 11.3 (*)    HCT 38.6 (*)    MCV 75.7 (*)    MCH 22.2 (*)    MCHC 29.3 (*)    All other components within normal limits  URINALYSIS, ROUTINE W REFLEX MICROSCOPIC - Abnormal; Notable for the following components:   Color, Urine STRAW (*)    All other components within normal limits  COMPREHENSIVE METABOLIC PANEL - Abnormal; Notable for the following components:   Glucose, Bld 103 (*)    Creatinine, Ser 2.14 (*)    Calcium 8.6 (*)    Total Protein 6.4 (*)    Albumin 3.2 (*)    AST 10 (*)    GFR calc non Af Amer 27 (*)    GFR calc Af Amer 32 (*)    All other components within normal limits  TROPONIN I (HIGH SENSITIVITY)  TROPONIN I (HIGH SENSITIVITY)    EKG EKG Interpretation  Date/Time:  Wednesday August 31 2020 19:29:14 EDT Ventricular Rate:  51 PR Interval:    QRS Duration: 108 QT Interval:  473 QTC Calculation: 436 R Axis:   37 Text Interpretation: Sinus rhythm Short PR interval Nonspecific T abnormalities, lateral leads Baseline wander in lead(s) V6 Confirmed by Davonna Belling [phone]) on  08/31/2020 7:30:49 PM   Radiology No results found.  Procedures Procedures (including critical care time)  Medications Ordered in ED Medications  sodium chloride 0.9 % bolus 500 mL (0 mLs Intravenous Stopped 08/31/20 2210)    ED Course  I have reviewed the triage vital signs and the nursing notes.  Pertinent labs & imaging results that were available during my care of  the patient were reviewed by me and considered in my medical decision making (see chart for details).    MDM Rules/Calculators/A&P                          Patient with syncope/near syncope.  History of same.  Had multiple episodes around 4 years ago.  Presents with his wife.  Had been getting up to go to the bathroom and had episode of hypotension mental status change.  Was a report of either a heart block or potentially bradycardia but the strips with EMS did not show either of this.  Creatinine mildly elevated but appears to be near his baseline.  Feels better after some IV fluids.  No infection.  Discussed with patient and wife.  We think it is stable for discharge home.  Potentially was an arrhythmia but this is felt less likely.  Likely vagal event due to having to go to the bathroom.  Will discharge home to follow-up as an outpatient.  Discussed possibility of admission but we think patient would do better as an outpatient. Final Clinical Impression(s) / ED Diagnoses Final diagnoses:  Syncope, unspecified syncope type    Rx / DC Orders ED Discharge Orders    None       Davonna Belling, MD 09/01/20 [phone]

## 2020-08-31 NOTE — ED Triage Notes (Signed)
Pt bib ems from home d/t a  Near syncopal episode while ambulating to the RR. Family was able to lower pt to the floor. EMS found pt nearly unresponsive and pale. Sys bp in 70's, hr 46, with a few quick dips into the 20's. Pt given 500 NS and began to follow commands. Pt A&O x2, cbg 93. Pt hx dementia and DM. Last bp 118/78 rr14 98% RA EKG revealed possible heart block and/or bradycardia.

## 2020-08-31 NOTE — Discharge Instructions (Signed)
Try to keep yourself hydrated.  Follow-up with your doctor as needed.

## 2020-11-28 ENCOUNTER — Other Ambulatory Visit: Payer: Self-pay

## 2020-11-28 ENCOUNTER — Encounter (HOSPITAL_COMMUNITY): Payer: Self-pay

## 2020-11-28 ENCOUNTER — Ambulatory Visit (HOSPITAL_COMMUNITY)
Admission: EM | Admit: 2020-11-28 | Discharge: 2020-11-28 | Disposition: A | Discharge status: Home / Self Care | Payer: Medicare PPO | Attending: Family Medicine | Admitting: Family Medicine

## 2020-11-28 DIAGNOSIS — W19XXXA Unspecified fall, initial encounter: Secondary | ICD-10-CM | POA: Diagnosis not present

## 2020-11-28 DIAGNOSIS — R55 Syncope and collapse: Secondary | ICD-10-CM | POA: Diagnosis not present

## 2020-11-28 DIAGNOSIS — Y92009 Unspecified place in unspecified non-institutional (private) residence as the place of occurrence of the external cause: Secondary | ICD-10-CM

## 2020-11-28 NOTE — Discharge Instructions (Addendum)
Please check with your doctor to see if perhaps cutting back on his medicine will help his blood pressure be more stable.

## 2020-11-28 NOTE — ED Provider Notes (Signed)
[place]    CSN: [id number] Arrival date & time: 11/28/20  1454      History   Chief Complaint Chief Complaint  Patient presents with  . Fall    HPI [name] [name] is a 86 y.o. male.   This is the initial [name] [name] urgent care visit for this 86 year old man who had a syncopal episode this morning.  Pt presents for fall. Patient was eating breakfast and fell of of the stool and hit his head. Unwitnessed fall. Wife states when she heard the fall she ran to him and he was lying on the floor with his eyes closed. She called his name and he opened his eyes. They had difficulty getting him up so they called EMS. Pt unsure what made him fall. Pt is at his baseline per wife. Blood sugar was 238 with EMS   Patient felt like 1 month ago when he was not observed.  He is on quite a bit of medicine but is acting normally for him now.     Past Medical History:  Diagnosis Date  . Cancer [facility])    prostate  . Gallstones   . Hypertension   . Stone, kidney   . Stone, kidney     Patient Active Problem List   Diagnosis Date Noted  . Acute kidney injury superimposed on chronic kidney disease ([place]) 03/26/2016  . Syncope and collapse 11/06/2015  . Hypertension 11/06/2015  . Carotid sinus syncope or syndrome 11/06/2015  . Acute renal failure syndrome ([place])   . CKD (chronic kidney disease) stage 4, GFR 15-29 ml/min (HCC) 03/27/2015  . Anemia of chronic disease 03/27/2015  . Syncope 12/02/2011  . HTN (hypertension) 12/02/2011    Past Surgical History:  Procedure Laterality Date  . PROSTATE SURGERY         Home Medications    Prior to Admission medications   Medication Sig Start Date End Date Taking? Authorizing Provider  acetaminophen (TYLENOL) 500 MG tablet Take 1,000 mg by mouth daily as needed for mild pain.     [provider]  Alogliptin Benzoate 12.5 MG TABS Take 6.25 mg by mouth daily.    [provider]  amLODipine (NORVASC) 10 MG tablet  Take 10 mg by mouth daily.      [provider]  aspirin EC 81 MG tablet Take 81 mg by mouth daily.    [provider]  Cholecalciferol (VITAMIN D3) 2000 UNITS TABS Take 2,000 Units by mouth daily.    [provider]  Cyanocobalamin (VITAMIN B 12 PO) Take 500 mcg by mouth daily.     [provider]  diclofenac Sodium (VOLTAREN) 1 % GEL Apply 4 g topically 4 (four) times daily as needed (pain).    [provider]  donepezil (ARICEPT) 10 MG tablet Take 10 mg by mouth every morning.     [provider]  ferrous sulfate 325 (65 FE) MG tablet Take 325 mg by mouth every Monday, Wednesday, and Friday.    [provider]  furosemide (LASIX) 20 MG tablet Take 20 mg by mouth daily as needed for fluid.    [provider]  glipiZIDE (GLUCOTROL) 5 MG tablet Take 5 mg by mouth 2 (two) times daily before a meal.    [provider]  LORazepam (ATIVAN) 0.5 MG tablet Take 0.5 mg by mouth every 8 (eight) hours as needed for anxiety.     [provider]  mirabegron ER (MYRBETRIQ) 50 MG TB24  tablet Take 50 mg by mouth daily.    [provider]  Multiple Vitamins-Minerals (MULTIVITAMIN ADULT) CHEW Chew 1 tablet by mouth daily.    [provider]  QUEtiapine (SEROQUEL) 25 MG tablet Take 50 mg by mouth 2 (two) times daily.    [provider]    Family History Family History  Problem Relation Age of Onset  . Lung cancer Sister     Social History Social History   Tobacco Use  . Smoking status: Former Smoker    Packs/day: 0.50    Years: 20.00    Pack years: 10.00    Quit date: 12/02/1972    Years since quitting: 48.0  . Smokeless tobacco: Never Used  Substance Use Topics  . Alcohol use: No  . Drug use: No     Allergies   Patient has no known allergies.   Review of Systems Review of Systems  Constitutional: Negative.   Respiratory: Negative.   Musculoskeletal: Negative for neck pain.   Neurological: Positive for syncope.     Physical Exam Triage Vital Signs ED Triage Vitals  Enc Vitals Group     BP 11/28/20 1506 (!) 147/73     Pulse Rate 11/28/20 1503 65     Resp 11/28/20 1503 19     Temp 11/28/20 1503 98 F (36.7 C)     Temp src --      SpO2 11/28/20 1503 97 %     Weight --      Height --      Head Circumference --      Peak Flow --      Pain Score 11/28/20 1502 0     Pain Loc --      Pain Edu? --      Excl. in [place]? --    No data found.  Updated Vital Signs BP (!) 147/73   Pulse 65   Temp 98 F (36.7 C)   Resp 19   SpO2 97%    Physical Exam Vitals and nursing note reviewed.  Constitutional:      General: He is not in acute distress.    Appearance: Normal appearance. He is normal weight.     Comments: Patient is examined with his wife at his side who is giving the history and helping instruct patient during the exam  HENT:     Nose: Nose normal.     Mouth/Throat:     Comments: I cannot get the patient to open his mouth or stick his tongue out Eyes:     Conjunctiva/sclera: Conjunctivae normal.  Cardiovascular:     Rate and Rhythm: Normal rate.     Heart sounds: Murmur heard.      Comments: Soft systolic murmur best heard at the right sternal border Pulmonary:     Effort: Pulmonary effort is normal.     Breath sounds: Normal breath sounds.  Musculoskeletal:     Comments: Patient cannot follow commands well so I cannot evaluate range of motion of his arms and legs but he does hold his arms out straight and is able to stand and lift 1 leg at a time.  Skin:    General: Skin is warm and dry.  Neurological:     Mental Status: He is alert. Mental status is at baseline. He is disoriented.     Cranial Nerves: No cranial nerve deficit.     Coordination: Coordination abnormal.     Gait: Gait abnormal.     Comments:  Patient does not follow commands.  He is able to stand up and hold his arms out in front of himself.  His eye movements seems to  be normal.  His neck is supple and there is no sign of trauma on his head or neck.  His chest seems nontender.      UC Treatments / Results  Labs (all labs ordered are listed, but only abnormal results are displayed) Labs Reviewed - No data to display  EKG   Radiology No results found.  Procedures Procedures (including critical care time)  Medications Ordered in UC Medications - No data to display  Initial Impression / Assessment and Plan / UC Course  I have reviewed the triage vital signs and the nursing notes.  Pertinent labs & imaging results that were available during my care of the patient were reviewed by me and considered in my medical decision making (see chart for details).    Final Clinical Impressions(s) / UC Diagnoses   Final diagnoses:  Fall in home, initial encounter     Discharge Instructions     Please check with your doctor to see if perhaps cutting back on his medicine will help his blood pressure be more stable.    ED Prescriptions    None     I have reviewed the PDMP during this encounter.   Robyn Haber, MD 11/28/20 1625

## 2020-11-28 NOTE — ED Triage Notes (Addendum)
Pt presents for fall. Patient was eating breakfast and fell of of the stool and hit his head. Unwitnessed fall. Wife states when she heard the fall she ran to him and he was lying on the floor with his eyes closed. She called his name and he opened his eyes. They had difficulty getting him up so they called EMS. Pt unsure what made him fall. Pt is at his baseline per wife. Blood sugar was 238 with EMS

## 2020-12-26 ENCOUNTER — Other Ambulatory Visit: Payer: Self-pay

## 2020-12-26 ENCOUNTER — Encounter (HOSPITAL_BASED_OUTPATIENT_CLINIC_OR_DEPARTMENT_OTHER): Payer: Self-pay

## 2020-12-26 DIAGNOSIS — Z8546 Personal history of malignant neoplasm of prostate: Secondary | ICD-10-CM | POA: Insufficient documentation

## 2020-12-26 DIAGNOSIS — Z7982 Long term (current) use of aspirin: Secondary | ICD-10-CM | POA: Insufficient documentation

## 2020-12-26 DIAGNOSIS — N184 Chronic kidney disease, stage 4 (severe): Secondary | ICD-10-CM | POA: Insufficient documentation

## 2020-12-26 DIAGNOSIS — M7989 Other specified soft tissue disorders: Secondary | ICD-10-CM | POA: Insufficient documentation

## 2020-12-26 DIAGNOSIS — Z7984 Long term (current) use of oral hypoglycemic drugs: Secondary | ICD-10-CM | POA: Insufficient documentation

## 2020-12-26 DIAGNOSIS — I82409 Acute embolism and thrombosis of unspecified deep veins of unspecified lower extremity: Secondary | ICD-10-CM | POA: Diagnosis not present

## 2020-12-26 DIAGNOSIS — Z79899 Other long term (current) drug therapy: Secondary | ICD-10-CM | POA: Insufficient documentation

## 2020-12-26 DIAGNOSIS — I129 Hypertensive chronic kidney disease with stage 1 through stage 4 chronic kidney disease, or unspecified chronic kidney disease: Secondary | ICD-10-CM | POA: Insufficient documentation

## 2020-12-26 DIAGNOSIS — I82422 Acute embolism and thrombosis of left iliac vein: Secondary | ICD-10-CM | POA: Diagnosis not present

## 2020-12-26 DIAGNOSIS — Z87891 Personal history of nicotine dependence: Secondary | ICD-10-CM | POA: Insufficient documentation

## 2020-12-26 NOTE — ED Triage Notes (Signed)
Per pt and wife pt with swelling to left LE started yesterday-denies injury-NAD-to triage in w/c

## 2020-12-26 NOTE — ED Notes (Signed)
[place] not available-discussed case with EDP [place]-no orders at this time

## 2020-12-27 ENCOUNTER — Inpatient Hospital Stay (HOSPITAL_COMMUNITY): Payer: No Typology Code available for payment source

## 2020-12-27 ENCOUNTER — Emergency Department (HOSPITAL_BASED_OUTPATIENT_CLINIC_OR_DEPARTMENT_OTHER)
Admission: EM | Admit: 2020-12-27 | Discharge: 2020-12-27 | Disposition: A | Discharge status: Home / Self Care | Payer: No Typology Code available for payment source | Source: Home / Self Care | Attending: Emergency Medicine | Admitting: Emergency Medicine

## 2020-12-27 ENCOUNTER — Inpatient Hospital Stay (HOSPITAL_COMMUNITY)
Admission: EM | Admit: 2020-12-27 | Discharge: 2021-01-03 | DRG: 300 | Disposition: A | Discharge status: Skilled Nursing Facility | Payer: No Typology Code available for payment source | Attending: Internal Medicine | Admitting: Internal Medicine

## 2020-12-27 ENCOUNTER — Other Ambulatory Visit: Payer: Self-pay

## 2020-12-27 ENCOUNTER — Encounter (HOSPITAL_COMMUNITY): Payer: Self-pay | Admitting: Emergency Medicine

## 2020-12-27 ENCOUNTER — Emergency Department (HOSPITAL_BASED_OUTPATIENT_CLINIC_OR_DEPARTMENT_OTHER)
Admission: RE | Admit: 2020-12-27 | Discharge: 2020-12-27 | Disposition: A | Discharge status: Home / Self Care | Payer: No Typology Code available for payment source | Source: Ambulatory Visit | Attending: Emergency Medicine | Admitting: Emergency Medicine

## 2020-12-27 ENCOUNTER — Emergency Department (HOSPITAL_COMMUNITY): Payer: No Typology Code available for payment source

## 2020-12-27 DIAGNOSIS — I82442 Acute embolism and thrombosis of left tibial vein: Secondary | ICD-10-CM | POA: Diagnosis present

## 2020-12-27 DIAGNOSIS — N189 Chronic kidney disease, unspecified: Secondary | ICD-10-CM | POA: Diagnosis not present

## 2020-12-27 DIAGNOSIS — R627 Adult failure to thrive: Secondary | ICD-10-CM | POA: Diagnosis not present

## 2020-12-27 DIAGNOSIS — K5900 Constipation, unspecified: Secondary | ICD-10-CM | POA: Diagnosis not present

## 2020-12-27 DIAGNOSIS — I824Y2 Acute embolism and thrombosis of unspecified deep veins of left proximal lower extremity: Secondary | ICD-10-CM | POA: Diagnosis not present

## 2020-12-27 DIAGNOSIS — Z8546 Personal history of malignant neoplasm of prostate: Secondary | ICD-10-CM

## 2020-12-27 DIAGNOSIS — F0150 Vascular dementia without behavioral disturbance: Secondary | ICD-10-CM | POA: Diagnosis not present

## 2020-12-27 DIAGNOSIS — F0390 Unspecified dementia without behavioral disturbance: Secondary | ICD-10-CM | POA: Diagnosis present

## 2020-12-27 DIAGNOSIS — R609 Edema, unspecified: Secondary | ICD-10-CM

## 2020-12-27 DIAGNOSIS — I10 Essential (primary) hypertension: Secondary | ICD-10-CM | POA: Diagnosis present

## 2020-12-27 DIAGNOSIS — D631 Anemia in chronic kidney disease: Secondary | ICD-10-CM | POA: Diagnosis present

## 2020-12-27 DIAGNOSIS — I82402 Acute embolism and thrombosis of unspecified deep veins of left lower extremity: Secondary | ICD-10-CM | POA: Diagnosis not present

## 2020-12-27 DIAGNOSIS — I82422 Acute embolism and thrombosis of left iliac vein: Principal | ICD-10-CM

## 2020-12-27 DIAGNOSIS — I82412 Acute embolism and thrombosis of left femoral vein: Secondary | ICD-10-CM | POA: Diagnosis not present

## 2020-12-27 DIAGNOSIS — I82409 Acute embolism and thrombosis of unspecified deep veins of unspecified lower extremity: Secondary | ICD-10-CM | POA: Diagnosis present

## 2020-12-27 DIAGNOSIS — R7989 Other specified abnormal findings of blood chemistry: Secondary | ICD-10-CM | POA: Diagnosis not present

## 2020-12-27 DIAGNOSIS — N179 Acute kidney failure, unspecified: Secondary | ICD-10-CM | POA: Diagnosis present

## 2020-12-27 DIAGNOSIS — I129 Hypertensive chronic kidney disease with stage 1 through stage 4 chronic kidney disease, or unspecified chronic kidney disease: Secondary | ICD-10-CM | POA: Diagnosis present

## 2020-12-27 DIAGNOSIS — N184 Chronic kidney disease, stage 4 (severe): Secondary | ICD-10-CM | POA: Diagnosis present

## 2020-12-27 DIAGNOSIS — Z7984 Long term (current) use of oral hypoglycemic drugs: Secondary | ICD-10-CM | POA: Diagnosis not present

## 2020-12-27 DIAGNOSIS — Z87891 Personal history of nicotine dependence: Secondary | ICD-10-CM | POA: Diagnosis not present

## 2020-12-27 DIAGNOSIS — Z20822 Contact with and (suspected) exposure to covid-19: Secondary | ICD-10-CM | POA: Diagnosis present

## 2020-12-27 DIAGNOSIS — Z79899 Other long term (current) drug therapy: Secondary | ICD-10-CM | POA: Diagnosis not present

## 2020-12-27 DIAGNOSIS — Z7189 Other specified counseling: Secondary | ICD-10-CM | POA: Diagnosis not present

## 2020-12-27 DIAGNOSIS — E1122 Type 2 diabetes mellitus with diabetic chronic kidney disease: Secondary | ICD-10-CM | POA: Diagnosis present

## 2020-12-27 DIAGNOSIS — Z515 Encounter for palliative care: Secondary | ICD-10-CM

## 2020-12-27 DIAGNOSIS — Z7982 Long term (current) use of aspirin: Secondary | ICD-10-CM | POA: Diagnosis not present

## 2020-12-27 DIAGNOSIS — Z23 Encounter for immunization: Secondary | ICD-10-CM

## 2020-12-27 DIAGNOSIS — M7989 Other specified soft tissue disorders: Secondary | ICD-10-CM

## 2020-12-27 LAB — CBC WITH DIFFERENTIAL/PLATELET
Abs Immature Granulocytes: 0.05 10*3/uL (ref 0.00–0.07)
Abs Immature Granulocytes: 0.05 10*3/uL (ref 0.00–0.07)
Basophils Absolute: 0.1 10*3/uL (ref 0.0–0.1)
Basophils Absolute: 0.1 10*3/uL (ref 0.0–0.1)
Basophils Relative: 1 %
Basophils Relative: 0 %
Eosinophils Absolute: 0.2 10*3/uL (ref 0.0–0.5)
Eosinophils Absolute: 0.3 10*3/uL (ref 0.0–0.5)
Eosinophils Relative: 2 %
Eosinophils Relative: 2 %
HCT: 41.4 % (ref 39.0–52.0)
HCT: 41.6 % (ref 39.0–52.0)
Hemoglobin: 12.2 g/dL — ABNORMAL LOW (ref 13.0–17.0)
Hemoglobin: 12.2 g/dL — ABNORMAL LOW (ref 13.0–17.0)
Immature Granulocytes: 0 %
Immature Granulocytes: 0 %
Lymphocytes Relative: 15 %
Lymphocytes Relative: 13 %
Lymphs Abs: 1.9 10*3/uL (ref 0.7–4.0)
Lymphs Abs: 1.5 10*3/uL (ref 0.7–4.0)
MCH: 22.2 pg — ABNORMAL LOW (ref 26.0–34.0)
MCH: 22.1 pg — ABNORMAL LOW (ref 26.0–34.0)
MCHC: 29.5 g/dL — ABNORMAL LOW (ref 30.0–36.0)
MCHC: 29.3 g/dL — ABNORMAL LOW (ref 30.0–36.0)
MCV: 75.4 fL — ABNORMAL LOW (ref 80.0–100.0)
MCV: 75.4 fL — ABNORMAL LOW (ref 80.0–100.0)
Monocytes Absolute: 0.9 10*3/uL (ref 0.1–1.0)
Monocytes Absolute: 0.9 10*3/uL (ref 0.1–1.0)
Monocytes Relative: 7 %
Monocytes Relative: 8 %
Neutro Abs: 9.5 10*3/uL — ABNORMAL HIGH (ref 1.7–7.7)
Neutro Abs: 8.4 10*3/uL — ABNORMAL HIGH (ref 1.7–7.7)
Neutrophils Relative %: 75 %
Neutrophils Relative %: 77 %
Platelets: 247 10*3/uL (ref 150–400)
Platelets: 260 10*3/uL (ref 150–400)
RBC: 5.49 MIL/uL (ref 4.22–5.81)
RBC: 5.52 MIL/uL (ref 4.22–5.81)
RDW: 14.3 % (ref 11.5–15.5)
RDW: 14.2 % (ref 11.5–15.5)
WBC: 12.6 10*3/uL — ABNORMAL HIGH (ref 4.0–10.5)
WBC: 11.1 10*3/uL — ABNORMAL HIGH (ref 4.0–10.5)
nRBC: 0 % (ref 0.0–0.2)
nRBC: 0 % (ref 0.0–0.2)

## 2020-12-27 LAB — BASIC METABOLIC PANEL
Anion gap: 13 (ref 5–15)
Anion gap: 10 (ref 5–15)
BUN: 40 mg/dL — ABNORMAL HIGH (ref 8–23)
BUN: 41 mg/dL — ABNORMAL HIGH (ref 8–23)
CO2: 26 mmol/L (ref 22–32)
CO2: 28 mmol/L (ref 22–32)
Calcium: 9.2 mg/dL (ref 8.9–10.3)
Calcium: 9.7 mg/dL (ref 8.9–10.3)
Chloride: 104 mmol/L (ref 98–111)
Chloride: 106 mmol/L (ref 98–111)
Creatinine, Ser: 3.39 mg/dL — ABNORMAL HIGH (ref 0.61–1.24)
Creatinine, Ser: 2.59 mg/dL — ABNORMAL HIGH (ref 0.61–1.24)
GFR, Estimated: 17 mL/min — ABNORMAL LOW (ref >60)
GFR, Estimated: 24 mL/min — ABNORMAL LOW (ref >60)
Glucose, Bld: 185 mg/dL — ABNORMAL HIGH (ref 70–99)
Glucose, Bld: 131 mg/dL — ABNORMAL HIGH (ref 70–99)
Potassium: 4.5 mmol/L (ref 3.5–5.1)
Potassium: 4.7 mmol/L (ref 3.5–5.1)
Sodium: 143 mmol/L (ref 135–145)
Sodium: 144 mmol/L (ref 135–145)

## 2020-12-27 LAB — HEMOGLOBIN A1C
Hgb A1c MFr Bld: 5.8 % — ABNORMAL HIGH (ref 4.8–5.6)
Mean Plasma Glucose: 119.76 mg/dL

## 2020-12-27 LAB — RESP PANEL BY RT-PCR (FLU A&B, COVID) ARPGX2
Influenza A by PCR: NEGATIVE
Influenza B by PCR: NEGATIVE
SARS Coronavirus 2 by RT PCR: NEGATIVE

## 2020-12-27 LAB — PROTIME-INR
INR: 1.1 (ref 0.8–1.2)
Prothrombin Time: 14.1 seconds (ref 11.4–15.2)

## 2020-12-27 LAB — HEPARIN LEVEL (UNFRACTIONATED): Heparin Unfractionated: 1.2 IU/mL — ABNORMAL HIGH (ref 0.30–0.70)

## 2020-12-27 LAB — D-DIMER, QUANTITATIVE: D-Dimer, Quant: >20 ug/mL-FEU — ABNORMAL HIGH (ref 0.00–0.50)

## 2020-12-27 MED ORDER — RIVAROXABAN (XARELTO) VTE STARTER PACK (15 & 20 MG): ORAL_TABLET | ORAL | 0 refills | Status: DC

## 2020-12-27 MED ORDER — HYDROCODONE-ACETAMINOPHEN 5-325 MG PO TABS: 1.0000 | ORAL_TABLET | ORAL | Status: DC | PRN

## 2020-12-27 MED ORDER — AMLODIPINE BESYLATE 10 MG PO TABS
10.0000 mg | ORAL_TABLET | Freq: Every day | ORAL | Status: DC
  Administered 2020-12-27 – 2021-01-03 (×8): 10 mg via ORAL
  Filled 2020-12-27 (×8): qty 1

## 2020-12-27 MED ORDER — HEPARIN (PORCINE) 25000 UT/250ML-% IV SOLN
1450.0000 [IU]/h | INTRAVENOUS | Status: DC
  Administered 2020-12-28 (×2): 1450 [IU]/h via INTRAVENOUS
  Filled 2020-12-27 (×2): qty 250

## 2020-12-27 MED ORDER — QUETIAPINE FUMARATE 50 MG PO TABS
50.0000 mg | ORAL_TABLET | Freq: Two times a day (BID) | ORAL | Status: DC
  Administered 2020-12-27 – 2021-01-03 (×15): 50 mg via ORAL
  Filled 2020-12-27 (×15): qty 1

## 2020-12-27 MED ORDER — SODIUM CHLORIDE 0.9 % IV BOLUS
500.0000 mL | Freq: Once | INTRAVENOUS | Status: AC
  Administered 2020-12-27: 500 mL via INTRAVENOUS

## 2020-12-27 MED ORDER — ENOXAPARIN SODIUM 120 MG/0.8ML ~~LOC~~ SOLN
110.0000 mg | Freq: Once | SUBCUTANEOUS | Status: AC
  Administered 2020-12-27: 110 mg via SUBCUTANEOUS
  Filled 2020-12-27: qty 0.8

## 2020-12-27 MED ORDER — MIRABEGRON ER 25 MG PO TB24
50.0000 mg | ORAL_TABLET | Freq: Every day | ORAL | Status: DC
  Administered 2020-12-28: 50 mg via ORAL
  Filled 2020-12-27: qty 2

## 2020-12-27 MED ORDER — DONEPEZIL HCL 10 MG PO TABS
10.0000 mg | ORAL_TABLET | Freq: Every day | ORAL | Status: DC
  Administered 2020-12-27 – 2021-01-03 (×8): 10 mg via ORAL
  Filled 2020-12-27 (×8): qty 1

## 2020-12-27 MED ORDER — ACETAMINOPHEN 325 MG PO TABS: 650.0000 mg | ORAL_TABLET | Freq: Four times a day (QID) | ORAL | Status: DC | PRN

## 2020-12-27 MED ORDER — HEPARIN (PORCINE) 25000 UT/250ML-% IV SOLN
1700.0000 [IU]/h | INTRAVENOUS | Status: DC
  Administered 2020-12-27: 1700 [IU]/h via INTRAVENOUS
  Filled 2020-12-27 (×2): qty 250

## 2020-12-27 MED ORDER — FUROSEMIDE 20 MG PO TABS: 20.0000 mg | ORAL_TABLET | Freq: Every day | ORAL | Status: DC | PRN

## 2020-12-27 MED ORDER — POLYETHYLENE GLYCOL 3350 17 G PO PACK: 17.0000 g | PACK | Freq: Every day | ORAL | Status: DC | PRN

## 2020-12-27 MED ORDER — LORAZEPAM 0.5 MG PO TABS: 0.5000 mg | ORAL_TABLET | Freq: Three times a day (TID) | ORAL | Status: DC | PRN

## 2020-12-27 MED ORDER — ACETAMINOPHEN 650 MG RE SUPP: 650.0000 mg | Freq: Four times a day (QID) | RECTAL | Status: DC | PRN

## 2020-12-27 NOTE — Progress Notes (Signed)
ANTICOAGULATION CONSULT NOTE - Initial Consult  Pharmacy Consult for heparin Indication: DVT  No Known Allergies  Patient Measurements:  TBW 102.1 kg Heparin Dosing Weight: ~100 kg  Vital Signs: Temp: 98.3 F (36.8 C) (01/04 1351) Temp Source: Oral (01/04 1351) BP: 145/95 (01/04 1351) Pulse Rate: 70 (01/04 1351)  Labs: Recent Labs    12/27/20 0318  HGB 12.2*  HCT 41.4  PLT 247  LABPROT 14.1  INR 1.1  CREATININE 3.39*    CrCl cannot be calculated (Unknown ideal weight.).   Medical History: Past Medical History:  Diagnosis Date  . Cancer [facility])    prostate  . Gallstones   . Hypertension   . Stone, kidney   . Stone, kidney     Assessment: 87 yo M presents with complaint of blood clot. [facility] today and given a dose of enoxaparin around 3am. Plan to admit and may need IR consult. Will plan no heparin bolus due to recent enoxaparin in the setting of AKI. Hgb 12.2, plts wnl.  Goal of Therapy:  Heparin level 0.3-0.7 units/ml Monitor platelets by anticoagulation protocol: Yes   Plan:  No heparin bolus Start heparin gtt at 1,700 units/hr Check 8 hr heparin level Monitor daily heparin level, CBC, s/s of bleed  [name], PharmD, BCPS, BCIDP Clinical Pharmacist 12/27/2020 2:35 PM

## 2020-12-27 NOTE — H&P (Signed)
History and Physical        Hospital Admission Note Date: 12/27/2020  Patient name: [name] [name] Medical record number: [id number] Date of birth: Mar 12, 1933 Age: 87 y.o. Gender: male  PCP: [facility]   Chief Complaint    Chief Complaint  Patient presents with  . blood clot      HPI:   History obtained from prior notes and wife at bedside as the patient is a poor historian due to his dementia  This is an 87 year old male with past medical history of prostate cancer, dementia, hypertension, CKD 4, nephrolithiasis who presented to the ED with left lower extremity swelling that started 12/25/2020.  He initially presented to [facility] in the evening of 1/3 and was found to have an elevated D-dimer of >20 concerning for DVT.  Unfortunately, ultrasound was unavailable to perform the study at that point in time and so he was given a shot of Lovenox, discharged with Xarelto and told to follow-up today, 12/27/2020 for outpatient vascular study. He currently has no complaints.   ED Course: Afebrile, hemodynamically stable, on room air. Notable Labs: Sodium 144, K4.7, glucose 131, BUN 41, creatinine 2.59 (was 3.39 yesterday), WBC 11.1 (was 12.6 yesterday), Hb 12.2, platelets 260, D-dimer >20, COVID 19 pending. Notable Imaging: Lower extremity US: Acute DVT involving the left common femoral vein, SF junction, femoral vein, proximal profunda vein, popliteal vein, posterior tibial vein and gastrocnemius veins. Patient was started on a heparin drip and ED MD consulted interventional radiology who feels the patient may be a candidate for intervention.    Vitals:   12/27/20 1440 12/27/20 1500  BP:  (!) 147/94  Pulse: 67 (!) 55  Resp: 19 19  Temp:    SpO2: 97% 96%     Review of Systems:  Review of Systems  All other systems reviewed and are  negative.   Medical/Social/Family History   Past Medical History: Past Medical History:  Diagnosis Date  . Cancer [facility])    prostate  . Gallstones   . Hypertension   . Stone, kidney   . Stone, kidney     Past Surgical History:  Procedure Laterality Date  . PROSTATE SURGERY      Medications: Prior to Admission medications   Medication Sig Start Date End Date Taking? Authorizing Provider  amLODipine (NORVASC) 10 MG tablet Take 10 mg by mouth daily.   Yes [provider]  aspirin EC 81 MG tablet Take 81 mg by mouth daily.   Yes [provider]  Cholecalciferol (VITAMIN D3) 2000 UNITS TABS Take 2,000 Units by mouth daily.   Yes [provider]  Cyanocobalamin (VITAMIN B 12 PO) Take 500 mcg by mouth daily.    Yes [provider]  diclofenac Sodium (VOLTAREN) 1 % GEL Apply 4 g topically 4 (four) times daily as needed (pain).   Yes [provider]  donepezil (ARICEPT) 10 MG tablet Take 10 mg by mouth every morning.    Yes [provider]  ferrous sulfate 325 (65 FE) MG tablet Take 325 mg by mouth every Monday, Wednesday, and Friday.   Yes [provider]  furosemide (LASIX) 20 MG tablet Take 20 mg by  mouth daily as needed for fluid.   Yes [provider]  glipiZIDE (GLUCOTROL) 5 MG tablet Take 5 mg by mouth 2 (two) times daily before a meal.   Yes [provider]  LORazepam (ATIVAN) 0.5 MG tablet Take 0.5 mg by mouth every 8 (eight) hours as needed for anxiety.    Yes [provider]  mirabegron ER (MYRBETRIQ) 50 MG TB24 tablet Take 50 mg by mouth daily.   Yes [provider]  Multiple Vitamins-Minerals (MULTIVITAMIN ADULT) CHEW Chew 1 tablet by mouth daily.   Yes [provider]  QUEtiapine (SEROQUEL) 25 MG tablet Take 50 mg by mouth 2 (two) times daily.   Yes [provider]  RIVAROXABAN [name]) VTE STARTER PACK (15 & 20 MG) Follow package directions: Take one 15mg   tablet by mouth twice a day. On day 22, switch to one 20mg  tablet once a day. Take with food. 12/27/20   Veryl Speak, MD    Allergies:  No Known Allergies  Social History:  reports that he quit smoking about 48 years ago. He has a 10.00 pack-year smoking history. He has never used smokeless tobacco. He reports that he does not drink alcohol and does not use drugs.  Family History: Family History  Problem Relation Age of Onset  . Lung cancer Sister      Objective   Physical Exam: Blood pressure (!) 147/94, pulse (!) 55, temperature 98.3 F (36.8 C), temperature source Oral, resp. rate 19, height 6\' 1"  (1.854 m), weight 102.1 kg, SpO2 96 %.  Physical Exam Vitals and nursing note reviewed.  Constitutional:      Appearance: Normal appearance.  HENT:     Head: Normocephalic and atraumatic.  Eyes:     Conjunctiva/sclera: Conjunctivae normal.  Cardiovascular:     Rate and Rhythm: Normal rate and regular rhythm.  Pulmonary:     Effort: Pulmonary effort is normal.     Breath sounds: Normal breath sounds.  Abdominal:     General: Abdomen is flat.     Palpations: Abdomen is soft.  Musculoskeletal:     Comments: Extensive left lower extremity edema  Skin:    Coloration: Skin is not jaundiced or pale.  Neurological:     Mental Status: He is alert. Mental status is at baseline.  Psychiatric:        Mood and Affect: Mood normal.        Behavior: Behavior normal.     LABS on Admission: I have personally reviewed all the labs and imaging below    Basic Metabolic Panel: Recent Labs  Lab 12/27/20 0318 12/27/20 1426  NA 143 144  K 4.5 4.7  CL 104 106  CO2 26 28  GLUCOSE 185* 131*  BUN 40* 41*  CREATININE 3.39* 2.59*  CALCIUM 9.2 9.7   Liver Function Tests: No results for input(s): AST, ALT, ALKPHOS, BILITOT, PROT, ALBUMIN in the last 168 hours. No results for input(s): LIPASE, AMYLASE in the last 168 hours. No results for input(s): AMMONIA in the last 168  hours. CBC: Recent Labs  Lab 12/27/20 0318 12/27/20 1426  WBC 12.6* 11.1*  NEUTROABS 9.5* 8.4*  HGB 12.2* 12.2*  HCT 41.4 41.6  MCV 75.4* 75.4*  PLT 247 260   Cardiac Enzymes: No results for input(s): CKTOTAL, CKMB, CKMBINDEX, TROPONINI in the last 168 hours. BNP: Invalid input(s): POCBNP CBG: No results for input(s): GLUCAP in the last 168 hours.  Radiological Exams on Admission:  VAS [place] LOWER EXTREMITY VENOUS (  DVT)  Result Date: 12/27/2020  Lower Venous DVT Study Indications: Edema.  Comparison Study: No previous exam. Performing Technologist: Vonzell Schlatter RVT  Examination Guidelines: A complete evaluation includes B-mode imaging, spectral Doppler, color Doppler, and power Doppler as needed of all accessible portions of each vessel. Bilateral testing is considered an integral part of a complete examination. Limited examinations for reoccurring indications may be performed as noted. The reflux portion of the exam is performed with the patient in reverse Trendelenburg.  +-----+---------------+---------+-----------+----------+--------------+ RIGHTCompressibilityPhasicitySpontaneityPropertiesThrombus Aging +-----+---------------+---------+-----------+----------+--------------+ CFV  Full           Yes      Yes                                 +-----+---------------+---------+-----------+----------+--------------+ SFJ  Full                                                        +-----+---------------+---------+-----------+----------+--------------+   +---------+---------------+---------+-----------+----------+--------------+ LEFT     CompressibilityPhasicitySpontaneityPropertiesThrombus Aging +---------+---------------+---------+-----------+----------+--------------+ CFV      None           No       No                                  +---------+---------------+---------+-----------+----------+--------------+ SFJ      None                                                         +---------+---------------+---------+-----------+----------+--------------+ FV Prox  None                                                        +---------+---------------+---------+-----------+----------+--------------+ FV Mid   None                                                        +---------+---------------+---------+-----------+----------+--------------+ FV DistalNone                                                        +---------+---------------+---------+-----------+----------+--------------+ PFV      None                                                        +---------+---------------+---------+-----------+----------+--------------+ POP      None           No  No                                  +---------+---------------+---------+-----------+----------+--------------+ PTV      None                                                        +---------+---------------+---------+-----------+----------+--------------+ PERO                                                  Not visualized +---------+---------------+---------+-----------+----------+--------------+ Gastroc  None                                                        +---------+---------------+---------+-----------+----------+--------------+   Left Technical Findings: Not visualized segments include Peroneal veins.   Summary: RIGHT: - No evidence of deep vein thrombosis in the lower extremity. No indirect evidence of obstruction proximal to the inguinal ligament.  LEFT: - Findings consistent with acute deep vein thrombosis involving the left common femoral vein, SF junction, left femoral vein, left proximal profunda vein, left popliteal vein, left posterior tibial veins, and left gastrocnemius veins. - Acute DVT in left EIV.  *See table(s) above for measurements and observations. Electronically signed by Curt Jews MD on 12/27/2020 at 1:59:41 PM.    Final        EKG: Not done   A & P   Principal Problem:   DVT (deep venous thrombosis) (HCC) Active Problems:   HTN (hypertension)   CKD (chronic kidney disease) stage 4, GFR 15-29 ml/min (HCC)   Acute kidney injury superimposed on chronic kidney disease ([place])   1. Acute unprovoked left lower extremity DVT  a. Lower extremity US: Acute DVT involving the left common femoral vein, SF junction, femoral vein, proximal profunda vein, popliteal vein, posterior tibial vein and gastrocnemius veins.  b. IR consulted: noncontrast MR venogram of the abdomen pelvis to evaluate central extent of thrombus.  Continue anticoagulation and if not tolerated IR is available for IVC filter.  IR to follow-up in the clinic in 2 weeks c. Heparin drip d. Unclear why he is on aspirin, held for now while on full dose anticoagulation  2. Hypertension a. Continue home amlodipine  3. AKI on CKD 4, improving a. Creatinine 3.39 -> 2.59, baseline 2.1 b. Follow-up after IV fluids  4. Dementia a. Continue home meds  5. Diabetes a. Hb A1c b. carb controlled diet    DVT prophylaxis: Heparin drip   Code Status: Full Code  Diet: Heart healthy carb modified Family Communication: Admission, patients condition and plan of care including tests being ordered have been discussed with the patient who indicates understanding and agrees with the plan and Code Status. Patient's wife was updated  Disposition Plan: The appropriate patient status for this patient is INPATIENT. Inpatient status is judged to be reasonable and necessary in order to provide the required intensity of service to ensure the patient's safety. The patient's presenting symptoms, physical exam findings, and  initial radiographic and laboratory data in the context of their chronic comorbidities is felt to place them at high risk for further clinical deterioration. Furthermore, it is not anticipated that the patient will be medically stable for discharge from  the hospital within 2 midnights of admission. The following factors support the patient status of inpatient.   " The patient's presenting symptoms include left lower extremity swelling. " The worrisome physical exam findings include lower extremity swelling. " The initial radiographic and laboratory data are worrisome because of extensive DVT. " The chronic co-morbidities include dementia, htn.   * I certify that at the point of admission it is my clinical judgment that the patient will require inpatient hospital care spanning beyond 2 midnights from the point of admission due to high intensity of service, high risk for further deterioration and high frequency of surveillance required.*   Status is: Inpatient  Remains inpatient appropriate because:IV treatments appropriate due to intensity of illness or inability to take PO and Inpatient level of care appropriate due to severity of illness   Dispo: The patient is from: Home              Anticipated d/c is to: Home              Anticipated d/c date is: 2 days              Patient currently is not medically stable to d/c.     Consultants  . IR  Procedures  . Heparin drip  Time Spent on Admission: 60 minutes    Harold Hedge, DO [facility]  12/27/2020, 7:27 PM

## 2020-12-27 NOTE — ED Provider Notes (Signed)
[place] [facility] DEPT Provider Note   CSN: [id number] Arrival date & time: 12/27/20  1339     History No chief complaint on file.   [name] [name] is a 87 y.o. male.  The history is provided by the patient and medical records. No language interpreter was used.  Leg Pain Location:  Leg Time since incident:  3 days Injury: no   Leg location:  L leg, L lower leg and L upper leg Pain details:    Quality:  Aching   Severity:  No pain   Timing:  Constant Chronicity:  New Tetanus status:  Unknown Prior injury to area:  No Relieved by:  Nothing Worsened by:  Nothing Associated symptoms: swelling   Associated symptoms: no back pain, no fatigue, no fever, no neck pain, no numbness, no stiffness and no tingling        Past Medical History:  Diagnosis Date  . Cancer [facility])    prostate  . Gallstones   . Hypertension   . Stone, kidney   . Stone, kidney     Patient Active Problem List   Diagnosis Date Noted  . Acute kidney injury superimposed on chronic kidney disease ([place]) 03/26/2016  . Syncope and collapse 11/06/2015  . Hypertension 11/06/2015  . Carotid sinus syncope or syndrome 11/06/2015  . Acute renal failure syndrome ([place])   . CKD (chronic kidney disease) stage 4, GFR 15-29 ml/min (HCC) 03/27/2015  . Anemia of chronic disease 03/27/2015  . Syncope 12/02/2011  . HTN (hypertension) 12/02/2011    Past Surgical History:  Procedure Laterality Date  . PROSTATE SURGERY         Family History  Problem Relation Age of Onset  . Lung cancer Sister     Social History   Tobacco Use  . Smoking status: Former Smoker    Packs/day: 0.50    Years: 20.00    Pack years: 10.00    Quit date: 12/02/1972    Years since quitting: 48.1  . Smokeless tobacco: Never Used  Substance Use Topics  . Alcohol use: No  . Drug use: No    Home Medications Prior to Admission medications   Medication Sig Start Date End Date Taking? Authorizing Provider   acetaminophen (TYLENOL) 500 MG tablet Take 1,000 mg by mouth daily as needed for mild pain.     [provider]  Alogliptin Benzoate 12.5 MG TABS Take 6.25 mg by mouth daily.    [provider]  amLODipine (NORVASC) 10 MG tablet Take 10 mg by mouth daily.      [provider]  aspirin EC 81 MG tablet Take 81 mg by mouth daily.    [provider]  Cholecalciferol (VITAMIN D3) 2000 UNITS TABS Take 2,000 Units by mouth daily.    [provider]  Cyanocobalamin (VITAMIN B 12 PO) Take 500 mcg by mouth daily.     [provider]  diclofenac Sodium (VOLTAREN) 1 % GEL Apply 4 g topically 4 (four) times daily as needed (pain).    [provider]  donepezil (ARICEPT) 10 MG tablet Take 10 mg by mouth every morning.     [provider]  ferrous sulfate 325 (65 FE) MG tablet Take 325 mg by mouth every Monday, Wednesday, and Friday.    [provider]  furosemide (LASIX) 20 MG tablet Take 20 mg by mouth daily as needed for fluid.    [provider]  glipiZIDE (GLUCOTROL) 5 MG tablet Take 5  mg by mouth 2 (two) times daily before a meal.    [provider]  LORazepam (ATIVAN) 0.5 MG tablet Take 0.5 mg by mouth every 8 (eight) hours as needed for anxiety.     [provider]  mirabegron ER (MYRBETRIQ) 50 MG TB24 tablet Take 50 mg by mouth daily.    [provider]  Multiple Vitamins-Minerals (MULTIVITAMIN ADULT) CHEW Chew 1 tablet by mouth daily.    [provider]  QUEtiapine (SEROQUEL) 25 MG tablet Take 50 mg by mouth 2 (two) times daily.    [provider]  RIVAROXABAN [name]) VTE STARTER PACK (15 & 20 MG) Follow package directions: Take one 15mg  tablet by mouth twice a day. On day 22, switch to one 20mg  tablet once a day. Take with food. 12/27/20   Veryl Speak, MD    Allergies    Patient has no known allergies.  Review of Systems   Review of Systems  Constitutional:  Negative for chills, fatigue and fever.  HENT: Negative for congestion.   Respiratory: Negative for cough, chest tightness, shortness of breath and wheezing.   Cardiovascular: Negative for chest pain.  Gastrointestinal: Negative for abdominal pain, constipation, diarrhea, nausea and vomiting.  Genitourinary: Negative for flank pain and frequency.  Musculoskeletal: Negative for back pain, neck pain, neck stiffness and stiffness.  Skin: Positive for color change (left leg redness). Negative for rash and wound.  Psychiatric/Behavioral: Negative for agitation.  All other systems reviewed and are negative.   Physical Exam Updated Vital Signs BP (!) 145/95 (BP Location: Right Arm)   Pulse 70   Temp 98.3 F (36.8 C) (Oral)   Resp 18   SpO2 98%   Physical Exam Vitals and nursing note reviewed.  Constitutional:      General: He is not in acute distress.    Appearance: He is well-developed and well-nourished. He is not ill-appearing, toxic-appearing or diaphoretic.  HENT:     Head: Normocephalic and atraumatic.     Nose: No congestion or rhinorrhea.     Mouth/Throat:     Mouth: Mucous membranes are moist.     Pharynx: No oropharyngeal exudate or posterior oropharyngeal erythema.  Eyes:     Conjunctiva/sclera: Conjunctivae normal.     Pupils: Pupils are equal, round, and reactive to light.  Cardiovascular:     Rate and Rhythm: Normal rate and regular rhythm.     Pulses: Normal pulses.     Heart sounds: No murmur heard.   Pulmonary:     Effort: Pulmonary effort is normal. No respiratory distress.     Breath sounds: Normal breath sounds. No stridor. No wheezing, rhonchi or rales.  Chest:     Chest wall: No tenderness.  Abdominal:     General: Abdomen is flat.     Palpations: Abdomen is soft.     Tenderness: There is no abdominal tenderness. There is no right CVA tenderness, left CVA tenderness, guarding or rebound.  Musculoskeletal:        General: Swelling present. No  tenderness or signs of injury.     Cervical back: Neck supple. No tenderness.     Right lower leg: No edema.     Left lower leg: Edema present.  Skin:    General: Skin is warm and dry.     Capillary Refill: Capillary refill takes less than 2 seconds.     Findings: Erythema present.  Neurological:     General: No focal deficit present.  Mental Status: He is alert. Mental status is at baseline.     Sensory: No sensory deficit.     Motor: No weakness.  Psychiatric:        Mood and Affect: Mood and affect and mood normal.     ED Results / Procedures / Treatments   Labs (all labs ordered are listed, but only abnormal results are displayed) Labs Reviewed  CBC WITH DIFFERENTIAL/PLATELET - Abnormal; Notable for the following components:      Result Value   WBC 11.1 (*)    Hemoglobin 12.2 (*)    MCV 75.4 (*)    MCH 22.1 (*)    MCHC 29.3 (*)    Neutro Abs 8.4 (*)    All other components within normal limits  BASIC METABOLIC PANEL - Abnormal; Notable for the following components:   Glucose, Bld 131 (*)    BUN 41 (*)    Creatinine, Ser 2.59 (*)    GFR, Estimated 24 (*)    All other components within normal limits  RESP PANEL BY RT-PCR (FLU A&B, COVID) ARPGX2  HEPARIN LEVEL (UNFRACTIONATED)    EKG None  Radiology VAS [place] LOWER EXTREMITY VENOUS (DVT)  Result Date: 12/27/2020  Lower Venous DVT Study Indications: Edema.  Comparison Study: No previous exam. Performing Technologist: Vonzell Schlatter RVT  Examination Guidelines: A complete evaluation includes B-mode imaging, spectral Doppler, color Doppler, and power Doppler as needed of all accessible portions of each vessel. Bilateral testing is considered an integral part of a complete examination. Limited examinations for reoccurring indications may be performed as noted. The reflux portion of the exam is performed with the patient in reverse Trendelenburg.  +-----+---------------+---------+-----------+----------+--------------+  RIGHTCompressibilityPhasicitySpontaneityPropertiesThrombus Aging +-----+---------------+---------+-----------+----------+--------------+ CFV  Full           Yes      Yes                                 +-----+---------------+---------+-----------+----------+--------------+ SFJ  Full                                                        +-----+---------------+---------+-----------+----------+--------------+   +---------+---------------+---------+-----------+----------+--------------+ LEFT     CompressibilityPhasicitySpontaneityPropertiesThrombus Aging +---------+---------------+---------+-----------+----------+--------------+ CFV      None           No       No                                  +---------+---------------+---------+-----------+----------+--------------+ SFJ      None                                                        +---------+---------------+---------+-----------+----------+--------------+ FV Prox  None                                                        +---------+---------------+---------+-----------+----------+--------------+ FV Mid  None                                                        +---------+---------------+---------+-----------+----------+--------------+ FV DistalNone                                                        +---------+---------------+---------+-----------+----------+--------------+ PFV      None                                                        +---------+---------------+---------+-----------+----------+--------------+ POP      None           No       No                                  +---------+---------------+---------+-----------+----------+--------------+ PTV      None                                                        +---------+---------------+---------+-----------+----------+--------------+ PERO                                                  Not  visualized +---------+---------------+---------+-----------+----------+--------------+ Gastroc  None                                                        +---------+---------------+---------+-----------+----------+--------------+   Left Technical Findings: Not visualized segments include Peroneal veins.   Summary: RIGHT: - No evidence of deep vein thrombosis in the lower extremity. No indirect evidence of obstruction proximal to the inguinal ligament.  LEFT: - Findings consistent with acute deep vein thrombosis involving the left common femoral vein, SF junction, left femoral vein, left proximal profunda vein, left popliteal vein, left posterior tibial veins, and left gastrocnemius veins. - Acute DVT in left EIV.  *See table(s) above for measurements and observations. Electronically signed by Curt Jews MD on 12/27/2020 at 1:59:41 PM.    Final     Procedures Procedures (including critical care time)  CRITICAL CARE Performed by: Gwenyth Allegra Lorene Klimas Total critical care time: 35 minutes Critical care time was exclusive of separately billable procedures and treating other patients. Critical care was necessary to treat or prevent imminent or life-threatening deterioration. Critical care was time spent personally by me on the following activities: development of treatment plan with patient and/or surrogate as well as nursing, discussions with consultants, evaluation of patient's response to treatment, examination of patient, obtaining  history from patient or surrogate, ordering and performing treatments and interventions, ordering and review of laboratory studies, ordering and review of radiographic studies, pulse oximetry and re-evaluation of patient's condition.   Medications Ordered in ED Medications  heparin ADULT infusion 100 units/mL (25000 units/271mL) (1,700 Units/hr Intravenous New Bag/Given 12/27/20 1516)  sodium chloride 0.9 % bolus 500 mL (500 mLs Intravenous New Bag/Given 12/27/20 1438)     ED Course  I have reviewed the triage vital signs and the nursing notes.  Pertinent labs & imaging results that were available during my care of the patient were reviewed by me and considered in my medical decision making (see chart for details).    MDM Rules/Calculators/A&P                          Brayden Brodhead is a 87 y.o. male with a past medical history significant for carotid disease, kidney disease, hypertension, family report of dementia, and anemia of chronic disease who presents for new DVT found in left leg.  According to family, patient has had symptoms for the last 3 days with swelling in his left leg.  He reports that he has had some fluid buildup in the past and has been on Lasix but since the last 36 hours has had rapidly worsening edema in the left leg.  He went to [facility] last night where he was given a dose of Lovenox and told to come get an ultrasound this morning.  I was called by the ultrasound team after they performed the exam that he has a large clot burden in his left leg going all the way up to his external iliac vein.  Given the large amount of clot and the proximal nature of this clot, patient was sent to the emergency department for evaluation and management.  He is denying any chest pain or shortness of breath and denies any palpitations.  He also denies any fevers, chills, chest pain, shortness of breath, nausea vomiting, or urinary changes.  The wife is there and reports he does have some dementia and he is not the best historian but she reports he has not complained of any thoracic symptoms.  On exam, patient does have very swollen left leg.  He has a palpable DP and PT pulse on my exam and has normal sensation and strength in the legs.  Abdomen is nontender.  Pelvis nontender.  Lungs clear and chest nontender.  Patient resting comfortably without tachycardia, hypoxia, or tachypnea.  He is not hypotensive.  Afebrile.  Clinically I am concerned about  this proximal large clot that was seen today.  I had a discussion with the family and we will start him on heparin instead of the Xarelto he was initially prescribed for possible outpatient management as he also has acute kidney injury seen.  We will touch base with interventional radiology given the proximal nature of the clot to see if this is something they would want to intervene on but we will admit him for heparinization and further monitoring and management of his AKI regardless.  We will get the preadmission Covid test.  At this time, given lack of any chest or lung symptoms, will hold on CT PE study.  Patient's kidneys will also not allow contrast at this time.  Anticipate touching base with IR to determine plan.   2:46 PM Spoke with interventional radiology who was going to send one of their team members to  evaluate the patient.  They do think he may be a candidate for intervention.  They agreed with heparinization and admission to medicine service given the new AKI.  They say typically they would get CT imaging with venogram and contrast but obviously the patient is not a candidate at this time.  We will give some fluids to help hydrate and help with his AKI.  We will continue to monitor the patient.  Will call medicine for admission for further management.     Medicine will see the patient for admission for further management of AKI and proximal DVT.  IR is coming to see the patient see if he is a candidate for interventional management.    Final Clinical Impression(s) / ED Diagnoses Final diagnoses:  Acute deep vein thrombosis (DVT) of iliac vein of left lower extremity (HCC)    Clinical Impression: 1. Acute deep vein thrombosis (DVT) of iliac vein of left lower extremity (HCC)     Disposition: Admit  This note was prepared with assistance of Dragon voice recognition software. Occasional wrong-word or sound-a-like substitutions may have occurred due to the inherent limitations of  voice recognition software.     Rhonna Holster, Gwenyth Allegra, MD 12/27/20 1538

## 2020-12-27 NOTE — ED Triage Notes (Signed)
Pt arrives via POV with wife stating that they were told to come here from [facility] due to a blood clot seen in pts Left Lower extremity. Leg swollen and warm to touch.

## 2020-12-27 NOTE — Discharge Instructions (Addendum)
Call the [facility] vascular lab in the morning to ensure they have received the order for your study.  Their number is [phone].  If you have the study performed and it shows a blood clot, begin taking the medication Xarelto that was prescribed yesterday evening.  If the study is negative, follow-up with your primary doctor and do not begin taking this medication.

## 2020-12-27 NOTE — ED Notes (Signed)
ED Provider at bedside. 

## 2020-12-27 NOTE — Progress Notes (Signed)
Left lower extremity venous study completed.   Spoke with Dr Marvia Pickles in ER about results.  Patient taken down to check in   Please see CV Proc for preliminary results.   Vonzell Schlatter, RVT

## 2020-12-27 NOTE — ED Provider Notes (Signed)
[name] Provider Note   CSN: [id number] Arrival date & time: 12/26/20  2119     History Chief Complaint  Patient presents with  . Leg Swelling    [name] [name] is a 87 y.o. male.  Patient is an 87 year old male with history of dementia, hypertension, chronic renal insufficiency, anemia.  He presents today for evaluation of left leg swelling.  This began yesterday in the absence of any injury or trauma.  Patient does describe pain.  History is somewhat limited from the patient secondary to dementia and wife contributes the majority of history.  He denies to me he is having any chest pain or shortness of breath.  Pain is worse with ambulation.  The history is provided by the patient and the spouse.       Past Medical History:  Diagnosis Date  . Cancer [facility])    prostate  . Gallstones   . Hypertension   . Stone, kidney   . Stone, kidney     Patient Active Problem List   Diagnosis Date Noted  . Acute kidney injury superimposed on chronic kidney disease ([place]) 03/26/2016  . Syncope and collapse 11/06/2015  . Hypertension 11/06/2015  . Carotid sinus syncope or syndrome 11/06/2015  . Acute renal failure syndrome ([place])   . CKD (chronic kidney disease) stage 4, GFR 15-29 ml/min (HCC) 03/27/2015  . Anemia of chronic disease 03/27/2015  . Syncope 12/02/2011  . HTN (hypertension) 12/02/2011    Past Surgical History:  Procedure Laterality Date  . PROSTATE SURGERY         Family History  Problem Relation Age of Onset  . Lung cancer Sister     Social History   Tobacco Use  . Smoking status: Former Smoker    Packs/day: 0.50    Years: 20.00    Pack years: 10.00    Quit date: 12/02/1972    Years since quitting: 48.1  . Smokeless tobacco: Never Used  Substance Use Topics  . Alcohol use: No  . Drug use: No    Home Medications Prior to Admission medications   Medication Sig Start Date End Date Taking? Authorizing Provider   acetaminophen (TYLENOL) 500 MG tablet Take 1,000 mg by mouth daily as needed for mild pain.     [provider]  Alogliptin Benzoate 12.5 MG TABS Take 6.25 mg by mouth daily.    [provider]  amLODipine (NORVASC) 10 MG tablet Take 10 mg by mouth daily.      [provider]  aspirin EC 81 MG tablet Take 81 mg by mouth daily.    [provider]  Cholecalciferol (VITAMIN D3) 2000 UNITS TABS Take 2,000 Units by mouth daily.    [provider]  Cyanocobalamin (VITAMIN B 12 PO) Take 500 mcg by mouth daily.     [provider]  diclofenac Sodium (VOLTAREN) 1 % GEL Apply 4 g topically 4 (four) times daily as needed (pain).    [provider]  donepezil (ARICEPT) 10 MG tablet Take 10 mg by mouth every morning.     [provider]  ferrous sulfate 325 (65 FE) MG tablet Take 325 mg by mouth every Monday, Wednesday, and Friday.    [provider]  furosemide (LASIX) 20 MG tablet Take 20 mg by mouth daily as needed for fluid.    [provider]  glipiZIDE (GLUCOTROL) 5 MG tablet Take 5 mg by mouth 2 (two) times daily before a meal.  [provider]  LORazepam (ATIVAN) 0.5 MG tablet Take 0.5 mg by mouth every 8 (eight) hours as needed for anxiety.     [provider]  mirabegron ER (MYRBETRIQ) 50 MG TB24 tablet Take 50 mg by mouth daily.    [provider]  Multiple Vitamins-Minerals (MULTIVITAMIN ADULT) CHEW Chew 1 tablet by mouth daily.    [provider]  QUEtiapine (SEROQUEL) 25 MG tablet Take 50 mg by mouth 2 (two) times daily.    [provider]    Allergies    Patient has no known allergies.  Review of Systems   Review of Systems  Unable to perform ROS: Dementia    Physical Exam Updated Vital Signs BP (!) 140/91 (BP Location: Right Arm)   Pulse 80   Temp 97.7 F (36.5 C) (Oral)   Resp 18   SpO2 100%   Physical Exam Vitals and nursing note reviewed.   Constitutional:      General: He is not in acute distress.    Appearance: He is well-developed and well-nourished. He is not diaphoretic.  HENT:     Head: Normocephalic and atraumatic.     Mouth/Throat:     Mouth: Oropharynx is clear and moist.  Cardiovascular:     Rate and Rhythm: Normal rate and regular rhythm.     Heart sounds: No murmur heard. No friction rub.  Pulmonary:     Effort: Pulmonary effort is normal. No respiratory distress.     Breath sounds: Normal breath sounds. No wheezing or rales.  Abdominal:     General: Bowel sounds are normal. There is no distension.     Palpations: Abdomen is soft.     Tenderness: There is no abdominal tenderness.  Musculoskeletal:        General: Swelling present. Normal range of motion.     Cervical back: Normal range of motion and neck supple.     Right lower leg: No edema.     Left lower leg: Edema present.     Comments: There is significant swelling of the left lower extremity.  There is some calf tenderness.  Bevelyn Buckles' sign is equivocal.  DP pulses are palpable and the foot is not cool to the touch.  It appears well perfused.  Skin:    General: Skin is warm and dry.  Neurological:     Mental Status: He is alert and oriented to person, place, and time.     Coordination: Coordination normal.     ED Results / Procedures / Treatments   Labs (all labs ordered are listed, but only abnormal results are displayed) Labs Reviewed  BASIC METABOLIC PANEL  D-DIMER, QUANTITATIVE (NOT AT [facility])  CBC WITH DIFFERENTIAL/PLATELET  PROTIME-INR    EKG None  Radiology No results found.  Procedures Procedures (including critical care time)  Medications Ordered in ED Medications  enoxaparin (LOVENOX) 100 mg/mL injection 1 mg/kg (has no administration in time range)    ED Course  I have reviewed the triage vital signs and the nursing notes.  Pertinent labs & imaging results that were available during my care of the patient were reviewed  by me and considered in my medical decision making (see chart for details).    MDM Rules/Calculators/A&P  Patient is an 87 year old male presenting with complaints of left leg swelling that started the day before.  Patient has an elevated D-dimer of greater than 20.  Based on his exam and D-dimer study, I am convinced that this represents a  DVT.  Patient was given a shot of Lovenox here and will have an outpatient vascular study performed as ultrasound is not here today, nor are they at [facility] tomorrow until the evening.  I have ordered this study to be performed at [facility] tomorrow.  Patient given a prescription for Xarelto which he is to begin taking if this study returns positive.  If not he is to follow-up with primary doctor.  Final Clinical Impression(s) / ED Diagnoses Final diagnoses:  None    Rx / DC Orders ED Discharge Orders         Ordered    VAS [place] LOWER EXTREMITY VENOUS (DVT)        12/27/20 4327           Veryl Speak, MD 12/27/20 [phone]

## 2020-12-27 NOTE — Consult Note (Signed)
Chief Complaint: Patient was seen in consultation today for acute left lower extremity DVT Chief Complaint  Patient presents with  . blood clot    Referring Physician(s): Dickey  Supervising Physician: Ruthann Cancer  Patient Status: [facility] - ED  History of Present Illness: [name] [name] is an 87 y.o. male with past medical history of remote prostate cancer, hypertension, nephrolithiasis, mild dementia, chronic renal insufficiency, anemia, prior tobacco abuse- quit in 1973, diabetes, and syncopal spells.  Patient presented to [facility] earlier this a.m. with new onset left lower extremity edema.  Per patient's wife she noticed swelling of left lower leg and foot this past Sunday while patient was lying in bed.  No prior history of DVT or trauma/injury to leg.  Patient did report some lower extremity pain with ambulation.  During exam today in ED he denied left lower extremity pain while supine or associated paresthesias.  Left leg is warm to touch with normal motor function.  No right lower extremity swelling. Lower extremity venous Doppler study today revealed acute DVT involving the left common femoral vein, SF junction, left femoral vein, left proximal profunda vein, left popliteal vein, left posterior tibial veins and left gastrocnemius veins.  There was acute DVT in the left EIV.  No right LE DVT noted.  Patient received dose of Lovenox earlier this a.m., was given prescription for Xarelto and is currently on IV heparin.  Current labs include D-dimer greater than 20, WBC 11.1, hemoglobin 12.2, platelets 260K, creatinine 2.59, PT/INR normal, COVID-19 pending.  Request now received from ED for treatment options involving the acute left lower extremity DVT. Past Medical History:  Diagnosis Date  . Cancer [facility])    prostate  . Gallstones   . Hypertension   . Stone, kidney   . Stone, kidney     Past Surgical History:  Procedure Laterality Date  . PROSTATE SURGERY       Allergies: Patient has no known allergies.  Medications: Prior to Admission medications   Medication Sig Start Date End Date Taking? Authorizing Provider  amLODipine (NORVASC) 10 MG tablet Take 10 mg by mouth daily.   Yes [provider]  aspirin EC 81 MG tablet Take 81 mg by mouth daily.   Yes [provider]  Cholecalciferol (VITAMIN D3) 2000 UNITS TABS Take 2,000 Units by mouth daily.   Yes [provider]  Cyanocobalamin (VITAMIN B 12 PO) Take 500 mcg by mouth daily.    Yes [provider]  diclofenac Sodium (VOLTAREN) 1 % GEL Apply 4 g topically 4 (four) times daily as needed (pain).   Yes [provider]  donepezil (ARICEPT) 10 MG tablet Take 10 mg by mouth every morning.    Yes [provider]  ferrous sulfate 325 (65 FE) MG tablet Take 325 mg by mouth every Monday, Wednesday, and Friday.   Yes [provider]  furosemide (LASIX) 20 MG tablet Take 20 mg by mouth daily as needed for fluid.   Yes [provider]  glipiZIDE (GLUCOTROL) 5 MG tablet Take 5 mg by mouth 2 (two) times daily before a meal.   Yes [provider]  LORazepam (ATIVAN) 0.5 MG tablet Take 0.5 mg by mouth every 8 (eight) hours as needed for anxiety.    Yes [provider]  mirabegron ER (MYRBETRIQ) 50 MG TB24 tablet Take 50 mg by mouth daily.   Yes [provider]  Multiple Vitamins-Minerals (MULTIVITAMIN ADULT) CHEW Chew 1 tablet by mouth  daily.   Yes [provider]  QUEtiapine (SEROQUEL) 25 MG tablet Take 50 mg by mouth 2 (two) times daily.   Yes [provider]  RIVAROXABAN [name]) VTE STARTER PACK (15 & 20 MG) Follow package directions: Take one 15mg  tablet by mouth twice a day. On day 22, switch to one 20mg  tablet once a day. Take with food. 12/27/20   Veryl Speak, MD     Family History  Problem Relation Age of Onset  . Lung cancer Sister     Social History   Socioeconomic History  .  Marital status: Married    Spouse name: Not on file  . Number of children: Not on file  . Years of education: Not on file  . Highest education level: Not on file  Occupational History  . Not on file  Tobacco Use  . Smoking status: Former Smoker    Packs/day: 0.50    Years: 20.00    Pack years: 10.00    Quit date: 12/02/1972    Years since quitting: 48.1  . Smokeless tobacco: Never Used  Substance and Sexual Activity  . Alcohol use: No  . Drug use: No  . Sexual activity: Not on file  Other Topics Concern  . Not on file  Social History Narrative  . Not on file   Social Determinants of Health   Financial Resource Strain: Not on file  Food Insecurity: Not on file  Transportation Needs: Not on file  Physical Activity: Not on file  Stress: Not on file  Social Connections: Not on file     Review of Systemssee above;  currently denies fever, headache, chest pain, dyspnea, cough, abdominal/back pain, nausea, vomiting or visible bleeding  Vital Signs: BP (!) 147/94   Pulse (!) 55   Temp 98.3 F (36.8 C) (Oral)   Resp 19   Ht 6\' 1"  (1.854 m)   Wt 225 lb (102.1 kg)   SpO2 96%   BMI 29.69 kg/m   Physical Exam patient awake, answers few simple questions okay; chest clear to auscultation bilaterally.  Heart with normal rate, some occasional ectopy.  Abdomen soft, positive bowel sounds, nontender.  No right lower extremity edema.  2-3+ left lower extremity edema noted, some mild erythema left inner upper thigh region; left leg is warm to touch, motor function okay; +/- LLE pulses but edema limits palpation  Imaging: VAS [place] LOWER EXTREMITY VENOUS (DVT)  Result Date: 12/27/2020  Lower Venous DVT Study Indications: Edema.  Comparison Study: No previous exam. Performing Technologist: Vonzell Schlatter RVT  Examination Guidelines: A complete evaluation includes B-mode imaging, spectral Doppler, color Doppler, and power Doppler as needed of all accessible portions of each vessel. Bilateral  testing is considered an integral part of a complete examination. Limited examinations for reoccurring indications may be performed as noted. The reflux portion of the exam is performed with the patient in reverse Trendelenburg.  +-----+---------------+---------+-----------+----------+--------------+ RIGHTCompressibilityPhasicitySpontaneityPropertiesThrombus Aging +-----+---------------+---------+-----------+----------+--------------+ CFV  Full           Yes      Yes                                 +-----+---------------+---------+-----------+----------+--------------+ SFJ  Full                                                        +-----+---------------+---------+-----------+----------+--------------+   +---------+---------------+---------+-----------+----------+--------------+  LEFT     CompressibilityPhasicitySpontaneityPropertiesThrombus Aging +---------+---------------+---------+-----------+----------+--------------+ CFV      None           No       No                                  +---------+---------------+---------+-----------+----------+--------------+ SFJ      None                                                        +---------+---------------+---------+-----------+----------+--------------+ FV Prox  None                                                        +---------+---------------+---------+-----------+----------+--------------+ FV Mid   None                                                        +---------+---------------+---------+-----------+----------+--------------+ FV DistalNone                                                        +---------+---------------+---------+-----------+----------+--------------+ PFV      None                                                        +---------+---------------+---------+-----------+----------+--------------+ POP      None           No       No                                   +---------+---------------+---------+-----------+----------+--------------+ PTV      None                                                        +---------+---------------+---------+-----------+----------+--------------+ PERO                                                  Not visualized +---------+---------------+---------+-----------+----------+--------------+ Gastroc  None                                                        +---------+---------------+---------+-----------+----------+--------------+  Left Technical Findings: Not visualized segments include Peroneal veins.   Summary: RIGHT: - No evidence of deep vein thrombosis in the lower extremity. No indirect evidence of obstruction proximal to the inguinal ligament.  LEFT: - Findings consistent with acute deep vein thrombosis involving the left common femoral vein, SF junction, left femoral vein, left proximal profunda vein, left popliteal vein, left posterior tibial veins, and left gastrocnemius veins. - Acute DVT in left EIV.  *See table(s) above for measurements and observations. Electronically signed by Curt Jews MD on 12/27/2020 at 1:59:41 PM.    Final     Labs:  CBC: Recent Labs    08/31/20 1932 12/27/20 0318 12/27/20 1426  WBC 8.9 12.6* 11.1*  HGB 11.3* 12.2* 12.2*  HCT 38.6* 41.4 41.6  PLT 177 247 260    COAGS: Recent Labs    12/27/20 0318  INR 1.1    BMP: Recent Labs    08/31/20 1932 12/27/20 0318 12/27/20 1426  NA 140 143 144  K 4.1 4.5 4.7  CL 107 104 106  CO2 26 26 28   GLUCOSE 103* 185* 131*  BUN 23 40* 41*  CALCIUM 8.6* 9.2 9.7  CREATININE 2.14* 3.39* 2.59*  GFRNONAA 27* 17* 24*  GFRAA 32*  --   --     LIVER FUNCTION TESTS: Recent Labs    08/31/20 1932  BILITOT 0.3  AST 10*  ALT 11  ALKPHOS 84  PROT 6.4*  ALBUMIN 3.2*    TUMOR MARKERS: No results for input(s): AFPTM, CEA, CA199, CHROMGRNA in the last 8760 hours.  Assessment and Plan: 87 y.o. male with past medical  history of remote prostate cancer, hypertension, nephrolithiasis, mild dementia, chronic renal insufficiency, anemia, prior tobacco abuse- quit in 1973, diabetes, and syncopal spells (primarily attributed to dehydration).  Patient presented to [facility] earlier this a.m. with new onset left lower extremity edema.  Per patient's wife she noticed swelling of left lower leg and foot this past Sunday while patient was lying in bed.  No prior history of DVT or trauma/injury to leg.  Patient did report some lower extremity pain with ambulation.  During exam today in ED he denied left lower extremity pain while supine or associated paresthesias.  Left leg is warm to touch with normal motor function.  No right lower extremity swelling. Lower extremity venous Doppler study today revealed acute DVT involving the left common femoral vein, SF junction, left femoral vein, left proximal profunda vein, left popliteal vein, left posterior tibial veins and left gastrocnemius veins.  There was acute DVT in the left EIV.  No right LE DVT noted.  Patient received dose of Lovenox earlier this a.m., was given prescription for Xarelto and is currently on IV heparin.  Current labs include D-dimer greater than 20, WBC 11.1, hemoglobin 12.2, platelets 260K, creatinine 2.59, PT/INR normal, COVID-19 pending.  Request now received from ED for treatment options involving the acute left lower extremity DVT.  Case has been reviewed by Dr. Serafina Royals.  Based on current assessment at this time recommend continued anticoagulation; consider time-of-flight MRV of the abdomen and pelvis (WITHOUT CONTRAST) to evaluate the extent of clot; we will schedule patient for follow-up in IR clinic with Dr. Serafina Royals in 2 weeks; depending on patient's ability to tolerate OP anticoagulation (somewhat risky with prior history of syncopal spells) may also need to consider IVC filter placement at some point following eval of MRV A/P.    Thank you for this  interesting consult.  I greatly enjoyed meeting [name] [name] and look forward to participating in their care.  A copy of this report was sent to the requesting provider on this date.  Electronically Signed: D. Rowe Robert, PA-C 12/27/2020, 3:53 PM   I spent a total of  40 minutes   in face to face in clinical consultation, greater than 50% of which was counseling/coordinating care for acute left lower extremity deep vein thrombosis

## 2020-12-27 NOTE — Progress Notes (Signed)
Transported to MRI via w/c.  Heparin gtt changed over to MRI safe pump.

## 2020-12-27 NOTE — Plan of Care (Signed)
Plan of care initiated and discussed with patient's wife.

## 2020-12-28 ENCOUNTER — Inpatient Hospital Stay (HOSPITAL_COMMUNITY): Payer: No Typology Code available for payment source

## 2020-12-28 DIAGNOSIS — N179 Acute kidney failure, unspecified: Secondary | ICD-10-CM | POA: Diagnosis not present

## 2020-12-28 DIAGNOSIS — N189 Chronic kidney disease, unspecified: Secondary | ICD-10-CM | POA: Diagnosis not present

## 2020-12-28 DIAGNOSIS — F0150 Vascular dementia without behavioral disturbance: Secondary | ICD-10-CM

## 2020-12-28 DIAGNOSIS — I82402 Acute embolism and thrombosis of unspecified deep veins of left lower extremity: Secondary | ICD-10-CM | POA: Diagnosis not present

## 2020-12-28 LAB — BASIC METABOLIC PANEL
Anion gap: 10 (ref 5–15)
BUN: 35 mg/dL — ABNORMAL HIGH (ref 8–23)
CO2: 26 mmol/L (ref 22–32)
Calcium: 9 mg/dL (ref 8.9–10.3)
Chloride: 109 mmol/L (ref 98–111)
Creatinine, Ser: 2.53 mg/dL — ABNORMAL HIGH (ref 0.61–1.24)
GFR, Estimated: 24 mL/min — ABNORMAL LOW (ref >60)
Glucose, Bld: 128 mg/dL — ABNORMAL HIGH (ref 70–99)
Potassium: 4.6 mmol/L (ref 3.5–5.1)
Sodium: 145 mmol/L (ref 135–145)

## 2020-12-28 LAB — CBC
HCT: 35.9 % — ABNORMAL LOW (ref 39.0–52.0)
Hemoglobin: 10.6 g/dL — ABNORMAL LOW (ref 13.0–17.0)
MCH: 22.5 pg — ABNORMAL LOW (ref 26.0–34.0)
MCHC: 29.5 g/dL — ABNORMAL LOW (ref 30.0–36.0)
MCV: 76.1 fL — ABNORMAL LOW (ref 80.0–100.0)
Platelets: 214 10*3/uL (ref 150–400)
RBC: 4.72 MIL/uL (ref 4.22–5.81)
RDW: 14.2 % (ref 11.5–15.5)
WBC: 9.7 10*3/uL (ref 4.0–10.5)
nRBC: 0 % (ref 0.0–0.2)

## 2020-12-28 LAB — HEPARIN LEVEL (UNFRACTIONATED)
Heparin Unfractionated: 1.08 IU/mL — ABNORMAL HIGH (ref 0.30–0.70)
Heparin Unfractionated: 1.22 IU/mL — ABNORMAL HIGH (ref 0.30–0.70)
Heparin Unfractionated: 0.6 IU/mL (ref 0.30–0.70)

## 2020-12-28 MED ORDER — HEPARIN (PORCINE) 25000 UT/250ML-% IV SOLN
1000.0000 [IU]/h | INTRAVENOUS | Status: DC
  Administered 2020-12-28: 1050 [IU]/h via INTRAVENOUS
  Administered 2020-12-29: 1000 [IU]/h via INTRAVENOUS
  Filled 2020-12-28 (×2): qty 250

## 2020-12-28 NOTE — Progress Notes (Signed)
ANTICOAGULATION CONSULT NOTE - Follow Up Consult  Pharmacy Consult for Heparin Indication: DVT  No Known Allergies  Patient Measurements: Height: 6\' 1"  (185.4 cm) Weight: 102.1 kg (225 lb) IBW/kg (Calculated) : 79.9 Heparin Dosing Weight: 100kg  Vital Signs: BP: 148/84 (01/05 1441) Pulse Rate: 75 (01/05 1441)  Labs: Recent Labs    12/27/20 0318 12/27/20 1426 12/27/20 2229 12/28/20 0446 12/28/20 0950 12/28/20 2028  HGB 12.2* 12.2*  --  10.6*  --   --   HCT 41.4 41.6  --  35.9*  --   --   PLT 247 260  --  214  --   --   LABPROT 14.1  --   --   --   --   --   INR 1.1  --   --   --   --   --   HEPARINUNFRC  --   --    < > 1.08* 1.22* 0.60  CREATININE 3.39* 2.59*  --  2.53*  --   --    < > = values in this interval not displayed.    Estimated Creatinine Clearance: 26.8 mL/min (A) (by C-G formula based on SCr of 2.53 mg/dL (H)).   Medications:  Scheduled:  . amLODipine  10 mg Oral Daily  . donepezil  10 mg Oral QHS  . QUEtiapine  50 mg Oral BID   Infusions:  . heparin 1,050 Units/hr (12/28/20 1303)    Assessment: 52 yoM admitted with acute DVT and AKI; pharmacy is consulted to dose Heparin IV.  He was initially seen at [facility] on 12/28/19 and given a dose of enoxaparin around 3am and an outpatient prescription for Xarelto.  Admitted for IR consult. Will plan no heparin bolus due to recent enoxaparin in the setting of AKI.   Today, 12/28/2020: Heparin level @ 2028 is therapeutic at 0.6 after heparin held x 1 hours and rate decreased to 1050 units/hr. SCr elevated, but improved to 2.5 CBC: Hgb decreased to 10.6, Plt WNL No bleeding or complications reported per RN.  Goal of Therapy:  Heparin level 0.3-0.7 units/ml Monitor platelets by anticoagulation protocol: Yes   Plan:  continue heparin IV infusion at 1050 units/hr Daily heparin level and CBC  Eudelia Bunch, Pharm.D 12/28/2020 8:59 PM

## 2020-12-28 NOTE — Progress Notes (Signed)
ANTICOAGULATION CONSULT NOTE   Pharmacy Consult for heparin Indication: DVT  No Known Allergies  Patient Measurements: Height: 6\' 1"  (185.4 cm) Weight: 102.1 kg (225 lb) IBW/kg (Calculated) : 79.9TBW 102.1 kg Heparin Dosing Weight: ~100 kg  Vital Signs: Temp: 97.9 F (36.6 C) (01/04 1955) Temp Source: Oral (01/04 1955) BP: 184/94 (01/04 1955) Pulse Rate: 66 (01/04 1955)  Labs: Recent Labs    12/27/20 0318 12/27/20 1426 12/27/20 2229  HGB 12.2* 12.2*  --   HCT 41.4 41.6  --   PLT 247 260  --   LABPROT 14.1  --   --   INR 1.1  --   --   HEPARINUNFRC  --   --  1.20*  CREATININE 3.39* 2.59*  --     Estimated Creatinine Clearance: 26.2 mL/min (A) (by C-G formula based on SCr of 2.59 mg/dL (H)).   Medical History: Past Medical History:  Diagnosis Date  . Cancer [facility])    prostate  . Gallstones   . Hypertension   . Stone, kidney   . Stone, kidney     Assessment: 87 yo M presents with complaint of blood clot. Was seen at [facility] earlier today and given a dose of enoxaparin around 3am. Plan to admit and may need IR consult. Will plan no heparin bolus due to recent enoxaparin in the setting of AKI. Hgb 12.2, plts wnl.   Heparin level = 1.2 (supratherapeutic) with heparin gtt @ 1700 units/hr  No complications of therapy or line issues per RN  Goal of Therapy:  Heparin level 0.3-0.7 units/ml Monitor platelets by anticoagulation protocol: Yes   Plan:  Hold heparin x 1 hour then resume heparin gtt @ reduced reate of 1450 units/hr Check 8 hr heparin level Monitor daily heparin level, CBC, s/s of bleed  [name], PharmD 12/28/2020 12:01 AM

## 2020-12-28 NOTE — Progress Notes (Signed)
ANTICOAGULATION CONSULT NOTE - Follow Up Consult  Pharmacy Consult for Heparin Indication: DVT  No Known Allergies  Patient Measurements: Height: 6\' 1"  (185.4 cm) Weight: 102.1 kg (225 lb) IBW/kg (Calculated) : 79.9 Heparin Dosing Weight: 100kg  Vital Signs: Temp: 97.9 F (36.6 C) (01/05 0618) Temp Source: Oral (01/05 0618) BP: 149/83 (01/05 0618) Pulse Rate: 67 (01/05 0618)  Labs: Recent Labs    12/27/20 0318 12/27/20 1426 12/27/20 2229 12/28/20 0446  HGB 12.2* 12.2*  --  10.6*  HCT 41.4 41.6  --  35.9*  PLT 247 260  --  214  LABPROT 14.1  --   --   --   INR 1.1  --   --   --   HEPARINUNFRC  --   --  1.20* 1.08*  CREATININE 3.39* 2.59*  --  2.53*    Estimated Creatinine Clearance: 26.8 mL/min (A) (by C-G formula based on SCr of 2.53 mg/dL (H)).   Medications:  Scheduled:  . amLODipine  10 mg Oral Daily  . donepezil  10 mg Oral QHS  . mirabegron ER  50 mg Oral Daily  . QUEtiapine  50 mg Oral BID   Infusions:  . heparin 1,450 Units/hr (12/28/20 0115)    Assessment: 34 yoM admitted with acute DVT and AKI; pharmacy is consulted to dose Heparin IV.  He was initially seen at [facility] on 12/28/19 and given a dose of enoxaparin around 3am and an outpatient prescription for Xarelto.  Admitted for IR consult. Will plan no heparin bolus due to recent enoxaparin in the setting of AKI.   Today, 12/28/2020: Heparin level 1.22, remains elevated despite previous hold and rate decrease - suspect elevated level related to Lovenox dose 1/4 with AKI. SCr elevated, but improved to 2.5 CBC: Hgb decreased to 10.6, Plt WNL No bleeding or complications reported per RN.  Goal of Therapy:  Heparin level 0.3-0.7 units/ml Monitor platelets by anticoagulation protocol: Yes   Plan:  Hold heparin x1 hour, then decrease to heparin IV infusion at 1050 units/hr Heparin level 8 hours after rate change Daily heparin level and CBC   [name] PharmD, BCPS Clinical Pharmacist [facility] main  pharmacy [phone] 12/28/2020 7:22 AM

## 2020-12-28 NOTE — Progress Notes (Signed)
PROGRESS NOTE    [name] [name]  ZOX:[id number] DOB: September 09, 1933 DOA: 12/27/2020 PCP: [facility]    Chief Complaint  Patient presents with  . blood clot    Brief Narrative:  prostate cancer, dementia, hypertension, CKD 4, nephrolithiasis who presented to the ED with left lower extremity swelling that started 12/25/2020.  He initially presented to [place] in the evening of 1/3 and was found to have an elevated D-dimer of >20 concerning for DVT.  Unfortunately, ultrasound was unavailable to perform the study at that point in time and so he was given a shot of Lovenox, discharged with Xarelto and told to follow-up with [facility] radiology on January 4, unfortunately ultrasound showed extensive DVT left lower extremity, patient was referred to ED, EDP discussed with IR who recommend admission and IR consultation  Subjective:  He is currently on heparin drip, denies pain He is very lethargic in the morning, in the afternoon he was awake and pleasantly confused when the wife is at the bedside  Assessment & Plan:   Principal Problem:   DVT (deep venous thrombosis) ([place]) Active Problems:   HTN (hypertension)   CKD (chronic kidney disease) stage 4, GFR 15-29 ml/min (HCC)   Acute kidney injury superimposed on chronic kidney disease ([place])   Extensive acute DVT left lower extremity -Risk factor including immobility, he does have history of prostate cancer, per chart review PSA has been unremarkable in 2021, will repeat PSA -Continue heparin drip -Per IR currently there is no evidence of phlegmasia cerulea dolens ,patient is not a candidate for lytics or embolectomy due to overall frailty and renal impairment, IR recommended anticoagulation and follow-up with IR in 2 weeks for reevaluation, they may consider IVC filter placement if patient is not able to tolerate anticoagulation -IR also agrees to palliative care consult  AKI on CKD 4 -Creatinine on presentation 3.39, this morning  2.53, baseline around 2.1 -will get UA and urine culture, did have leukocytosis initially on presentation, patient's not able to provide history -will do bladder scan, renal ultrasound -Renal dosing meds   Noninsulin-dependent type 2 diabetes, controlled, A1c 5.8 Home oral meds held, diet control here  Hypertension Continue Norvasc  Dementia Per wife patient currently is at baseline Continue Aricept and Seroquel  Failure to thrive: will get physical therapy, likely will need skilled nursing facility placement, wife agrees to it Wife also agreed with palliative care consult  DVT prophylaxis: On heparin drip   Code Status: Full, wife was going to talk to patient and palliative care further regarding CODE STATUS Family Communication: Wife at the bedside Disposition:   Status is: Inpatient  Dispo: The patient is from: Home              Anticipated d/c is to: Skilled nursing facility placement              Anticipated d/c date is: 24 to 48 hours                Consultants:   IR  Palliative care  Procedures:   None  Antimicrobials:   None     Objective: Vitals:   12/27/20 1440 12/27/20 1441 12/27/20 1500 12/27/20 1955  BP:   (!) 147/94 (!) 184/94  Pulse: 67  (!) 55 66  Resp: 19  19 16   Temp:    97.9 F (36.6 C)  TempSrc:    Oral  SpO2: 97%  96% 98%  Weight:  102.1 kg  Height:  6\' 1"  (1.854 m)      Intake/Output Summary (Last 24 hours) at 12/28/2020 1706 Last data filed at 12/28/2020 1000 Gross per 24 hour  Intake 756.42 ml  Output 600 ml  Net 156.42 ml   Filed Weights   12/27/20 1411 12/27/20 1441  Weight: 102.1 kg 102.1 kg    Examination:  General exam: calm, NAD, pleasantly demented, some resting tremor Respiratory system: Clear to auscultation. Respiratory effort normal. Cardiovascular system: S1 & S2 heard, RRR.  No pedal edema. Gastrointestinal system: Abdomen is nondistended, soft and nontender.  Normal bowel sounds heard. Central nervous  system: Alert and oriented to person. No focal neurological deficits. Extremities: Left lower extremity edema Skin: No rashes, lesions or ulcers Psychiatry: Pleasantly demented    Data Reviewed: I have personally reviewed following labs and imaging studies  CBC: Recent Labs  Lab 12/27/20 0318 12/27/20 1426 12/28/20 0446  WBC 12.6* 11.1* 9.7  NEUTROABS 9.5* 8.4*  --   HGB 12.2* 12.2* 10.6*  HCT 41.4 41.6 35.9*  MCV 75.4* 75.4* 76.1*  PLT 247 260 448    Basic Metabolic Panel: Recent Labs  Lab 12/27/20 0318 12/27/20 1426 12/28/20 0446  NA 143 144 145  K 4.5 4.7 4.6  CL 104 106 109  CO2 26 28 26   GLUCOSE 185* 131* 128*  BUN 40* 41* 35*  CREATININE 3.39* 2.59* 2.53*  CALCIUM 9.2 9.7 9.0    GFR: Estimated Creatinine Clearance: 26.8 mL/min (A) (by C-G formula based on SCr of 2.53 mg/dL (H)).  Liver Function Tests: No results for input(s): AST, ALT, ALKPHOS, BILITOT, PROT, ALBUMIN in the last 168 hours.  CBG: No results for input(s): GLUCAP in the last 168 hours.   Recent Results (from the past 240 hour(s))  Resp Panel by RT-PCR (Flu A&B, Covid) Nasopharyngeal Swab     Status: None   Collection Time: 12/27/20  2:27 PM   Specimen: Nasopharyngeal Swab; Nasopharyngeal(NP) swabs in vial transport medium  Result Value Ref Range Status   SARS Coronavirus 2 by RT PCR NEGATIVE NEGATIVE Final    Comment: (NOTE) SARS-CoV-2 target nucleic acids are NOT DETECTED.  The SARS-CoV-2 RNA is generally detectable in upper respiratory specimens during the acute phase of infection. The lowest concentration of SARS-CoV-2 viral copies this assay can detect is 138 copies/mL. A negative result does not preclude SARS-Cov-2 infection and should not be used as the sole basis for treatment or other patient management decisions. A negative result may occur with  improper specimen collection/handling, submission of specimen other than nasopharyngeal swab, presence of viral mutation(s)  within the areas targeted by this assay, and inadequate number of viral copies(<138 copies/mL). A negative result must be combined with clinical observations, patient history, and epidemiological information. The expected result is Negative.  Fact Sheet for Patients:  EntrepreneurPulse.com.au  Fact Sheet for Healthcare Providers:  IncredibleEmployment.be  This test is no t yet approved or cleared by the [place] FDA and  has been authorized for detection and/or diagnosis of SARS-CoV-2 by FDA under an Emergency Use Authorization (EUA). This EUA will remain  in effect (meaning this test can be used) for the duration of the COVID-19 declaration under Section 564(b)(1) of the Act, 21 U.S.C.section 360bbb-3(b)(1), unless the authorization is terminated  or revoked sooner.       Influenza A by PCR NEGATIVE NEGATIVE Final   Influenza B by PCR NEGATIVE NEGATIVE Final    Comment: (NOTE) The Xpert Xpress SARS-CoV-2/FLU/RSV plus assay is intended  as an aid in the diagnosis of influenza from Nasopharyngeal swab specimens and should not be used as a sole basis for treatment. Nasal washings and aspirates are unacceptable for Xpert Xpress SARS-CoV-2/FLU/RSV testing.  Fact Sheet for Patients: EntrepreneurPulse.com.au  Fact Sheet for Healthcare Providers: IncredibleEmployment.be  This test is not yet approved or cleared by the [place] FDA and has been authorized for detection and/or diagnosis of SARS-CoV-2 by FDA under an Emergency Use Authorization (EUA). This EUA will remain in effect (meaning this test can be used) for the duration of the COVID-19 declaration under Section 564(b)(1) of the Act, 21 U.S.C. section 360bbb-3(b)(1), unless the authorization is terminated or revoked.  Performed at [facility], [place] [address], [place], [place] [address]          Radiology Studies: MR  ANGIO PELVIS WO CONTRAST  Result Date: 12/27/2020 CLINICAL DATA:  Deep venous thrombosis EXAM: MRA ABDOMEN AND PELVIS WITHOUT CONTRAST TECHNIQUE: Multiplanar, multiecho pulse sequences of the abdomen and pelvis were obtained without intravenous contrast. Angiographic images of abdomen and pelvis were obtained using MRA technique without intravenous contrast. CONTRAST:  None COMPARISON:  None. FINDINGS: MRA ABDOMEN FINDINGS The examination is limited by motion artifact. Visualized lower extremity arterial inflow and outflow is widely patent. Internal iliac arteries are patent bilaterally. There is T1 peripheral hyperintense, T2 hypointense expansile soft tissue within the left common femoral vein, as well as the proximal and mid femoral vein in keeping with acute DVT. The left external iliac vein and common iliac veins are patent. The inferior vena cava is patent. On the right, the iliofemoral venous system is patent. MRI PELVIS FINDINGS Normal marrow signal activity. Visualized bowel is unremarkable. Prostate gland absent. Bladder is unremarkable. Rectum is unremarkable. No pathologic adenopathy within the pelvis. Numerous simple cortical cysts are seen within the kidneys bilaterally, the largest seen on the right and incompletely visualized but measuring at least 9.9 cm in greatest dimension. Superimposed bilateral renal cortical atrophy, right greater than left. Cholelithiasis noted. Extensive subcutaneous edema within the visualized left lower extremity IMPRESSION: Extensive acute DVT within the left common femoral vein and visualized central femoral vein. No ileo caval DVT. Extensive subcutaneous edema involving the visualized proximal left lower extremity. Cholelithiasis without superimposed inflammatory change. Renal cortical atrophy with superimposed innumerable renal cortical cysts identified. Electronically Signed   By: Fidela Salisbury MD   On: 12/27/2020 22:39   MR ANGIO ABDOMEN WO CONTRAST  Result  Date: 12/27/2020 CLINICAL DATA:  Deep venous thrombosis EXAM: MRA ABDOMEN AND PELVIS WITHOUT CONTRAST TECHNIQUE: Multiplanar, multiecho pulse sequences of the abdomen and pelvis were obtained without intravenous contrast. Angiographic images of abdomen and pelvis were obtained using MRA technique without intravenous contrast. CONTRAST:  None COMPARISON:  None. FINDINGS: MRA ABDOMEN FINDINGS The examination is limited by motion artifact. Visualized lower extremity arterial inflow and outflow is widely patent. Internal iliac arteries are patent bilaterally. There is T1 peripheral hyperintense, T2 hypointense expansile soft tissue within the left common femoral vein, as well as the proximal and mid femoral vein in keeping with acute DVT. The left external iliac vein and common iliac veins are patent. The inferior vena cava is patent. On the right, the iliofemoral venous system is patent. MRI PELVIS FINDINGS Normal marrow signal activity. Visualized bowel is unremarkable. Prostate gland absent. Bladder is unremarkable. Rectum is unremarkable. No pathologic adenopathy within the pelvis. Numerous simple cortical cysts are seen within the kidneys bilaterally, the largest seen on the right and incompletely  visualized but measuring at least 9.9 cm in greatest dimension. Superimposed bilateral renal cortical atrophy, right greater than left. Cholelithiasis noted. Extensive subcutaneous edema within the visualized left lower extremity IMPRESSION: Extensive acute DVT within the left common femoral vein and visualized central femoral vein. No ileo caval DVT. Extensive subcutaneous edema involving the visualized proximal left lower extremity. Cholelithiasis without superimposed inflammatory change. Renal cortical atrophy with superimposed innumerable renal cortical cysts identified. Electronically Signed   By: Fidela Salisbury MD   On: 12/27/2020 22:39   VAS [place] LOWER EXTREMITY VENOUS (DVT)  Result Date: 12/27/2020  Lower Venous  DVT Study Indications: Edema.  Comparison Study: No previous exam. Performing Technologist: Vonzell Schlatter RVT  Examination Guidelines: A complete evaluation includes B-mode imaging, spectral Doppler, color Doppler, and power Doppler as needed of all accessible portions of each vessel. Bilateral testing is considered an integral part of a complete examination. Limited examinations for reoccurring indications may be performed as noted. The reflux portion of the exam is performed with the patient in reverse Trendelenburg.  +-----+---------------+---------+-----------+----------+--------------+ RIGHTCompressibilityPhasicitySpontaneityPropertiesThrombus Aging +-----+---------------+---------+-----------+----------+--------------+ CFV  Full           Yes      Yes                                 +-----+---------------+---------+-----------+----------+--------------+ SFJ  Full                                                        +-----+---------------+---------+-----------+----------+--------------+   +---------+---------------+---------+-----------+----------+--------------+ LEFT     CompressibilityPhasicitySpontaneityPropertiesThrombus Aging +---------+---------------+---------+-----------+----------+--------------+ CFV      None           No       No                                  +---------+---------------+---------+-----------+----------+--------------+ SFJ      None                                                        +---------+---------------+---------+-----------+----------+--------------+ FV Prox  None                                                        +---------+---------------+---------+-----------+----------+--------------+ FV Mid   None                                                        +---------+---------------+---------+-----------+----------+--------------+ FV DistalNone                                                         +---------+---------------+---------+-----------+----------+--------------+  PFV      None                                                        +---------+---------------+---------+-----------+----------+--------------+ POP      None           No       No                                  +---------+---------------+---------+-----------+----------+--------------+ PTV      None                                                        +---------+---------------+---------+-----------+----------+--------------+ PERO                                                  Not visualized +---------+---------------+---------+-----------+----------+--------------+ Gastroc  None                                                        +---------+---------------+---------+-----------+----------+--------------+   Left Technical Findings: Not visualized segments include Peroneal veins.   Summary: RIGHT: - No evidence of deep vein thrombosis in the lower extremity. No indirect evidence of obstruction proximal to the inguinal ligament.  LEFT: - Findings consistent with acute deep vein thrombosis involving the left common femoral vein, SF junction, left femoral vein, left proximal profunda vein, left popliteal vein, left posterior tibial veins, and left gastrocnemius veins. - Acute DVT in left EIV.  *See table(s) above for measurements and observations. Electronically signed by Curt Jews MD on 12/27/2020 at 1:59:41 PM.    Final         Scheduled Meds: . amLODipine  10 mg Oral Daily  . donepezil  10 mg Oral QHS  . mirabegron ER  50 mg Oral Daily  . QUEtiapine  50 mg Oral BID   Continuous Infusions: . heparin 1,050 Units/hr (12/28/20 1303)     LOS: 1 day   Time spent: 25 mins, case discussed with interventional radiology Greater than 50% of this time was spent in counseling, explanation of diagnosis, planning of further management, and coordination of care.  I have personally reviewed and  interpreted on  12/28/2020 daily labs, I reviewed all nursing notes, pharmacy notes, consultant notes,  vitals, pertinent old records  I have discussed plan of care as described above with RN , patient and family on 12/28/2020  Voice Recognition /Dragon dictation system was used to create this note, attempts have been made to correct errors. Please contact the author with questions and/or clarifications.   Florencia Reasons, MD PhD FACP [facility]  Available via Epic secure chat 7am-7pm for nonurgent issues Please page for urgent issues To page the attending provider between 7A-7P or the covering provider during after hours 7P-7A,  please log into the web site [email or web address] and access using universal [place] password for that web site. If you do not have the password, please call the hospital operator.    12/28/2020, 5:06 PM

## 2020-12-29 DIAGNOSIS — R7989 Other specified abnormal findings of blood chemistry: Secondary | ICD-10-CM | POA: Diagnosis not present

## 2020-12-29 DIAGNOSIS — I10 Essential (primary) hypertension: Secondary | ICD-10-CM

## 2020-12-29 DIAGNOSIS — F0150 Vascular dementia without behavioral disturbance: Secondary | ICD-10-CM | POA: Diagnosis not present

## 2020-12-29 DIAGNOSIS — N184 Chronic kidney disease, stage 4 (severe): Secondary | ICD-10-CM

## 2020-12-29 DIAGNOSIS — N179 Acute kidney failure, unspecified: Secondary | ICD-10-CM | POA: Diagnosis not present

## 2020-12-29 DIAGNOSIS — N189 Chronic kidney disease, unspecified: Secondary | ICD-10-CM | POA: Diagnosis not present

## 2020-12-29 DIAGNOSIS — I82402 Acute embolism and thrombosis of unspecified deep veins of left lower extremity: Secondary | ICD-10-CM | POA: Diagnosis not present

## 2020-12-29 DIAGNOSIS — Z7189 Other specified counseling: Secondary | ICD-10-CM

## 2020-12-29 LAB — BASIC METABOLIC PANEL
Anion gap: 9 (ref 5–15)
BUN: 31 mg/dL — ABNORMAL HIGH (ref 8–23)
CO2: 23 mmol/L (ref 22–32)
Calcium: 8.8 mg/dL — ABNORMAL LOW (ref 8.9–10.3)
Chloride: 106 mmol/L (ref 98–111)
Creatinine, Ser: 2.28 mg/dL — ABNORMAL HIGH (ref 0.61–1.24)
GFR, Estimated: 27 mL/min — ABNORMAL LOW (ref >60)
Glucose, Bld: 122 mg/dL — ABNORMAL HIGH (ref 70–99)
Potassium: 4.4 mmol/L (ref 3.5–5.1)
Sodium: 138 mmol/L (ref 135–145)

## 2020-12-29 LAB — URINALYSIS, ROUTINE W REFLEX MICROSCOPIC
Bilirubin Urine: NEGATIVE
Glucose, UA: NEGATIVE mg/dL
Hgb urine dipstick: NEGATIVE
Ketones, ur: NEGATIVE mg/dL
Leukocytes,Ua: NEGATIVE
Nitrite: NEGATIVE
Protein, ur: NEGATIVE mg/dL
Specific Gravity, Urine: 1.013 (ref 1.005–1.030)
pH: 6 (ref 5.0–8.0)

## 2020-12-29 LAB — HEPARIN LEVEL (UNFRACTIONATED)
Heparin Unfractionated: 0.7 IU/mL (ref 0.30–0.70)
Heparin Unfractionated: 0.39 IU/mL (ref 0.30–0.70)

## 2020-12-29 LAB — PSA: Prostatic Specific Antigen: 4.01 ng/mL — ABNORMAL HIGH (ref 0.00–4.00)

## 2020-12-29 MED ORDER — POLYETHYLENE GLYCOL 3350 17 G PO PACK
17.0000 g | PACK | Freq: Every day | ORAL | Status: DC
  Administered 2020-12-29 – 2020-12-30 (×2): 17 g via ORAL
  Filled 2020-12-29 (×2): qty 1

## 2020-12-29 MED ORDER — SENNOSIDES-DOCUSATE SODIUM 8.6-50 MG PO TABS
1.0000 | ORAL_TABLET | Freq: Two times a day (BID) | ORAL | Status: DC
  Administered 2020-12-29 – 2020-12-30 (×3): 1 via ORAL
  Filled 2020-12-29 (×3): qty 1

## 2020-12-29 NOTE — Progress Notes (Signed)
Pharmacy - IV heparin  Assessment:    Please see note from Ulice Dash, PharmD earlier today for full details.  Briefly, 87 y.o. male on IV heparin for acute DVT in setting of AKI.   Most recent heparin level therapeutic at 0.39 on 1000 units/hr; significantly lower than previous levels, but suspect this is d/t improving renal function  No bleeding or infusion issues per RN  Plan:   Continue heparin 1000 units/hr  Daily CBC and heparin level  [name], PharmD, BCPS [phone] 12/29/2020, 1:51 PM

## 2020-12-29 NOTE — Evaluation (Signed)
Physical Therapy Evaluation Patient Details Name: [name] [name] MRN: [id number] DOB: 10/01/1933 Today's Date: 12/29/2020   History of Present Illness  Pt is an 87 year old male with PMHx significant for prostate cancer, dementia, hypertension, CKD 4, nephrolithiasis who presented to the ED with left lower extremity swelling that started 12/25/2020.  He initially presented to [place] in the evening of 1/3 and was found to have an elevated D-dimer of >20 concerning for DVT.  Unfortunately, ultrasound was unavailable to perform the study at that point in time and so he was given a shot of Lovenox, discharged with Xarelto and told to follow-up with [facility] radiology on January 4, unfortunately ultrasound showed extensive DVT left lower extremity, patient was referred to ED, EDP discussed with IR who recommend admission and IR consultation  Clinical Impression  Pt admitted with above diagnosis. Pt currently with functional limitations due to the deficits listed below (see PT Problem List). Pt will benefit from skilled PT to increase their independence and safety with mobility to allow discharge to the venue listed below.  Pt only orientated to self and agreeable for OOB to recliner.   Pt lives with spouse per chart and would benefit from SNF upon d/c.      Follow Up Recommendations SNF    Equipment Recommendations  None recommended by PT    Recommendations for Other Services       Precautions / Restrictions Precautions Precautions: Fall      Mobility  Bed Mobility Overal bed mobility: Needs Assistance Bed Mobility: Supine to Sit     Supine to sit: HOB elevated;Min guard     General bed mobility comments: required functional cues for mobilizing to EOB    Transfers Overall transfer level: Needs assistance Equipment used: Rolling walker (2 wheeled) Transfers: Sit to/from [employer] Transfers Sit to Stand: Min assist;From elevated surface Stand pivot transfers: Min  assist       General transfer comment: assist to rise and steady, multimodal cues for technique  Ambulation/Gait             General Gait Details: pt declined today however agreeable to transfer to recliner  Stairs            Wheelchair Mobility    Modified Rankin (Stroke Patients Only)       Balance Overall balance assessment: Needs assistance         Standing balance support: Bilateral upper extremity supported Standing balance-Leahy Scale: Poor Standing balance comment: reliant on UE support                             Pertinent Vitals/Pain Pain Assessment: No/denies pain    Home Living Family/patient expects to be discharged to:: Private residence Living Arrangements: Spouse/significant other   Type of Home: House Home Access: Stairs to enter   Technical brewer of Steps: 3 Home Layout: One level Home Equipment: Cane - single point Additional Comments: above per admission 2016 - pt poor historian    Prior Function Level of Independence: Needs assistance   Gait / Transfers Assistance Needed: pt reports using RW  ADL's / Homemaking Assistance Needed: likely requires set up and supervision        Hand Dominance        Extremity/Trunk Assessment        Lower Extremity Assessment Lower Extremity Assessment: Generalized weakness;LLE deficits/detail LLE Deficits / Details: edematous left LE, pt self assisting at  times       Communication   Communication: HOH  Cognition Arousal/Alertness: Awake/alert Behavior During Therapy: WFL for tasks assessed/performed Overall Cognitive Status: History of cognitive impairments - at baseline                                 General Comments: A x Ox1      General Comments      Exercises     Assessment/Plan    PT Assessment Patient needs continued PT services  PT Problem List Decreased strength;Decreased mobility;Decreased balance;Decreased knowledge of use  of DME;Decreased activity tolerance       PT Treatment Interventions DME instruction;Gait training;Balance training;Therapeutic exercise;Functional mobility training;Therapeutic activities;Patient/family education    PT Goals (Current goals can be found in the Care Plan section)  Acute Rehab PT Goals PT Goal Formulation: With patient Time For Goal Achievement: 01/12/21 Potential to Achieve Goals: Good    Frequency Min 2X/week   Barriers to discharge        Co-evaluation               AM-PAC PT "6 Clicks" Mobility  Outcome Measure Help needed turning from your back to your side while in a flat bed without using bedrails?: A Little Help needed moving from lying on your back to sitting on the side of a flat bed without using bedrails?: A Little Help needed moving to and from a bed to a chair (including a wheelchair)?: A Little Help needed standing up from a chair using your arms (e.g., wheelchair or bedside chair)?: A Little Help needed to walk in hospital room?: A Lot Help needed climbing 3-5 steps with a railing? : A Lot 6 Click Score: 16    End of Session Equipment Utilized During Treatment: Gait belt Activity Tolerance: Patient tolerated treatment well Patient left: in chair;with call bell/phone within reach;with chair alarm set Nurse Communication: Mobility status PT Visit Diagnosis: Difficulty in walking, not elsewhere classified (R26.2);Muscle weakness (generalized) (M62.81)    Time: 1000-1017 PT Time Calculation (min) (ACUTE ONLY): 17 min   Charges:   PT Evaluation $PT Eval Low Complexity: 1 Low        Kati PT, DPT Acute Rehabilitation Services Pager: [phone] Office: [phone]  Lailana Shira,KATHrine E 12/29/2020, 1:20 PM

## 2020-12-29 NOTE — Progress Notes (Signed)
ANTICOAGULATION CONSULT NOTE - Follow Up Consult  Pharmacy Consult for Heparin Indication: DVT  No Known Allergies  Patient Measurements: Height: 6\' 1"  (185.4 cm) Weight: 102.1 kg (225 lb) IBW/kg (Calculated) : 79.9 Heparin Dosing Weight: 100kg  Vital Signs: Temp: 98 F (36.7 C) (01/06 0507) Temp Source: Oral (01/06 0507) BP: 151/93 (01/06 0507) Pulse Rate: 60 (01/06 0507)  Labs: Recent Labs    12/27/20 0318 12/27/20 1426 12/27/20 2229 12/28/20 0446 12/28/20 0950 12/28/20 2028 12/29/20 0424  HGB 12.2* 12.2*  --  10.6*  --   --   --   HCT 41.4 41.6  --  35.9*  --   --   --   PLT 247 260  --  214  --   --   --   LABPROT 14.1  --   --   --   --   --   --   INR 1.1  --   --   --   --   --   --   HEPARINUNFRC  --   --    < > 1.08* 1.22* 0.60 0.70  CREATININE 3.39* 2.59*  --  2.53*  --   --  2.28*   < > = values in this interval not displayed.    Estimated Creatinine Clearance: 29.8 mL/min (A) (by C-G formula based on SCr of 2.28 mg/dL (H)).   Medications:  Scheduled:  . amLODipine  10 mg Oral Daily  . donepezil  10 mg Oral QHS  . QUEtiapine  50 mg Oral BID   Infusions:  . heparin 1,050 Units/hr (12/29/20 0600)    Assessment: 42 yoM admitted with acute DVT and AKI; pharmacy is consulted to dose Heparin IV.  He was initially seen at [facility] on 12/28/19 and given a dose of enoxaparin around 3am and an outpatient prescription for Xarelto.  Admitted for IR consult. Will plan no heparin bolus due to recent enoxaparin in the setting of AKI.   Today, 12/29/2020:  HL at high end of goal range at 0.7 with heparin infusion running at 1050 units/hr  Looks like daily CBC was ordered from [facility] but did not cross over upon transfer  SCr relatively stable  No bleeding or complications reported per RN   Goal of Therapy:  Heparin level 0.3-0.7 units/ml Monitor platelets by anticoagulation protocol: Yes   Plan:  Reorder daily CBC from tomorrow Decrease heparin infusion rate  slightly to 1000 units/hr Recheck HL in 8 hours F/U plans for transition to oral anticoagulation Daily heparin level and CBC  Ulice Dash D 12/29/2020 7:20 AM

## 2020-12-29 NOTE — Consult Note (Signed)
**Note [name]-Identified via Obfuscation** Consultation Note Date: 12/29/2020   Patient Name: [name] [name]  DOB: 02/19/1933  MRN: [id number]  Age / Sex: 87 y.o., male   PCP: [place] Referring Physician: Florencia Reasons, MD   REASON FOR CONSULTATION:Establishing goals of care  Palliative Care consult requested for goals of care discussion in this 87 y.o. male with multiple medical problems including prostate cancer, dementia, hypertension, CKD 4, and nephrolithiasis. Patient presented to ED after vascular study follow. He initially was seen at [facility] and found to have a D-dimer >20 concerning for DVT (Lovenox shot given) and discharged on Xarelto. During ED work-up doppler showed acute DVT involving the left common femoral vein, SF junction, femoral vein, proximal profunda vein, popliteal vein, posterior tibial vein and gastrocnemius veins.  Clinical Assessment and Goals of Care: I have reviewed medical records including lab results, imaging, Epic notes, and MAR, received report from the bedside RN, and assessed the patient. I spoke with patient's wife/HCPOA [name] [name] via phone to discuss diagnosis prognosis, [place], EOL wishes, disposition and options.  I introduced Palliative Medicine as specialized medical care for people living with serious illness. It focuses on providing relief from the symptoms and stress of a serious illness. The goal is to improve quality of life for both the patient and the family. Wife verbalized understanding and appreciation.   We discussed a brief life review of the patient, along with his functional and nutritional status. 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 They have been married since 2002 and have 4 daughters between each other. He enjoyed being around his family and friends in addition to serving his church family.   Prior to admission he resided in the home with his wife. She reports having caregivers in the home to  assist 5 days/week for 3 hours each time. He was also working with physical therapy in the home. He required some assistance with ambulating due to unsteady gait. Enjoyed riding in car for errands. His appetite was great with frequent snacking. Required assistance with ADLs and meals. Wife reports although he has dementia he could engage in appropriate discussions with her, answer some questions by others appropriately. However he was limited in his ability to express health care needs or pinpoint pain/discomfort.   We discussed His current illness and what it means in the larger context of His on-going co-morbidities. With specific discussions regarding his extensive DVT and overall functional state. Natural disease trajectory and expectations at EOL were discussed.  [name] [name] verbalizes understanding of updates and discussion. She expresses her understanding of patient's dementia process also which we discussed.   I attempted to elicit values and goals of care important to the patient.    The difference between aggressive medical intervention and comfort care was considered in light of the patient's goals of care. [name] [name] shares patient's quality of life has decreased some over the past year however feels he stills has somewhat of a good quality of life.   She states her goal is for patient to continue to receive aggressive treatments allowing every opportunity to improve. We discussed best case and worst case scenarios. She verbalized understanding and does not ever wish for him to suffer or be in incontrollable pain.   Advanced directives, concepts specific to code status, artifical feeding and hydration, and rehospitalization were considered and discussed. Patient does not have a documented advanced directive.  I discussed at length patient's  full code status with consideration of his current illness and co-morbidities. Extensive education with wife regarding what a code status would like for  patient and his potential trauma to patient. [name] [name] verbalized understanding and shares she does not think he or she would want that (sharing their experience with patient's mother). She states they had not discussed directives although she knows conversations would have been important. She would like to discuss with patient's daughters and states she wants to discuss with patient also before making a final decision. Education provided to wife, respectfully given patient's dementia diagnosis he would not be able to make a sound decision on his own and the final decision would need to come from her. She verbalized understanding but expressed she would still like to ask him privately to see how he responds. She also shares she does not feel he would ever want any forms of artificial feeding if required.   Hospice and Palliative Care services outpatient were explained and offered. Wife verbalized her understanding and awareness of both palliative and hospice's goals and philosophy of care. She is not prepared to accept hospice given goal to continue with treatment but is very much open to palliative support outpatient.   Questions and concerns addressed.The family was encouraged to call with questions or concerns.  PMT will continue to support holistically.   SOCIAL HISTORY:     reports that he quit smoking about 48 years ago. He has a 10.00 pack-year smoking history. He has never used smokeless tobacco. He reports that he does not drink alcohol and does not use drugs.  CODE STATUS: Full code  ADVANCE DIRECTIVES: Primary Decision Maker:  Wife    SYMPTOM MANAGEMENT: per attending   Palliative Prophylaxis:   Aspiration, Delirium Protocol, Frequent Pain Assessment and Oral Care  PSYCHO-SOCIAL/SPIRITUAL:  Support System: Family  Desire for further Chaplaincy support:No   Additional Recommendations (Limitations, Scope, Preferences):  continue to treat the treatable, ongoing code  discussion  Education on hospice/palliative    PAST MEDICAL HISTORY: Past Medical History:  Diagnosis Date  . Cancer [facility])    prostate  . Gallstones   . Hypertension   . Stone, kidney   . Stone, kidney     ALLERGIES:  has No Known Allergies.   MEDICATIONS:  Current Facility-Administered Medications  Medication Dose Route Frequency Provider Last Rate Last Admin  . acetaminophen (TYLENOL) tablet 650 mg  650 mg Oral Q6H PRN Harold Hedge, MD       Or  . acetaminophen (TYLENOL) suppository 650 mg  650 mg Rectal Q6H PRN Harold Hedge, MD      . amLODipine (NORVASC) tablet 10 mg  10 mg Oral Daily Harold Hedge, MD   10 mg at 12/29/20 0801  . donepezil (ARICEPT) tablet 10 mg  10 mg Oral QHS Harold Hedge, MD   10 mg at 12/28/20 2218  . furosemide (LASIX) tablet 20 mg  20 mg Oral Daily PRN Harold Hedge, MD      . heparin ADULT infusion 100 units/mL (25000 units/296mL)  1,000 Units/hr Intravenous Continuous Florencia Reasons, MD 10 mL/hr at 12/29/20 1129 1,000 Units/hr at 12/29/20 1129  . HYDROcodone-acetaminophen (NORCO/VICODIN) 5-325 MG per tablet 1-2 tablet  1-2 tablet Oral Q4H PRN Harold Hedge, MD      . LORazepam (ATIVAN) tablet 0.5 mg  0.5 mg Oral Q8H PRN Harold Hedge, MD      . polyethylene glycol (MIRALAX / GLYCOLAX) packet 17 g  17 g Oral Daily PRN Harold Hedge, MD      . polyethylene glycol (MIRALAX / GLYCOLAX) packet 17 g  17 g Oral Daily Florencia Reasons, MD   17 g at 12/29/20 1631  . QUEtiapine (SEROQUEL) tablet 50 mg  50 mg Oral BID Harold Hedge, MD   50 mg at 12/29/20 0801  . senna-docusate (Senokot-S) tablet 1 tablet  1 tablet Oral BID Florencia Reasons, MD        VITAL SIGNS: BP (!) 141/86 (BP Location: Left Arm)   Pulse 80   Temp 98.5 F (36.9 C) (Oral)   Resp 17   Ht 6\' 1"  (1.854 m)   Wt 102.1 kg   SpO2 98%   BMI 29.69 kg/m  Filed Weights   12/27/20 1411 12/27/20 1441  Weight: 102.1 kg 102.1 kg    Estimated body mass index is 29.69 kg/m as calculated from the  following:   Height as of this encounter: 6\' 1"  (1.854 m).   Weight as of this encounter: 102.1 kg.  LABS: CBC:    Component Value Date/Time   WBC 9.7 12/28/2020 0446   HGB 10.6 (L) 12/28/2020 0446   HCT 35.9 (L) 12/28/2020 0446   PLT 214 12/28/2020 0446   Comprehensive Metabolic Panel:    Component Value Date/Time   NA 138 12/29/2020 0424   K 4.4 12/29/2020 0424   BUN 31 (H) 12/29/2020 0424   CREATININE 2.28 (H) 12/29/2020 0424   ALBUMIN 3.2 (L) 08/31/2020 1932     Prognosis: Guarded  Discharge Planning:  [place] for rehab with Palliative care service follow-up per wife.   Recommendations: . Full Code-as confirmed by wife. Discussed at length. She would like to continue ongoing discussions with daughters/patient and follow-up tomorrow.  . Continue with current plan of care per medical team  . Patient does not have an Advanced Directive. Wife reports she does not think patient would want any forms of artificial feeding if ever needed.  . Wife requesting outpatient Palliative support and hopeful for SNF placement.  Marland Kitchen PMT will continue to support and follow. Please call team line with urgent needs.   Palliative Performance Scale:                Wife expressed understanding and was in agreement with this plan.   Thank you for allowing the Palliative Medicine Team to assist in the care of this patient.  Time In: 1600 Time Out: 1705 Time Total: 65 min.   Visit consisted of counseling and education dealing with the complex and emotionally intense issues of symptom management and palliative care in the setting of serious and potentially life-threatening illness.Greater than 50%  of this time was spent counseling and coordinating care related to the above assessment and plan.  Signed by:  Alda Lea, AGPCNP-BC Palliative Medicine Team  Phone: [phone] Pager: [phone] Amion: Bjorn Pippin

## 2020-12-29 NOTE — Plan of Care (Signed)
  Problem: Education: Goal: Knowledge of General Education information will improve Description: Including pain rating scale, medication(s)/side effects and non-pharmacologic comfort measures Outcome: Progressing   Problem: Clinical Measurements: Goal: Diagnostic test results will improve Outcome: Progressing   Problem: Consults Goal: Venous Thromboembolism Patient Education Description: See Patient Education Module for education specifics. Outcome: Progressing   Problem: Consults Goal: Diagnosis - Venous Thromboembolism (VTE) Description: Choose a selection Outcome: Progressing

## 2020-12-29 NOTE — Progress Notes (Signed)
PROGRESS NOTE    [name] [name]  YHC:[id number] DOB: 09-22-1933 DOA: 12/27/2020 PCP: [facility]    Chief Complaint  Patient presents with  . blood clot    Brief Narrative:  prostate cancer, dementia, hypertension, CKD 4, nephrolithiasis who presented to the ED with left lower extremity swelling that started 12/25/2020.  He initially presented to [facility] in the evening of 1/3 and was found to have an elevated D-dimer of >20 concerning for DVT.  Unfortunately, ultrasound was unavailable to perform the study at that point in time and so he was given a shot of Lovenox, discharged with Xarelto and told to follow-up with [facility] radiology on January 4, unfortunately ultrasound showed extensive DVT left lower extremity, patient was referred to ED, EDP discussed with IR who recommend admission and IR consultation  Subjective:  He is currently on heparin drip, denies pain He is awake and interactive this am, pleasantly demented Cr improving Wife reports patient had no BM for 4 days  Assessment & Plan:   Principal Problem:   DVT (deep venous thrombosis) (HCC) Active Problems:   HTN (hypertension)   CKD (chronic kidney disease) stage 4, GFR 15-29 ml/min (HCC)   Acute kidney injury superimposed on chronic kidney disease ([place])   Extensive acute DVT left lower extremity -Risk factor including immobility, he does have history of prostate cancer,  PSA 4.01, - -Per IR currently there is no evidence of phlegmasia cerulea dolens ,patient is not a candidate for lytics or embolectomy due to overall frailty and renal impairment, IR recommended anticoagulation and follow-up with IR in 2 weeks for reevaluation, they may consider IVC filter placement if patient is not able to tolerate anticoagulation -IR also agrees to palliative care consult -Continue heparin drip, plan to change to eliquis tomorrow, left leg appear improving  AKI on CKD 4 -Creatinine on presentation 3.39, this morning  2.28, baseline around 2.1 -clean UA   -bladder scan 0, renal ultrasound no hydronephrosis -Renal dosing meds   Noninsulin-dependent type 2 diabetes, controlled, A1c 5.8 Home oral meds held, diet control here  Hypertension Continue Norvasc  Dementia Per wife patient currently is at baseline Continue Aricept and Seroquel  Constipation: Start stool softener  Failure to thrive:  physical therapy recommended skilled nursing facility placement, wife agrees to it Wife also agreed to palliative care consult  DVT prophylaxis: On heparin drip   Code Status: Full, wife was going to talk to patient and palliative care further regarding CODE STATUS Family Communication: Wife over the phone today Disposition:   Status is: Inpatient  Dispo: The patient is from: Home              Anticipated d/c is to: Skilled nursing facility placement with palliative care following              Anticipated d/c date is: 24 to 48 hours                Consultants:   IR  Palliative care  Procedures:   None  Antimicrobials:   None     Objective: Vitals:   12/27/20 1500 12/27/20 1955 12/28/20 2212 12/29/20 0507  BP: (!) 147/94 (!) 184/94 (!) 155/96 (!) 151/93  Pulse: (!) 55 66 63 60  Resp: 19 16 18 16   Temp:  97.9 F (36.6 C) 98.1 F (36.7 C) 98 F (36.7 C)  TempSrc:  Oral Oral Oral  SpO2: 96% 98% 99% 98%  Weight:  Height:        Intake/Output Summary (Last 24 hours) at 12/29/2020 0759 Last data filed at 12/29/2020 0600 Gross per 24 hour  Intake 1003.51 ml  Output 1000 ml  Net 3.51 ml   Filed Weights   12/27/20 1411 12/27/20 1441  Weight: 102.1 kg 102.1 kg    Examination:  General exam: calm, NAD, pleasantly demented, some resting tremor Respiratory system: Clear to auscultation. Respiratory effort normal. Cardiovascular system: S1 & S2 heard, RRR.  No pedal edema. Gastrointestinal system: Abdomen is nondistended, soft and nontender.  Normal bowel sounds heard. Central  nervous system: Alert and oriented to person. No focal neurological deficits. Extremities: Left lower extremity edema has improved some Skin: No rashes, lesions or ulcers Psychiatry: Pleasantly demented    Data Reviewed: I have personally reviewed following labs and imaging studies  CBC: Recent Labs  Lab 12/27/20 0318 12/27/20 1426 12/28/20 0446  WBC 12.6* 11.1* 9.7  NEUTROABS 9.5* 8.4*  --   HGB 12.2* 12.2* 10.6*  HCT 41.4 41.6 35.9*  MCV 75.4* 75.4* 76.1*  PLT 247 260 182    Basic Metabolic Panel: Recent Labs  Lab 12/27/20 0318 12/27/20 1426 12/28/20 0446 12/29/20 0424  NA 143 144 145 138  K 4.5 4.7 4.6 4.4  CL 104 106 109 106  CO2 26 28 26 23   GLUCOSE 185* 131* 128* 122*  BUN 40* 41* 35* 31*  CREATININE 3.39* 2.59* 2.53* 2.28*  CALCIUM 9.2 9.7 9.0 8.8*    GFR: Estimated Creatinine Clearance: 29.8 mL/min (A) (by C-G formula based on SCr of 2.28 mg/dL (H)).  Liver Function Tests: No results for input(s): AST, ALT, ALKPHOS, BILITOT, PROT, ALBUMIN in the last 168 hours.  CBG: No results for input(s): GLUCAP in the last 168 hours.   Recent Results (from the past 240 hour(s))  Resp Panel by RT-PCR (Flu A&B, Covid) Nasopharyngeal Swab     Status: None   Collection Time: 12/27/20  2:27 PM   Specimen: Nasopharyngeal Swab; Nasopharyngeal(NP) swabs in vial transport medium  Result Value Ref Range Status   SARS Coronavirus 2 by RT PCR NEGATIVE NEGATIVE Final    Comment: (NOTE) SARS-CoV-2 target nucleic acids are NOT DETECTED.  The SARS-CoV-2 RNA is generally detectable in upper respiratory specimens during the acute phase of infection. The lowest concentration of SARS-CoV-2 viral copies this assay can detect is 138 copies/mL. A negative result does not preclude SARS-Cov-2 infection and should not be used as the sole basis for treatment or other patient management decisions. A negative result may occur with  improper specimen collection/handling, submission of  specimen other than nasopharyngeal swab, presence of viral mutation(s) within the areas targeted by this assay, and inadequate number of viral copies(<138 copies/mL). A negative result must be combined with clinical observations, patient history, and epidemiological information. The expected result is Negative.  Fact Sheet for Patients:  EntrepreneurPulse.com.au  Fact Sheet for Healthcare Providers:  IncredibleEmployment.be  This test is no t yet approved or cleared by the [place] FDA and  has been authorized for detection and/or diagnosis of SARS-CoV-2 by FDA under an Emergency Use Authorization (EUA). This EUA will remain  in effect (meaning this test can be used) for the duration of the COVID-19 declaration under Section 564(b)(1) of the Act, 21 U.S.C.section 360bbb-3(b)(1), unless the authorization is terminated  or revoked sooner.       Influenza A by PCR NEGATIVE NEGATIVE Final   Influenza B by PCR NEGATIVE NEGATIVE Final  Comment: (NOTE) The Xpert Xpress SARS-CoV-2/FLU/RSV plus assay is intended as an aid in the diagnosis of influenza from Nasopharyngeal swab specimens and should not be used as a sole basis for treatment. Nasal washings and aspirates are unacceptable for Xpert Xpress SARS-CoV-2/FLU/RSV testing.  Fact Sheet for Patients: EntrepreneurPulse.com.au  Fact Sheet for Healthcare Providers: IncredibleEmployment.be  This test is not yet approved or cleared by the [place] FDA and has been authorized for detection and/or diagnosis of SARS-CoV-2 by FDA under an Emergency Use Authorization (EUA). This EUA will remain in effect (meaning this test can be used) for the duration of the COVID-19 declaration under Section 564(b)(1) of the Act, 21 U.S.C. section 360bbb-3(b)(1), unless the authorization is terminated or revoked.  Performed at [facility], [place]  [address] [place], [place] [address]          Radiology Studies: MR ANGIO PELVIS WO CONTRAST  Result Date: 12/27/2020 CLINICAL DATA:  Deep venous thrombosis EXAM: MRA ABDOMEN AND PELVIS WITHOUT CONTRAST TECHNIQUE: Multiplanar, multiecho pulse sequences of the abdomen and pelvis were obtained without intravenous contrast. Angiographic images of abdomen and pelvis were obtained using MRA technique without intravenous contrast. CONTRAST:  None COMPARISON:  None. FINDINGS: MRA ABDOMEN FINDINGS The examination is limited by motion artifact. Visualized lower extremity arterial inflow and outflow is widely patent. Internal iliac arteries are patent bilaterally. There is T1 peripheral hyperintense, T2 hypointense expansile soft tissue within the left common femoral vein, as well as the proximal and mid femoral vein in keeping with acute DVT. The left external iliac vein and common iliac veins are patent. The inferior vena cava is patent. On the right, the iliofemoral venous system is patent. MRI PELVIS FINDINGS Normal marrow signal activity. Visualized bowel is unremarkable. Prostate gland absent. Bladder is unremarkable. Rectum is unremarkable. No pathologic adenopathy within the pelvis. Numerous simple cortical cysts are seen within the kidneys bilaterally, the largest seen on the right and incompletely visualized but measuring at least 9.9 cm in greatest dimension. Superimposed bilateral renal cortical atrophy, right greater than left. Cholelithiasis noted. Extensive subcutaneous edema within the visualized left lower extremity IMPRESSION: Extensive acute DVT within the left common femoral vein and visualized central femoral vein. No ileo caval DVT. Extensive subcutaneous edema involving the visualized proximal left lower extremity. Cholelithiasis without superimposed inflammatory change. Renal cortical atrophy with superimposed innumerable renal cortical cysts identified. Electronically Signed   By: Fidela Salisbury MD   On: 12/27/2020 22:39   MR ANGIO ABDOMEN WO CONTRAST  Result Date: 12/27/2020 CLINICAL DATA:  Deep venous thrombosis EXAM: MRA ABDOMEN AND PELVIS WITHOUT CONTRAST TECHNIQUE: Multiplanar, multiecho pulse sequences of the abdomen and pelvis were obtained without intravenous contrast. Angiographic images of abdomen and pelvis were obtained using MRA technique without intravenous contrast. CONTRAST:  None COMPARISON:  None. FINDINGS: MRA ABDOMEN FINDINGS The examination is limited by motion artifact. Visualized lower extremity arterial inflow and outflow is widely patent. Internal iliac arteries are patent bilaterally. There is T1 peripheral hyperintense, T2 hypointense expansile soft tissue within the left common femoral vein, as well as the proximal and mid femoral vein in keeping with acute DVT. The left external iliac vein and common iliac veins are patent. The inferior vena cava is patent. On the right, the iliofemoral venous system is patent. MRI PELVIS FINDINGS Normal marrow signal activity. Visualized bowel is unremarkable. Prostate gland absent. Bladder is unremarkable. Rectum is unremarkable. No pathologic adenopathy within the pelvis. Numerous simple cortical cysts are seen within the  kidneys bilaterally, the largest seen on the right and incompletely visualized but measuring at least 9.9 cm in greatest dimension. Superimposed bilateral renal cortical atrophy, right greater than left. Cholelithiasis noted. Extensive subcutaneous edema within the visualized left lower extremity IMPRESSION: Extensive acute DVT within the left common femoral vein and visualized central femoral vein. No ileo caval DVT. Extensive subcutaneous edema involving the visualized proximal left lower extremity. Cholelithiasis without superimposed inflammatory change. Renal cortical atrophy with superimposed innumerable renal cortical cysts identified. Electronically Signed   By: Fidela Salisbury MD   On: 12/27/2020 22:39    US RENAL  Result Date: 12/28/2020 CLINICAL DATA:  Elevated creatinine EXAM: RENAL / URINARY TRACT ULTRASOUND COMPLETE COMPARISON:  CT 01/05/2015 FINDINGS: Right Kidney: Renal measurements: 22.3 x 12.4 x 9.7 cm = volume: 1399 mL. Only limited amount of visible cortex. No hydronephrosis. Numerous, greater than 10 cysts throughout the right renal parenchyma. The largest cysts are seen in the upper pole measuring 10.6 x 9.2 x 9 cm and at the lower pole measuring 5.9 x 5.9 x 5.9 cm. Left Kidney: Renal measurements: 11.6 x 6.5 x 7.4 cm = volume: 293 mL. Only limited visible cortex. Cortex is echogenic. No hydronephrosis. Multiple cysts, fewer than 10. The largest are seen in the lower pole and measure 4.6 x 3.6 x 4.5 cm and 3.3 x 3.2 x 3.8 cm. Bladder: Appears normal for degree of bladder distention. Other: None. IMPRESSION: 1. Negative for hydronephrosis. 2. Multiple cysts within the right greater than left kidney. 3. Echogenic left renal cortex consistent with medical renal disease. Electronically Signed   By: Donavan Foil M.D.   On: 12/28/2020 17:59   VAS [place] LOWER EXTREMITY VENOUS (DVT)  Result Date: 12/27/2020  Lower Venous DVT Study Indications: Edema.  Comparison Study: No previous exam. Performing Technologist: Vonzell Schlatter RVT  Examination Guidelines: A complete evaluation includes B-mode imaging, spectral Doppler, color Doppler, and power Doppler as needed of all accessible portions of each vessel. Bilateral testing is considered an integral part of a complete examination. Limited examinations for reoccurring indications may be performed as noted. The reflux portion of the exam is performed with the patient in reverse Trendelenburg.  +-----+---------------+---------+-----------+----------+--------------+ RIGHTCompressibilityPhasicitySpontaneityPropertiesThrombus Aging +-----+---------------+---------+-----------+----------+--------------+ CFV  Full           Yes      Yes                                  +-----+---------------+---------+-----------+----------+--------------+ SFJ  Full                                                        +-----+---------------+---------+-----------+----------+--------------+   +---------+---------------+---------+-----------+----------+--------------+ LEFT     CompressibilityPhasicitySpontaneityPropertiesThrombus Aging +---------+---------------+---------+-----------+----------+--------------+ CFV      None           No       No                                  +---------+---------------+---------+-----------+----------+--------------+ SFJ      None                                                        +---------+---------------+---------+-----------+----------+--------------+  FV Prox  None                                                        +---------+---------------+---------+-----------+----------+--------------+ FV Mid   None                                                        +---------+---------------+---------+-----------+----------+--------------+ FV DistalNone                                                        +---------+---------------+---------+-----------+----------+--------------+ PFV      None                                                        +---------+---------------+---------+-----------+----------+--------------+ POP      None           No       No                                  +---------+---------------+---------+-----------+----------+--------------+ PTV      None                                                        +---------+---------------+---------+-----------+----------+--------------+ PERO                                                  Not visualized +---------+---------------+---------+-----------+----------+--------------+ Gastroc  None                                                         +---------+---------------+---------+-----------+----------+--------------+   Left Technical Findings: Not visualized segments include Peroneal veins.   Summary: RIGHT: - No evidence of deep vein thrombosis in the lower extremity. No indirect evidence of obstruction proximal to the inguinal ligament.  LEFT: - Findings consistent with acute deep vein thrombosis involving the left common femoral vein, SF junction, left femoral vein, left proximal profunda vein, left popliteal vein, left posterior tibial veins, and left gastrocnemius veins. - Acute DVT in left EIV.  *See table(s) above for measurements and observations. Electronically signed by Curt Jews MD on 12/27/2020 at 1:59:41 PM.    Final         Scheduled Meds: . amLODipine  10 mg Oral Daily  . donepezil  10  mg Oral QHS  . QUEtiapine  50 mg Oral BID   Continuous Infusions: . heparin 1,050 Units/hr (12/29/20 0600)     LOS: 2 days   Time spent: 25 mins Greater than 50% of this time was spent in counseling, explanation of diagnosis, planning of further management, and coordination of care.  I have personally reviewed and interpreted on  12/29/2020 daily labs, I reviewed all nursing notes, pharmacy notes, consultant notes,  vitals, pertinent old records  I have discussed plan of care as described above with RN , patient and family on 12/29/2020  Voice Recognition /Dragon dictation system was used to create this note, attempts have been made to correct errors. Please contact the author with questions and/or clarifications.   Florencia Reasons, MD PhD FACP [facility]  Available via Epic secure chat 7am-7pm for nonurgent issues Please page for urgent issues To page the attending provider between 7A-7P or the covering provider during after hours 7P-7A, please log into the web site [email or web address] and access using universal [place] password for that web site. If you do not have the password, please call the hospital operator.    12/29/2020,  7:59 AM

## 2020-12-30 DIAGNOSIS — N179 Acute kidney failure, unspecified: Secondary | ICD-10-CM | POA: Diagnosis not present

## 2020-12-30 DIAGNOSIS — F0150 Vascular dementia without behavioral disturbance: Secondary | ICD-10-CM | POA: Diagnosis not present

## 2020-12-30 DIAGNOSIS — N189 Chronic kidney disease, unspecified: Secondary | ICD-10-CM | POA: Diagnosis not present

## 2020-12-30 DIAGNOSIS — I82402 Acute embolism and thrombosis of unspecified deep veins of left lower extremity: Secondary | ICD-10-CM | POA: Diagnosis not present

## 2020-12-30 LAB — BASIC METABOLIC PANEL
Anion gap: 9 (ref 5–15)
BUN: 28 mg/dL — ABNORMAL HIGH (ref 8–23)
CO2: 24 mmol/L (ref 22–32)
Calcium: 8.8 mg/dL — ABNORMAL LOW (ref 8.9–10.3)
Chloride: 105 mmol/L (ref 98–111)
Creatinine, Ser: 2.23 mg/dL — ABNORMAL HIGH (ref 0.61–1.24)
GFR, Estimated: 28 mL/min — ABNORMAL LOW (ref >60)
Glucose, Bld: 126 mg/dL — ABNORMAL HIGH (ref 70–99)
Potassium: 4.5 mmol/L (ref 3.5–5.1)
Sodium: 138 mmol/L (ref 135–145)

## 2020-12-30 LAB — CBC
HCT: 33.2 % — ABNORMAL LOW (ref 39.0–52.0)
Hemoglobin: 10 g/dL — ABNORMAL LOW (ref 13.0–17.0)
MCH: 22.3 pg — ABNORMAL LOW (ref 26.0–34.0)
MCHC: 30.1 g/dL (ref 30.0–36.0)
MCV: 74.1 fL — ABNORMAL LOW (ref 80.0–100.0)
Platelets: 231 10*3/uL (ref 150–400)
RBC: 4.48 MIL/uL (ref 4.22–5.81)
RDW: 13.9 % (ref 11.5–15.5)
WBC: 8.4 10*3/uL (ref 4.0–10.5)
nRBC: 0 % (ref 0.0–0.2)

## 2020-12-30 LAB — URINE CULTURE

## 2020-12-30 LAB — HEPARIN LEVEL (UNFRACTIONATED)
Heparin Unfractionated: 0.25 IU/mL — ABNORMAL LOW (ref 0.30–0.70)
Heparin Unfractionated: 0.45 IU/mL (ref 0.30–0.70)

## 2020-12-30 MED ORDER — APIXABAN 5 MG PO TABS
10.0000 mg | ORAL_TABLET | Freq: Two times a day (BID) | ORAL | Status: DC
  Administered 2020-12-30 – 2021-01-03 (×9): 10 mg via ORAL
  Filled 2020-12-30 (×8): qty 2
  Filled 2020-12-30: qty 4
  Filled 2020-12-30: qty 2

## 2020-12-30 MED ORDER — APIXABAN 5 MG PO TABS: 5.0000 mg | ORAL_TABLET | Freq: Two times a day (BID) | ORAL | Status: DC

## 2020-12-30 MED ORDER — HEPARIN (PORCINE) 25000 UT/250ML-% IV SOLN
1200.0000 [IU]/h | INTRAVENOUS | Status: AC
  Administered 2020-12-30: 1200 [IU]/h via INTRAVENOUS
  Filled 2020-12-30 (×3): qty 250

## 2020-12-30 MED ORDER — MINERAL OIL RE ENEM
1.0000 | ENEMA | Freq: Once | RECTAL | Status: AC
  Administered 2020-12-30: 1 via RECTAL
  Filled 2020-12-30: qty 1

## 2020-12-30 NOTE — Plan of Care (Signed)
Plan of care reviewed. 

## 2020-12-30 NOTE — NC FL2 (Signed)
[place] LEVEL OF CARE SCREENING TOOL     IDENTIFICATION  Patient Name: [name] [name] Birthdate: 1933-11-02 Sex: male Admission Date (Current Location): 12/27/2020  [facility] and [place] Number:  Herbalist and Address:  [facility],  [address] [place], [place]      Provider Number: [id number]  Attending Physician Name and Address:  Florencia Reasons, MD  Relative Name and Phone Number:  Keval, Nam [phone]  [phone]    Current Level of Care: Hospital Recommended Level of Care: [place] Prior Approval Number:    Date Approved/Denied:   PASRR Number:    Discharge Plan: SNF    Current Diagnoses: Patient Active Problem List   Diagnosis Date Noted  . DVT (deep venous thrombosis) ([place]) 12/27/2020  . Acute kidney injury superimposed on chronic kidney disease ([place]) 03/26/2016  . Syncope and collapse 11/06/2015  . Hypertension 11/06/2015  . Carotid sinus syncope or syndrome 11/06/2015  . Acute renal failure syndrome ([place])   . CKD (chronic kidney disease) stage 4, GFR 15-29 ml/min (HCC) 03/27/2015  . Anemia of chronic disease 03/27/2015  . Syncope 12/02/2011  . HTN (hypertension) 12/02/2011    Orientation RESPIRATION BLADDER Height & Weight     Self  Normal Incontinent Weight: 225 lb (102.1 kg) Height:  6\' 1"  (185.4 cm)  BEHAVIORAL SYMPTOMS/MOOD NEUROLOGICAL BOWEL NUTRITION STATUS      Incontinent Diet (see d/c summary)  AMBULATORY STATUS COMMUNICATION OF NEEDS Skin   Extensive Assist Verbally Normal                       Personal Care Assistance Level of Assistance  Bathing,Feeding,Dressing Bathing Assistance: Maximum assistance Feeding assistance: Limited assistance Dressing Assistance: Maximum assistance     Functional Limitations Info  Sight,Hearing,Speech Sight Info: Adequate Hearing Info: Adequate Speech Info: Adequate    SPECIAL CARE FACTORS FREQUENCY  PT (By licensed PT),OT  (By licensed OT)     PT Frequency: 5x/week OT Frequency: 5x/week            Contractures Contractures Info: Not present    Additional Factors Info  Code Status,Allergies,Psychotropic Code Status Info: Fullcode Allergies Info: Allergies: No Known Allergies Psychotropic Info: Seroquel         Current Medications (12/30/2020):  This is the current hospital active medication list Current Facility-Administered Medications  Medication Dose Route Frequency Provider Last Rate Last Admin  . acetaminophen (TYLENOL) tablet 650 mg  650 mg Oral Q6H PRN Harold Hedge, MD       Or  . acetaminophen (TYLENOL) suppository 650 mg  650 mg Rectal Q6H PRN Harold Hedge, MD      . amLODipine (NORVASC) tablet 10 mg  10 mg Oral Daily Harold Hedge, MD   10 mg at 12/30/20 1015  . donepezil (ARICEPT) tablet 10 mg  10 mg Oral QHS Harold Hedge, MD   10 mg at 12/29/20 2306  . furosemide (LASIX) tablet 20 mg  20 mg Oral Daily PRN Harold Hedge, MD      . heparin ADULT infusion 100 units/mL (25000 units/279mL)  1,200 Units/hr Intravenous Continuous Poindexter, Leann T, RPH 12 mL/hr at 12/30/20 1309 1,200 Units/hr at 12/30/20 1309  . HYDROcodone-acetaminophen (NORCO/VICODIN) 5-325 MG per tablet 1-2 tablet  1-2 tablet Oral Q4H PRN Harold Hedge, MD      . LORazepam (ATIVAN) tablet 0.5 mg  0.5 mg Oral Q8H PRN Harold Hedge, MD      .  polyethylene glycol (MIRALAX / GLYCOLAX) packet 17 g  17 g Oral Daily PRN Harold Hedge, MD      . polyethylene glycol (MIRALAX / GLYCOLAX) packet 17 g  17 g Oral Daily Florencia Reasons, MD   17 g at 12/30/20 1015  . QUEtiapine (SEROQUEL) tablet 50 mg  50 mg Oral BID Harold Hedge, MD   50 mg at 12/30/20 1015  . senna-docusate (Senokot-S) tablet 1 tablet  1 tablet Oral BID Florencia Reasons, MD   1 tablet at 12/30/20 1015     Discharge Medications: Please see discharge summary for a list of discharge medications.  Relevant Imaging Results:  Relevant Lab Results:   Additional  Information ssn:[id number]  Lia Hopping, LCSW

## 2020-12-30 NOTE — Progress Notes (Signed)
PROGRESS NOTE    [name] [name]  OIN:[id number] DOB: [birth year] DOA: 12/27/2020 PCP: [facility]    Chief Complaint  Patient presents with  . blood clot    Brief Narrative:  prostate cancer, dementia, hypertension, CKD 4, nephrolithiasis who presented to the ED with left lower extremity swelling that started 12/25/2020.  He initially presented to [facility] in the evening of 1/3 and was found to have an elevated D-dimer of >20 concerning for DVT.  Unfortunately, ultrasound was unavailable to perform the study at that point in time and so he was given a shot of Lovenox, discharged with Xarelto and told to follow-up with [facility] radiology on January 4, unfortunately ultrasound showed extensive DVT left lower extremity, patient was referred to ED, EDP discussed with IR who recommend admission and IR consultation  Subjective:  He is currently on heparin drip, denies pain He is awake and interactive this am, pleasantly demented Cr improving still no BM   Assessment & Plan:   Principal Problem:   DVT (deep venous thrombosis) (HCC) Active Problems:   HTN (hypertension)   CKD (chronic kidney disease) stage 4, GFR 15-29 ml/min (HCC)   Acute kidney injury superimposed on chronic kidney disease (HCC)   Extensive acute DVT left lower extremity -Risk factor including immobility, he does have history of prostate cancer,  PSA 4.01, - -Per IR currently there is no evidence of phlegmasia cerulea dolens ,patient is not a candidate for lytics or embolectomy due to overall frailty and renal impairment, IR recommended anticoagulation and follow-up with IR in 2 weeks for reevaluation, they may consider IVC filter placement if patient is not able to tolerate anticoagulation -IR also agrees to palliative care consult -Continue heparin drip,  change to eliquis today,  left leg appear improving  AKI on CKD 4 -Creatinine on presentation 3.39, this morning 2.23, baseline around 2.1 -clean UA    -bladder scan 0, renal ultrasound no hydronephrosis -Renal dosing meds   Noninsulin-dependent type 2 diabetes, controlled, A1c 5.8 Home oral meds held, diet control here  Hypertension Continue Norvasc  Dementia Per wife patient currently is at baseline Continue Aricept and Seroquel  Constipation: Start stool softener, wife agreed to enema  Failure to thrive:  physical therapy recommended skilled nursing facility placement, wife agrees to it Wife also agreed to palliative care consult Palliative care input appreciated  DVT prophylaxis: On heparin drip   Code Status: Full, wife was going to talk to patient and palliative care further regarding CODE STATUS Family Communication: Wife over the phone today Disposition:   Status is: Inpatient  Dispo: The patient is from: Home              Anticipated d/c is to: Skilled nursing facility placement with palliative care following              Anticipated d/c date is: 24 to 48 hours                Consultants:   IR  Palliative care  Procedures:   None  Antimicrobials:   None     Objective: Vitals:   12/29/20 1428 12/30/20 0544 12/30/20 1051 12/30/20 1409  BP: (!) 141/86 (!) 166/105 (!) 170/79 (!) 154/83  Pulse: 80 (!) 56 64 78  Resp: 17 16 18 18   Temp: 98.5 F (36.9 C) 98.1 F (36.7 C) 98.1 F (36.7 C) (!) 97.5 F (36.4 C)  TempSrc: Oral Oral Oral Oral  SpO2: 98% 98% 98% 97%  Weight:      Height:        Intake/Output Summary (Last 24 hours) at 12/30/2020 1635 Last data filed at 12/30/2020 1057 Gross per 24 hour  Intake 839.97 ml  Output 600 ml  Net 239.97 ml   Filed Weights   12/27/20 1411 12/27/20 1441  Weight: 102.1 kg 102.1 kg    Examination:  General exam: calm, NAD, pleasantly demented, some resting tremor Respiratory system: Clear to auscultation. Respiratory effort normal. Cardiovascular system: S1 & S2 heard, RRR.  No pedal edema. Gastrointestinal system: Abdomen is nondistended, soft and  nontender.  Normal bowel sounds heard. Central nervous system: Alert and oriented to person. No focal neurological deficits. Extremities: Left lower extremity edema has improved some Skin: No rashes, lesions or ulcers Psychiatry: Pleasantly demented    Data Reviewed: I have personally reviewed following labs and imaging studies  CBC: Recent Labs  Lab 12/27/20 0318 12/27/20 1426 12/28/20 0446 12/30/20 0359  WBC 12.6* 11.1* 9.7 8.4  NEUTROABS 9.5* 8.4*  --   --   HGB 12.2* 12.2* 10.6* 10.0*  HCT 41.4 41.6 35.9* 33.2*  MCV 75.4* 75.4* 76.1* 74.1*  PLT 247 260 214 283    Basic Metabolic Panel: Recent Labs  Lab 12/27/20 0318 12/27/20 1426 12/28/20 0446 12/29/20 0424 12/30/20 0359  NA 143 144 145 138 138  K 4.5 4.7 4.6 4.4 4.5  CL 104 106 109 106 105  CO2 26 28 26 23 24   GLUCOSE 185* 131* 128* 122* 126*  BUN 40* 41* 35* 31* 28*  CREATININE 3.39* 2.59* 2.53* 2.28* 2.23*  CALCIUM 9.2 9.7 9.0 8.8* 8.8*    GFR: Estimated Creatinine Clearance: 30.4 mL/min (A) (by C-G formula based on SCr of 2.23 mg/dL (H)).  Liver Function Tests: No results for input(s): AST, ALT, ALKPHOS, BILITOT, PROT, ALBUMIN in the last 168 hours.  CBG: No results for input(s): GLUCAP in the last 168 hours.   Recent Results (from the past 240 hour(s))  Resp Panel by RT-PCR (Flu A&B, Covid) Nasopharyngeal Swab     Status: None   Collection Time: 12/27/20  2:27 PM   Specimen: Nasopharyngeal Swab; Nasopharyngeal(NP) swabs in vial transport medium  Result Value Ref Range Status   SARS Coronavirus 2 by RT PCR NEGATIVE NEGATIVE Final    Comment: (NOTE) SARS-CoV-2 target nucleic acids are NOT DETECTED.  The SARS-CoV-2 RNA is generally detectable in upper respiratory specimens during the acute phase of infection. The lowest concentration of SARS-CoV-2 viral copies this assay can detect is 138 copies/mL. A negative result does not preclude SARS-Cov-2 infection and should not be used as the sole basis  for treatment or other patient management decisions. A negative result may occur with  improper specimen collection/handling, submission of specimen other than nasopharyngeal swab, presence of viral mutation(s) within the areas targeted by this assay, and inadequate number of viral copies(<138 copies/mL). A negative result must be combined with clinical observations, patient history, and epidemiological information. The expected result is Negative.  Fact Sheet for Patients:  EntrepreneurPulse.com.au  Fact Sheet for Healthcare Providers:  IncredibleEmployment.be  This test is no t yet approved or cleared by the [place] FDA and  has been authorized for detection and/or diagnosis of SARS-CoV-2 by FDA under an Emergency Use Authorization (EUA). This EUA will remain  in effect (meaning this test can be used) for the duration of the COVID-19 declaration under Section 564(b)(1) of the Act, 21 U.S.C.section 360bbb-3(b)(1), unless the authorization is terminated  or revoked  sooner.       Influenza A by PCR NEGATIVE NEGATIVE Final   Influenza B by PCR NEGATIVE NEGATIVE Final    Comment: (NOTE) The Xpert Xpress SARS-CoV-2/FLU/RSV plus assay is intended as an aid in the diagnosis of influenza from Nasopharyngeal swab specimens and should not be used as a sole basis for treatment. Nasal washings and aspirates are unacceptable for Xpert Xpress SARS-CoV-2/FLU/RSV testing.  Fact Sheet for Patients: EntrepreneurPulse.com.au  Fact Sheet for Healthcare Providers: IncredibleEmployment.be  This test is not yet approved or cleared by the [place] FDA and has been authorized for detection and/or diagnosis of SARS-CoV-2 by FDA under an Emergency Use Authorization (EUA). This EUA will remain in effect (meaning this test can be used) for the duration of the COVID-19 declaration under Section 564(b)(1) of the Act, 21  U.S.C. section 360bbb-3(b)(1), unless the authorization is terminated or revoked.  Performed at [facility], [place] [address] [place], [place] [address]   Culture, Urine     Status: Abnormal   Collection Time: 12/29/20  5:00 AM   Specimen: Urine, Clean Catch  Result Value Ref Range Status   Specimen Description   Final    URINE, CLEAN CATCH Performed at [facility], [place] [address] [place], [place] [address]    Special Requests   Final    NONE Performed at [facility], [place] [address], [place], [place] [address]    Culture MULTIPLE SPECIES PRESENT, SUGGEST RECOLLECTION (A)  Final   Report Status 12/30/2020 FINAL  Final         Radiology Studies: US RENAL  Result Date: 12/28/2020 CLINICAL DATA:  Elevated creatinine EXAM: RENAL / URINARY TRACT ULTRASOUND COMPLETE COMPARISON:  CT 01/05/2015 FINDINGS: Right Kidney: Renal measurements: 22.3 x 12.4 x 9.7 cm = volume: 1399 mL. Only limited amount of visible cortex. No hydronephrosis. Numerous, greater than 10 cysts throughout the right renal parenchyma. The largest cysts are seen in the upper pole measuring 10.6 x 9.2 x 9 cm and at the lower pole measuring 5.9 x 5.9 x 5.9 cm. Left Kidney: Renal measurements: 11.6 x 6.5 x 7.4 cm = volume: 293 mL. Only limited visible cortex. Cortex is echogenic. No hydronephrosis. Multiple cysts, fewer than 10. The largest are seen in the lower pole and measure 4.6 x 3.6 x 4.5 cm and 3.3 x 3.2 x 3.8 cm. Bladder: Appears normal for degree of bladder distention. Other: None. IMPRESSION: 1. Negative for hydronephrosis. 2. Multiple cysts within the right greater than left kidney. 3. Echogenic left renal cortex consistent with medical renal disease. Electronically Signed   By: Donavan Foil M.D.   On: 12/28/2020 17:59        Scheduled Meds: . amLODipine  10 mg Oral Daily  . donepezil  10 mg Oral QHS  . mineral oil  1 enema Rectal Once  .  polyethylene glycol  17 g Oral Daily  . QUEtiapine  50 mg Oral BID  . senna-docusate  1 tablet Oral BID   Continuous Infusions: . heparin 1,200 Units/hr (12/30/20 1309)     LOS: 3 days   Time spent: 25 mins Greater than 50% of this time was spent in counseling, explanation of diagnosis, planning of further management, and coordination of care.  I have personally reviewed and interpreted on  12/30/2020 daily labs, I reviewed all nursing notes, pharmacy notes, consultant notes,  vitals, pertinent old records  I have discussed plan of care as described above with  RN , patient and family on 12/30/2020  Voice Recognition /Dragon dictation system was used to create this note, attempts have been made to correct errors. Please contact the author with questions and/or clarifications.   Florencia Reasons, MD PhD FACP [facility]  Available via Epic secure chat 7am-7pm for nonurgent issues Please page for urgent issues To page the attending provider between 7A-7P or the covering provider during after hours 7P-7A, please log into the web site [email or web address] and access using universal [place] password for that web site. If you do not have the password, please call the hospital operator.    12/30/2020, 4:35 PM

## 2020-12-30 NOTE — Progress Notes (Signed)
ANTICOAGULATION CONSULT NOTE - Follow Up Consult  Pharmacy Consult for Heparin Indication: DVT  No Known Allergies  Patient Measurements: Height: 6\' 1"  (185.4 cm) Weight: 102.1 kg (225 lb) IBW/kg (Calculated) : 79.9 Heparin Dosing Weight: 100kg  Vital Signs: Temp: 98.1 F (36.7 C) (01/06 2304) Temp Source: Oral (01/06 2304) BP: 153/96 (01/06 2304) Pulse Rate: 64 (01/06 2304)  Labs: Recent Labs    12/27/20 1426 12/27/20 2229 12/28/20 0446 12/28/20 0950 12/29/20 0424 12/29/20 1600 12/30/20 0359  HGB 12.2*  --  10.6*  --   --   --  10.0*  HCT 41.6  --  35.9*  --   --   --  33.2*  PLT 260  --  214  --   --   --  231  HEPARINUNFRC  --    < > 1.08*   < > 0.70 0.39 0.25*  CREATININE 2.59*  --  2.53*  --  2.28*  --  2.23*   < > = values in this interval not displayed.    Estimated Creatinine Clearance: 30.4 mL/min (A) (by C-G formula based on SCr of 2.23 mg/dL (H)).   Medications:  Scheduled:  . amLODipine  10 mg Oral Daily  . donepezil  10 mg Oral QHS  . polyethylene glycol  17 g Oral Daily  . QUEtiapine  50 mg Oral BID  . senna-docusate  1 tablet Oral BID   Infusions:  . heparin      Assessment: 40 yoM admitted with acute DVT and AKI; pharmacy is consulted to dose Heparin IV.  He was initially seen at [facility] on 12/28/19 and given a dose of enoxaparin around 3am and an outpatient prescription for Xarelto.  Admitted for IR consult. Will plan no heparin bolus due to recent enoxaparin in the setting of AKI.   Today, 12/30/2020:  HL = 0.25 (subtherapeutic) this AM with heparin gtt @ 1000 units/hr   Hgb = 10 (stable), PLTC = 231K  Looks like daily CBC was ordered from [facility] but did not cross over upon transfer  SCr relatively stable (2.18)  No bleeding or complications noted  Goal of Therapy:  Heparin level 0.3-0.7 units/ml Monitor platelets by anticoagulation protocol: Yes   Plan:  Increase heparin infusion rate to 1200 units/hr Recheck HL in 8 hours F/U plans  for transition to oral anticoagulation Daily heparin level and CBC  Donel Osowski, Toribio Harbour, PharmD 12/30/2020 5:09 AM

## 2020-12-30 NOTE — Progress Notes (Addendum)
Pharmacy - IV heparin  Assessment:    Please see note from Dalbert Mayotte, PharmD earlier today for full details.  Briefly, 87 y.o. male on IV heparin for acute DVT in setting of AKI.   Most recent heparin level therapeutic at 0.45 on 1200 units/hr  No bleeding or infusion issues per RN  May transition to Eliquis later today  Plan:   Continue heparin at 1200 units/hr  Recheck confirmatory heparin level in 8 hr (if not transitioning to DOAC)  Daily CBC and heparin level  [name], PharmD, BCPS [phone] 12/30/2020, 3:35 PM  ADDENDUM   Transition to Eliquis tonight  Stop heparin, start Eliquis 10 mg PO bid x 7d, followed by 5 mg PO bid thereafter  Pharmacy will provide Eliquis education prior to discharge  [name], PharmD, BCPS [phone] 12/30/2020, 8:25 PM

## 2020-12-30 NOTE — Progress Notes (Signed)
Physical Therapy Treatment Patient Details Name: [name] [name] MRN: [id number] DOB: May 03, 1933 Today's Date: 12/30/2020    History of Present Illness Pt is an 87 year old male with PMHx significant for prostate cancer, dementia, hypertension, CKD 4, nephrolithiasis who presented to the ED with left lower extremity swelling that started 12/25/2020.  He initially presented to [facility] in the evening of 1/3 and was found to have an elevated D-dimer of >20 concerning for DVT.  Unfortunately, ultrasound was unavailable to perform the study at that point in time and so he was given a shot of Lovenox, discharged with Xarelto and told to follow-up with [facility] radiology on January 4, unfortunately ultrasound showed extensive DVT left lower extremity, patient was referred to ED, EDP discussed with IR who recommend admission and IR consultation    PT Comments    Pt pleasant and cooperative.  Pt able to ambulate short distance in hallway.  Continue to recommend SNF upon d/c.   Follow Up Recommendations  SNF     Equipment Recommendations  None recommended by PT    Recommendations for Other Services       Precautions / Restrictions Precautions Precautions: Fall    Mobility  Bed Mobility Overal bed mobility: Needs Assistance Bed Mobility: Supine to Sit     Supine to sit: Min guard     General bed mobility comments: required functional cues for mobilizing to EOB  Transfers Overall transfer level: Needs assistance Equipment used: Rolling walker (2 wheeled) Transfers: Sit to/from Stand Sit to Stand: Min assist         General transfer comment: assist to rise and steady, multimodal cues for technique  Ambulation/Gait Ambulation/Gait assistance: Min assist Gait Distance (Feet): 100 Feet Assistive device: Rolling walker (2 wheeled) Gait Pattern/deviations: Step-through pattern;Decreased stride length     General Gait Details: verbal and visual cues for safe use of RW and  staying within RW, pt denies any dizziness, dyspnea or pain   Stairs             Wheelchair Mobility    Modified Rankin (Stroke Patients Only)       Balance Overall balance assessment: Needs assistance         Standing balance support: Bilateral upper extremity supported Standing balance-Leahy Scale: Poor Standing balance comment: reliant on UE support                            Cognition Arousal/Alertness: Awake/alert Behavior During Therapy: WFL for tasks assessed/performed Overall Cognitive Status: History of cognitive impairments - at baseline                                 General Comments: A x Ox1      Exercises      General Comments        Pertinent Vitals/Pain Pain Assessment: No/denies pain    Home Living                      Prior Function            PT Goals (current goals can now be found in the care plan section) Progress towards PT goals: Progressing toward goals    Frequency    Min 2X/week      PT Plan Current plan remains appropriate    Co-evaluation  AM-PAC PT "6 Clicks" Mobility   Outcome Measure  Help needed turning from your back to your side while in a flat bed without using bedrails?: A Little Help needed moving from lying on your back to sitting on the side of a flat bed without using bedrails?: A Little Help needed moving to and from a bed to a chair (including a wheelchair)?: A Little Help needed standing up from a chair using your arms ([name].g., wheelchair or bedside chair)?: A Little Help needed to walk in hospital room?: A Little Help needed climbing 3-5 steps with a railing? : A Lot 6 Click Score: 17    End of Session Equipment Utilized During Treatment: Gait belt Activity Tolerance: Patient tolerated treatment well Patient left: in chair;with call bell/phone within reach;with chair alarm set Nurse Communication: Mobility status PT Visit Diagnosis:  Difficulty in walking, not elsewhere classified (R26.2);Muscle weakness (generalized) (M62.81)     Time: 8185-6314 PT Time Calculation (min) (ACUTE ONLY): 14 min  Charges:  $Gait Training: 8-22 mins                    Jannette Spanner PT, DPT Acute Rehabilitation Services Pager: [phone] Office: [phone]  [name] [name],[name] [name] 12/30/2020, 1:30 PM

## 2020-12-30 NOTE — Plan of Care (Signed)
Plan of care reviewed and discussed with the patient and his wife. 

## 2020-12-31 DIAGNOSIS — F0150 Vascular dementia without behavioral disturbance: Secondary | ICD-10-CM | POA: Diagnosis not present

## 2020-12-31 DIAGNOSIS — N179 Acute kidney failure, unspecified: Secondary | ICD-10-CM | POA: Diagnosis not present

## 2020-12-31 DIAGNOSIS — I82402 Acute embolism and thrombosis of unspecified deep veins of left lower extremity: Secondary | ICD-10-CM | POA: Diagnosis not present

## 2020-12-31 DIAGNOSIS — N189 Chronic kidney disease, unspecified: Secondary | ICD-10-CM | POA: Diagnosis not present

## 2020-12-31 DIAGNOSIS — N184 Chronic kidney disease, stage 4 (severe): Secondary | ICD-10-CM | POA: Diagnosis not present

## 2020-12-31 DIAGNOSIS — Z515 Encounter for palliative care: Secondary | ICD-10-CM

## 2020-12-31 LAB — CBC
HCT: 34.6 % — ABNORMAL LOW (ref 39.0–52.0)
Hemoglobin: 10.6 g/dL — ABNORMAL LOW (ref 13.0–17.0)
MCH: 22.4 pg — ABNORMAL LOW (ref 26.0–34.0)
MCHC: 30.6 g/dL (ref 30.0–36.0)
MCV: 73.2 fL — ABNORMAL LOW (ref 80.0–100.0)
Platelets: 235 10*3/uL (ref 150–400)
RBC: 4.73 MIL/uL (ref 4.22–5.81)
RDW: 13.9 % (ref 11.5–15.5)
WBC: 8.8 10*3/uL (ref 4.0–10.5)
nRBC: 0 % (ref 0.0–0.2)

## 2020-12-31 LAB — BASIC METABOLIC PANEL
Anion gap: 9 (ref 5–15)
BUN: 24 mg/dL — ABNORMAL HIGH (ref 8–23)
CO2: 26 mmol/L (ref 22–32)
Calcium: 9 mg/dL (ref 8.9–10.3)
Chloride: 101 mmol/L (ref 98–111)
Creatinine, Ser: 2.21 mg/dL — ABNORMAL HIGH (ref 0.61–1.24)
GFR, Estimated: 28 mL/min — ABNORMAL LOW (ref >60)
Glucose, Bld: 137 mg/dL — ABNORMAL HIGH (ref 70–99)
Potassium: 4.5 mmol/L (ref 3.5–5.1)
Sodium: 136 mmol/L (ref 135–145)

## 2020-12-31 MED ORDER — POLYETHYLENE GLYCOL 3350 17 G PO PACK
17.0000 g | PACK | Freq: Two times a day (BID) | ORAL | Status: DC
  Administered 2020-12-31 – 2021-01-03 (×8): 17 g via ORAL
  Filled 2020-12-31 (×8): qty 1

## 2020-12-31 MED ORDER — SENNOSIDES-DOCUSATE SODIUM 8.6-50 MG PO TABS
2.0000 | ORAL_TABLET | Freq: Two times a day (BID) | ORAL | Status: DC
  Administered 2020-12-31 – 2021-01-03 (×8): 2 via ORAL
  Filled 2020-12-31 (×8): qty 2

## 2020-12-31 MED ORDER — BISACODYL 5 MG PO TBEC
10.0000 mg | DELAYED_RELEASE_TABLET | Freq: Every day | ORAL | Status: AC
  Administered 2020-12-31 – 2021-01-01 (×2): 10 mg via ORAL
  Filled 2020-12-31 (×2): qty 2

## 2020-12-31 NOTE — Progress Notes (Signed)
PMT no charge note.  Received correspondence from PMT colleague Lexine Baton, NP regarding the patient's wife being at the bedside now and her request for a face to face discussion about code status and overall goals of care.   [name] [name] is resting in bed, he lays in bed with his eyes closed but does awaken and respond some. His wife is at bedside. We reviewed about his current hospitalization course. I introduced myself and palliative care as follows:  Palliative medicine is specialized medical care for people living with serious illness. It focuses on providing relief from the symptoms and stress of a serious illness. The goal is to improve quality of life for both the patient and the family.  Goals of care: Broad aims of medical therapy in relation to the patient's values and preferences. Our aim is to provide medical care aimed at enabling patients to achieve the goals that matter most to them, given the circumstances of their particular medical situation and their constraints.   Goals, wishes and values important to the patient attempted to be discussed, in particular, we talked about code status full code versus DNR. Patient reportedly has said in the past that he would want to go when God calls him home.   Patient listens and responds, "ok" but doesn't verbalize a clear decision. It is unclear whether he is able to fully comprehend our discussion. We tried several different ways of addressing the code status clarification, how ever patient at this time, does not verbalize his wishes one way or the other.   Mrs Bordon will continue to discuss with patient regarding code status preferences and Lexine Baton NP will follow up with them on 01-02-21.   No charge  Loistine Chance MD [place] palliative.

## 2020-12-31 NOTE — Progress Notes (Signed)
PROGRESS NOTE    [name] [name]  ZYS:[id number] DOB: Mar 16, 1933 DOA: 12/27/2020 PCP: [facility]    Chief Complaint  Patient presents with  . blood clot    Brief Narrative:  prostate cancer, dementia, hypertension, CKD 4, nephrolithiasis who presented to the ED with left lower extremity swelling that started 12/25/2020.  He initially presented to [facility] in the evening of 1/3 and was found to have an elevated D-dimer of >20 concerning for DVT.  Unfortunately, ultrasound was unavailable to perform the study at that point in time and so he was given a shot of Lovenox, discharged with Xarelto and told to follow-up with [facility] radiology on January 4, unfortunately ultrasound showed extensive DVT left lower extremity, patient was referred to ED, EDP discussed with IR who recommend admission and IR consultation  Subjective:  He is off heparin drip, denies pain He is awake and interactive, pleasantly demented Cr improving still no BM after enema, denies ab pain  Assessment & Plan:   Principal Problem:   DVT (deep venous thrombosis) (HCC) Active Problems:   HTN (hypertension)   CKD (chronic kidney disease) stage 4, GFR 15-29 ml/min (HCC)   Acute kidney injury superimposed on chronic kidney disease (HCC)   Extensive acute DVT left lower extremity -Risk factor including immobility, he does have history of prostate cancer,  PSA 4.01, - -Per IR currently there is no evidence of phlegmasia cerulea dolens ,patient is not a candidate for lytics or embolectomy due to overall frailty and renal impairment, IR recommended anticoagulation and follow-up with IR in 2 weeks for reevaluation, they may consider IVC filter placement if patient is not able to tolerate anticoagulation -IR also agrees to palliative care consult -initially heparin drip,  Now changed to eliquis today,  left leg appear improving  AKI on CKD 4 -Creatinine on presentation 3.39, this morning 2.23, baseline around  2.1 -clean UA   -bladder scan 0, renal ultrasound no hydronephrosis -Renal dosing meds   Noninsulin-dependent type 2 diabetes, controlled, A1c 5.8 Home oral meds held, diet control here  Hypertension Continue Norvasc  Dementia Per wife patient currently is at baseline Continue Aricept and Seroquel  Constipation:  no BM after enema, increased stool softener dose and frequency, consider KUB if still no BM or developed abdominal pain  Failure to thrive:  physical therapy recommended skilled nursing facility placement, wife agrees to it Wife also agreed to palliative care consult Palliative care input appreciated  DVT prophylaxis: On heparin drip then eliquis   Code Status: Full, wife was going to talk to patient and palliative care further regarding CODE STATUS Family Communication: Wife over the phone today Disposition:   Status is: Inpatient  Dispo: The patient is from: Home              Anticipated d/c is to: Skilled nursing facility placement with palliative care following              Anticipated d/c date is: now medically stable to discharge to skilled nursing facility, awaiting for bed                Consultants:   IR  Palliative care  Procedures:   None  Antimicrobials:   None     Objective: Vitals:   12/30/20 2140 12/31/20 0532 12/31/20 0831 12/31/20 1401  BP: (!) 157/86 (!) 158/106 (!) 176/91 (!) 148/84  Pulse: 62 (!) 59 (!) 53 77  Resp: 16 17  15   Temp: 97.9 F (  36.6 C) 98.1 F (36.7 C)  98.9 F (37.2 C)  TempSrc: Oral Oral  Oral  SpO2: 99% 100%  94%  Weight:      Height:        Intake/Output Summary (Last 24 hours) at 12/31/2020 1656 Last data filed at 12/31/2020 1433 Gross per 24 hour  Intake 538.31 ml  Output 1250 ml  Net -711.69 ml   Filed Weights   12/27/20 1411 12/27/20 1441  Weight: 102.1 kg 102.1 kg    Examination:  General exam: calm, NAD, pleasantly demented, some resting tremor Respiratory system: Clear to auscultation.  Respiratory effort normal. Cardiovascular system: S1 & S2 heard, RRR.  No pedal edema. Gastrointestinal system: Abdomen is nondistended, soft and nontender.  Normal bowel sounds heard. Central nervous system: Alert and oriented to person. No focal neurological deficits. Extremities: Left lower extremity edema has improved some Skin: No rashes, lesions or ulcers Psychiatry: Pleasantly demented    Data Reviewed: I have personally reviewed following labs and imaging studies  CBC: Recent Labs  Lab 12/27/20 0318 12/27/20 1426 12/28/20 0446 12/30/20 0359 12/31/20 0335  WBC 12.6* 11.1* 9.7 8.4 8.8  NEUTROABS 9.5* 8.4*  --   --   --   HGB 12.2* 12.2* 10.6* 10.0* 10.6*  HCT 41.4 41.6 35.9* 33.2* 34.6*  MCV 75.4* 75.4* 76.1* 74.1* 73.2*  PLT 247 260 214 231 381    Basic Metabolic Panel: Recent Labs  Lab 12/27/20 1426 12/28/20 0446 12/29/20 0424 12/30/20 0359 12/31/20 0335  NA 144 145 138 138 136  K 4.7 4.6 4.4 4.5 4.5  CL 106 109 106 105 101  CO2 28 26 23 24 26   GLUCOSE 131* 128* 122* 126* 137*  BUN 41* 35* 31* 28* 24*  CREATININE 2.59* 2.53* 2.28* 2.23* 2.21*  CALCIUM 9.7 9.0 8.8* 8.8* 9.0    GFR: Estimated Creatinine Clearance: 30.7 mL/min (A) (by C-G formula based on SCr of 2.21 mg/dL (H)).  Liver Function Tests: No results for input(s): AST, ALT, ALKPHOS, BILITOT, PROT, ALBUMIN in the last 168 hours.  CBG: No results for input(s): GLUCAP in the last 168 hours.   Recent Results (from the past 240 hour(s))  Resp Panel by RT-PCR (Flu A&B, Covid) Nasopharyngeal Swab     Status: None   Collection Time: 12/27/20  2:27 PM   Specimen: Nasopharyngeal Swab; Nasopharyngeal(NP) swabs in vial transport medium  Result Value Ref Range Status   SARS Coronavirus 2 by RT PCR NEGATIVE NEGATIVE Final    Comment: (NOTE) SARS-CoV-2 target nucleic acids are NOT DETECTED.  The SARS-CoV-2 RNA is generally detectable in upper respiratory specimens during the acute phase of  infection. The lowest concentration of SARS-CoV-2 viral copies this assay can detect is 138 copies/mL. A negative result does not preclude SARS-Cov-2 infection and should not be used as the sole basis for treatment or other patient management decisions. A negative result may occur with  improper specimen collection/handling, submission of specimen other than nasopharyngeal swab, presence of viral mutation(s) within the areas targeted by this assay, and inadequate number of viral copies(<138 copies/mL). A negative result must be combined with clinical observations, patient history, and epidemiological information. The expected result is Negative.  Fact Sheet for Patients:  EntrepreneurPulse.com.au  Fact Sheet for Healthcare Providers:  IncredibleEmployment.be  This test is no t yet approved or cleared by the [place] FDA and  has been authorized for detection and/or diagnosis of SARS-CoV-2 by FDA under an Emergency Use Authorization (EUA). This EUA will  remain  in effect (meaning this test can be used) for the duration of the COVID-19 declaration under Section 564(b)(1) of the Act, 21 U.S.C.section 360bbb-3(b)(1), unless the authorization is terminated  or revoked sooner.       Influenza A by PCR NEGATIVE NEGATIVE Final   Influenza B by PCR NEGATIVE NEGATIVE Final    Comment: (NOTE) The Xpert Xpress SARS-CoV-2/FLU/RSV plus assay is intended as an aid in the diagnosis of influenza from Nasopharyngeal swab specimens and should not be used as a sole basis for treatment. Nasal washings and aspirates are unacceptable for Xpert Xpress SARS-CoV-2/FLU/RSV testing.  Fact Sheet for Patients: EntrepreneurPulse.com.au  Fact Sheet for Healthcare Providers: IncredibleEmployment.be  This test is not yet approved or cleared by the [place] FDA and has been authorized for detection and/or diagnosis of SARS-CoV-2  by FDA under an Emergency Use Authorization (EUA). This EUA will remain in effect (meaning this test can be used) for the duration of the COVID-19 declaration under Section 564(b)(1) of the Act, 21 U.S.C. section 360bbb-3(b)(1), unless the authorization is terminated or revoked.  Performed at [facility], [place] [address], [place], [place] [address]   Culture, Urine     Status: Abnormal   Collection Time: 12/29/20  5:00 AM   Specimen: Urine, Clean Catch  Result Value Ref Range Status   Specimen Description   Final    URINE, CLEAN CATCH Performed at [facility], [place] [address] [place], [place] [address]    Special Requests   Final    NONE Performed at [facility], [place] [address], [place],  [address]    Culture MULTIPLE SPECIES PRESENT, SUGGEST RECOLLECTION (A)  Final   Report Status 12/30/2020 FINAL  Final         Radiology Studies: No results found.      Scheduled Meds: . amLODipine  10 mg Oral Daily  . apixaban  10 mg Oral BID   Followed by  . [START ON 01/06/2021] apixaban  5 mg Oral BID  . bisacodyl  10 mg Oral Daily  . donepezil  10 mg Oral QHS  . polyethylene glycol  17 g Oral BID  . QUEtiapine  50 mg Oral BID  . senna-docusate  2 tablet Oral BID   Continuous Infusions:    LOS: 4 days   Time spent: 25 mins Greater than 50% of this time was spent in counseling, explanation of diagnosis, planning of further management, and coordination of care.  I have personally reviewed and interpreted on  12/31/2020 daily labs, I reviewed all nursing notes, pharmacy notes, consultant notes,  vitals, pertinent old records  I have discussed plan of care as described above with RN , patient and family on 12/31/2020  Voice Recognition /Dragon dictation system was used to create this note, attempts have been made to correct errors. Please contact the author with questions and/or clarifications.   Florencia Reasons, MD  PhD FACP [facility]  Available via Epic secure chat 7am-7pm for nonurgent issues Please page for urgent issues To page the attending provider between 7A-7P or the covering provider during after hours 7P-7A, please log into the web site [email or web address] and access using universal [place] password for that web site. If you do not have the password, please call the hospital operator.    12/31/2020, 4:56 PM

## 2020-12-31 NOTE — TOC Initial Note (Signed)
Transition of Care [facility]) - Initial/Assessment Note    Patient Details  Name: [name] [name] MRN: [id number] Date of Birth: 1933/06/10  Transition of Care [facility]) CM/SW Contact:    Lia Hopping, [place] Phone Number: 12/31/2020, 7:55 AM  Clinical Narrative:   Patient admitted for a DVT PT recommends SNF             CSW discussed met with the patient spouse to discuss rehab placement. Patient lives in the home with his spouse and reequire maximum assitstance with his ADL's.Spouse reports the patient has a nurse aide that comes out 5 days a weeks at 3 hours a day to assist the patient with bathing, etc.Spouse reports the patient is usually ambulatory with a a walker. She has to remind him to use his walker.  Spouse reports this is the first time the patient will go to SNF. CSW explain the SNF process. TOC staff will follow up with bed offers.  CSW offered choice for spouse to pursue SNF using VA benefits vs. [employer] Medicare benefits. Spouse agreeeable to use [facility] Medicare benefits.   TOC staff will follow up with bed offers and initiate authorization.    Expected Discharge Plan: [place]     Patient Goals and CMS Choice Patient states their goals for this hospitalization and ongoing recovery are:: go to rehab CMS Medicare.gov Compare Post Acute Care list provided to:: Patient Represenative (must comment) (Spouse) Choice offered to / list presented to : Spouse  Expected Discharge Plan and Services Expected Discharge Plan: [place] In-house Referral: Clinical Social Work   Post Acute Care Choice: [place] Living arrangements for the past 2 months: [place]                                      Prior Living Arrangements/Services Living arrangements for the past 2 months: Single Family Home Lives with:: Spouse Patient language and need for interpreter reviewed:: No        Need for Family Participation in Patient  Care: Yes (Comment) Care giver support system in place?: Yes (comment) Current home services: DME,Homehealth aide Criminal Activity/Legal Involvement Pertinent to Current Situation/Hospitalization: No - Comment as needed  Activities of Daily Living Home Assistive Devices/Equipment: CBG Meter,Dentures (specify type),Blood pressure cuff,Cane (specify quad or straight),Walker (specify type) (4 wheeled walker, upper denture, single point cane) ADL Screening (condition at time of admission) Patient's cognitive ability adequate to safely complete daily activities?: No Is the patient deaf or have difficulty hearing?: Yes (slight hoh) Does the patient have difficulty seeing, even when wearing glasses/contacts?: No Does the patient have difficulty concentrating, remembering, or making decisions?: Yes Patient able to express need for assistance with ADLs?: Yes Does the patient have difficulty dressing or bathing?: Yes Independently performs ADLs?: No Communication: Independent Dressing (OT): Needs assistance Is this a change from baseline?: Pre-admission baseline Grooming: Needs assistance Is this a change from baseline?: Pre-admission baseline Feeding: Needs assistance Is this a change from baseline?: Pre-admission baseline Bathing: Needs assistance Is this a change from baseline?: Pre-admission baseline Toileting: Needs assistance Is this a change from baseline?: Pre-admission baseline In/Out Bed: Needs assistance Is this a change from baseline?: Pre-admission baseline Walks in Home: Needs assistance Is this a change from baseline?: Pre-admission baseline Does the patient have difficulty walking or climbing stairs?: Yes (secondary to weakness and chronic pain) Weakness of Legs: Both Weakness of  Arms/Hands: None  Permission Sought/Granted   Permission granted to share information with : Yes, Verbal Permission Granted  Share Information with NAME: Gaza,Ernestine Spouse  Permission  granted to share info w AGENCY: SNF's in the area  Permission granted to share info w Relationship: Spouse  Permission granted to share info w Contact Information: [phone]  [phone]  Emotional Assessment Appearance:: Appears stated age Attitude/Demeanor/Rapport: Unable to Assess Affect (typically observed): Unable to Assess Orientation: : Oriented to Self,Oriented to Place,Oriented to  Time,Oriented to Situation Alcohol / Substance Use: Not Applicable Psych Involvement: No (comment)  Admission diagnosis:  DVT (deep venous thrombosis) (HCC) [I82.409] Acute deep vein thrombosis (DVT) of iliac vein of left lower extremity ([place]) [I82.422] Patient Active Problem List   Diagnosis Date Noted  . DVT (deep venous thrombosis) ([place]) 12/27/2020  . Acute kidney injury superimposed on chronic kidney disease ([place]) 03/26/2016  . Syncope and collapse 11/06/2015  . Hypertension 11/06/2015  . Carotid sinus syncope or syndrome 11/06/2015  . Acute renal failure syndrome ([place])   . CKD (chronic kidney disease) stage 4, GFR 15-29 ml/min (HCC) 03/27/2015  . Anemia of chronic disease 03/27/2015  . Syncope 12/02/2011  . HTN (hypertension) 12/02/2011   PCP:  [place]:   [place], [place] [phone] [place] [address] Phone: [phone] Fax: [phone]     Social Determinants of Health (SDOH) Interventions    Readmission Risk Interventions No flowsheet data found.

## 2020-12-31 NOTE — Progress Notes (Signed)
Daily Progress Note   Patient Name: [name] [name]       Date: 12/31/2020 DOB: 06-11-1933  Age: 87 y.o. MRN#: [id number] Attending Physician: Florencia Reasons, MD Primary Care Physician: [facility] Admit Date: 12/27/2020  Reason for Consultation/Follow-up: Establishing goals of care  Chart Reviewed.   No distress. No family at the bedside per updates.   I spoke with patient's wife, [name] [name] via phone. Updates provided. She verbalized appreciation sharing patient's behavior and mentation is at baseline.   I created space and opportunity to further discuss goals of care and what is most important for patient in the future (specifically Code Status). [name] [name] reports patient would not want to suffer. She shares patient has often expressed to her now and in the past that he would want to be allowed to pass away naturally stating "when God calls me home let me go, don't keep me here!" Support provided. [name] [name] reports she agrees and would not want to prolong patient's life, however she also would like to discuss it with him again, despite awareness of his dementia. She reports he is able to hold an appropriate conversation and feels she needs him to be ok with her wishes for DNR for him and to make sure he feels the same.  She plans to further discuss when she arrives to bedside later today.   Education provided on continued support with outpatient Palliative. [name] [name] verbalizes understanding and appreciation. She is requesting outpatient palliative support.   Wife is hopeful for continued stability and ability to soon discharge to SNF for rehabilitation.   All questions answered and support provided.   Length of Stay: 4 days  Vital Signs: BP (!) 176/91 (BP Location: Right Arm) Comment: Nurse notified  Pulse (!) 53   Temp 98.1 F (36.7 C) (Oral)   Resp 17   Ht 6\' 1"  (1.854 m)   Wt 102.1 kg   SpO2 100%   BMI 29.69 kg/m  SpO2: SpO2: 100 % O2 Device: O2 Device: Room Air O2  Flow Rate:               Palliative Care Assessment & Plan  HPI: Palliative Care consult requested for goals of care discussion in this 87 y.o. male with multiple medical problems including   prostate cancer, dementia,hypertension,CKD 4, andnephrolithiasis.Patient presented to ED after vascular study follow. He initially was seen at [facility] and found to have a D-dimer >20 concerning for DVT (Lovenox shot given) and discharged on Xarelto. During ED work-up doppler showed acute DVT involving the left common femoral vein, SF junction, femoral vein, proximal profunda vein, popliteal vein, posterior tibial vein and gastrocnemius veins.  Code Status: Full code (pending discussion)  Goals of Care/Recommendations: Continue to treat the treatable.  Wife hopeful discharge to SNF rehab soon with a request for outpatient palliative support (TOC referral placed to assist with referral).  Wife requesting to speak with patient and confirm previously expressed wishes for DNR (natural death). Education provided to wife given patient's dementia history respectfully the final decisions will remain hers.  PMT will continue to support and follow as needed.    Prognosis: Guarded  Discharge Planning: [facility] for rehab with Palliative care service follow-up  Thank you for allowing the Palliative Medicine Team to assist in the care of this patient.  Time Total: 45 min.   Visit consisted of counseling and education dealing with the complex and emotionally intense issues of symptom management  and palliative care in the setting of serious and potentially life-threatening illness.Greater than 50%  of this time was spent counseling and coordinating care related to the above assessment and plan.  Alda Lea, AGPCNP-BC  Palliative Medicine Team [phone]

## 2021-01-01 DIAGNOSIS — N179 Acute kidney failure, unspecified: Secondary | ICD-10-CM | POA: Diagnosis not present

## 2021-01-01 DIAGNOSIS — F0150 Vascular dementia without behavioral disturbance: Secondary | ICD-10-CM | POA: Diagnosis not present

## 2021-01-01 DIAGNOSIS — N189 Chronic kidney disease, unspecified: Secondary | ICD-10-CM | POA: Diagnosis not present

## 2021-01-01 DIAGNOSIS — I82402 Acute embolism and thrombosis of unspecified deep veins of left lower extremity: Secondary | ICD-10-CM | POA: Diagnosis not present

## 2021-01-01 LAB — BASIC METABOLIC PANEL
Anion gap: 9 (ref 5–15)
BUN: 31 mg/dL — ABNORMAL HIGH (ref 8–23)
CO2: 24 mmol/L (ref 22–32)
Calcium: 9 mg/dL (ref 8.9–10.3)
Chloride: 104 mmol/L (ref 98–111)
Creatinine, Ser: 2.16 mg/dL — ABNORMAL HIGH (ref 0.61–1.24)
GFR, Estimated: 29 mL/min — ABNORMAL LOW (ref >60)
Glucose, Bld: 132 mg/dL — ABNORMAL HIGH (ref 70–99)
Potassium: 4.5 mmol/L (ref 3.5–5.1)
Sodium: 137 mmol/L (ref 135–145)

## 2021-01-01 MED ORDER — SORBITOL 70 % SOLN
960.0000 mL | TOPICAL_OIL | Freq: Once | ORAL | Status: AC
  Administered 2021-01-01: 960 mL via RECTAL
  Filled 2021-01-01: qty 473

## 2021-01-01 NOTE — Progress Notes (Signed)
PROGRESS NOTE    [name] [name]  GEX:[id number] DOB: [birth year] DOA: 12/27/2020 PCP: [facility]    Chief Complaint  Patient presents with  . blood clot    Brief Narrative:  prostate cancer, dementia, hypertension, CKD 4, nephrolithiasis who presented to the ED with left lower extremity swelling that started 12/25/2020.  He initially presented to [name] in the evening of 1/3 and was found to have an elevated D-dimer of >20 concerning for DVT.  Unfortunately, ultrasound was unavailable to perform the study at that point in time and so he was given a shot of Lovenox, discharged with Xarelto and told to follow-up with [name] radiology on January 4, unfortunately ultrasound showed extensive DVT left lower extremity, patient was referred to ED, EDP discussed with IR who recommend admission and IR consultation  Subjective:   He is awake and interactive, pleasantly demented Cr improving still no BM , denies ab pain, no n/v Wife at bedside  Awaiting for snf bed  Assessment & Plan:   Principal Problem:   DVT (deep venous thrombosis) (HCC) Active Problems:   HTN (hypertension)   CKD (chronic kidney disease) stage 4, GFR 15-29 ml/min (HCC)   Acute kidney injury superimposed on chronic kidney disease ([place])   Palliative care by specialist   Extensive acute DVT left lower extremity -Risk factor including immobility, he does have history of prostate cancer,  PSA 4.01, - -Per IR currently there is no evidence of phlegmasia cerulea dolens ,patient is not a candidate for lytics or embolectomy due to overall frailty and renal impairment, IR recommended anticoagulation and follow-up with IR in 2 weeks for reevaluation, they may consider IVC filter placement if patient is not able to tolerate anticoagulation -IR also agrees to palliative care consult -initially heparin drip,  Now changed to eliquis today,  left leg appears improving  AKI on CKD 4 -Creatinine on presentation 3.39,  this morning 2.16, baseline around 2.1 -clean UA   -bladder scan 0, renal ultrasound no hydronephrosis -Renal dosing meds   Noninsulin-dependent type 2 diabetes, controlled, A1c 5.8 Home oral meds held, diet control here  Hypertension Continue Norvasc  Dementia Per wife patient currently is at baseline, he is pleasantly demented Continue Aricept and Seroquel  Constipation:  no BM on increased stool softener dose and frequency, give another enema, consider KUB if still no BM or developed abdominal pain Ab exam benign   Failure to thrive:  physical therapy recommended skilled nursing facility placement, wife agrees to it Wife also agreed to palliative care consult Palliative care input appreciated  DVT prophylaxis: On heparin drip then eliquis   Code Status: Full,  Family Communication: Wife at bedside  Disposition:   Status is: Inpatient  Dispo: The patient is from: Home              Anticipated d/c is to: Skilled nursing facility placement with palliative care following              Anticipated d/c date is:  medically stable to discharge to skilled nursing facility, awaiting for bed                Consultants:   IR  Palliative care  Procedures:   None  Antimicrobials:   None     Objective: Vitals:   12/31/20 1401 12/31/20 2239 01/01/21 0546 01/01/21 1300  BP: (!) 148/84 (!) 155/86 (!) 160/85 (!) 146/86  Pulse: 77 63 63 63  Resp: 15 20 16  17  Temp: 98.9 F (37.2 C) 97.6 F (36.4 C) 98.3 F (36.8 C) 98.3 F (36.8 C)  TempSrc: Oral Oral Oral Oral  SpO2: 94% 98% 94% 96%  Weight:      Height:        Intake/Output Summary (Last 24 hours) at 01/01/2021 1447 Last data filed at 01/01/2021 0159 Gross per 24 hour  Intake 0 ml  Output -  Net 0 ml   Filed Weights   12/27/20 1411 12/27/20 1441  Weight: 102.1 kg 102.1 kg    Examination:  General exam: calm, NAD, pleasantly demented, some resting tremor Respiratory system: Clear to auscultation.  Respiratory effort normal. Cardiovascular system: S1 & S2 heard, RRR.  No pedal edema. Gastrointestinal system: Abdomen is nondistended, soft and nontender.  Normal bowel sounds heard. Central nervous system: Alert and oriented to person. No focal neurological deficits. Extremities: Left lower extremity edema has improved some Skin: No rashes, lesions or ulcers Psychiatry: Pleasantly demented    Data Reviewed: I have personally reviewed following labs and imaging studies  CBC: Recent Labs  Lab 12/27/20 0318 12/27/20 1426 12/28/20 0446 12/30/20 0359 12/31/20 0335  WBC 12.6* 11.1* 9.7 8.4 8.8  NEUTROABS 9.5* 8.4*  --   --   --   HGB 12.2* 12.2* 10.6* 10.0* 10.6*  HCT 41.4 41.6 35.9* 33.2* 34.6*  MCV 75.4* 75.4* 76.1* 74.1* 73.2*  PLT 247 260 214 231 734    Basic Metabolic Panel: Recent Labs  Lab 12/28/20 0446 12/29/20 0424 12/30/20 0359 12/31/20 0335 01/01/21 0334  NA 145 138 138 136 137  K 4.6 4.4 4.5 4.5 4.5  CL 109 106 105 101 104  CO2 26 23 24 26 24   GLUCOSE 128* 122* 126* 137* 132*  BUN 35* 31* 28* 24* 31*  CREATININE 2.53* 2.28* 2.23* 2.21* 2.16*  CALCIUM 9.0 8.8* 8.8* 9.0 9.0    GFR: Estimated Creatinine Clearance: 31.4 mL/min (A) (by C-G formula based on SCr of 2.16 mg/dL (H)).  Liver Function Tests: No results for input(s): AST, ALT, ALKPHOS, BILITOT, PROT, ALBUMIN in the last 168 hours.  CBG: No results for input(s): GLUCAP in the last 168 hours.   Recent Results (from the past 240 hour(s))  Resp Panel by RT-PCR (Flu A&B, Covid) Nasopharyngeal Swab     Status: None   Collection Time: 12/27/20  2:27 PM   Specimen: Nasopharyngeal Swab; Nasopharyngeal(NP) swabs in vial transport medium  Result Value Ref Range Status   SARS Coronavirus 2 by RT PCR NEGATIVE NEGATIVE Final    Comment: (NOTE) SARS-CoV-2 target nucleic acids are NOT DETECTED.  The SARS-CoV-2 RNA is generally detectable in upper respiratory specimens during the acute phase of  infection. The lowest concentration of SARS-CoV-2 viral copies this assay can detect is 138 copies/mL. A negative result does not preclude SARS-Cov-2 infection and should not be used as the sole basis for treatment or other patient management decisions. A negative result may occur with  improper specimen collection/handling, submission of specimen other than nasopharyngeal swab, presence of viral mutation(s) within the areas targeted by this assay, and inadequate number of viral copies(<138 copies/mL). A negative result must be combined with clinical observations, patient history, and epidemiological information. The expected result is Negative.  Fact Sheet for Patients:  EntrepreneurPulse.com.au  Fact Sheet for Healthcare Providers:  IncredibleEmployment.be  This test is no t yet approved or cleared by the [place] FDA and  has been authorized for detection and/or diagnosis of SARS-CoV-2 by FDA under an Emergency Use  Authorization (EUA). This EUA will remain  in effect (meaning this test can be used) for the duration of the COVID-19 declaration under Section 564(b)(1) of the Act, 21 U.S.C.section 360bbb-3(b)(1), unless the authorization is terminated  or revoked sooner.       Influenza A by PCR NEGATIVE NEGATIVE Final   Influenza B by PCR NEGATIVE NEGATIVE Final    Comment: (NOTE) The Xpert Xpress SARS-CoV-2/FLU/RSV plus assay is intended as an aid in the diagnosis of influenza from Nasopharyngeal swab specimens and should not be used as a sole basis for treatment. Nasal washings and aspirates are unacceptable for Xpert Xpress SARS-CoV-2/FLU/RSV testing.  Fact Sheet for Patients: EntrepreneurPulse.com.au  Fact Sheet for Healthcare Providers: IncredibleEmployment.be  This test is not yet approved or cleared by the [place] FDA and has been authorized for detection and/or diagnosis of SARS-CoV-2  by FDA under an Emergency Use Authorization (EUA). This EUA will remain in effect (meaning this test can be used) for the duration of the COVID-19 declaration under Section 564(b)(1) of the Act, 21 U.S.C. section 360bbb-3(b)(1), unless the authorization is terminated or revoked.  Performed at [facility], [place] [address] [place], [place] [address]   Culture, Urine     Status: Abnormal   Collection Time: 12/29/20  5:00 AM   Specimen: Urine, Clean Catch  Result Value Ref Range Status   Specimen Description   Final    URINE, CLEAN CATCH Performed at [facility], [place] [address], [place], [place] [address]    Special Requests   Final    NONE Performed at [facility], [place] [address] [place], [place] [address]    Culture MULTIPLE SPECIES PRESENT, SUGGEST RECOLLECTION (A)  Final   Report Status 12/30/2020 FINAL  Final         Radiology Studies: No results found.      Scheduled Meds: . amLODipine  10 mg Oral Daily  . apixaban  10 mg Oral BID   Followed by  . [START ON 01/06/2021] apixaban  5 mg Oral BID  . donepezil  10 mg Oral QHS  . polyethylene glycol  17 g Oral BID  . QUEtiapine  50 mg Oral BID  . senna-docusate  2 tablet Oral BID   Continuous Infusions:    LOS: 5 days   Time spent: 25 mins Greater than 50% of this time was spent in counseling, explanation of diagnosis, planning of further management, and coordination of care.  I have personally reviewed and interpreted on  01/01/2021 daily labs, I reviewed all nursing notes, pharmacy notes, consultant notes,  vitals, pertinent old records  I have discussed plan of care as described above with RN , patient and family on 01/01/2021  Voice Recognition /Dragon dictation system was used to create this note, attempts have been made to correct errors. Please contact the author with questions and/or clarifications.   Florencia Reasons, MD PhD FACP [facility]  Available via Epic secure chat 7am-7pm for nonurgent issues Please page for urgent issues To page the attending provider between 7A-7P or the covering provider during after hours 7P-7A, please log into the web site [email or web address] and access using universal [place] password for that web site. If you do not have the password, please call the hospital operator.    01/01/2021, 2:47 PM

## 2021-01-01 NOTE — Plan of Care (Signed)
  Problem: Education: Goal: Knowledge of General Education information will improve Description: Including pain rating scale, medication(s)/side effects and non-pharmacologic comfort measures Outcome: Progressing   Problem: Activity: Goal: Risk for activity intolerance will decrease Outcome: Progressing   Problem: Pain Managment: Goal: General experience of comfort will improve Outcome: Progressing   

## 2021-01-02 DIAGNOSIS — N184 Chronic kidney disease, stage 4 (severe): Secondary | ICD-10-CM | POA: Diagnosis not present

## 2021-01-02 DIAGNOSIS — N179 Acute kidney failure, unspecified: Secondary | ICD-10-CM | POA: Diagnosis not present

## 2021-01-02 DIAGNOSIS — I82402 Acute embolism and thrombosis of unspecified deep veins of left lower extremity: Secondary | ICD-10-CM | POA: Diagnosis not present

## 2021-01-02 DIAGNOSIS — I10 Essential (primary) hypertension: Secondary | ICD-10-CM | POA: Diagnosis not present

## 2021-01-02 LAB — CBC WITH DIFFERENTIAL/PLATELET
Abs Immature Granulocytes: 0.07 10*3/uL (ref 0.00–0.07)
Basophils Absolute: 0.1 10*3/uL (ref 0.0–0.1)
Basophils Relative: 0 %
Eosinophils Absolute: 0.2 10*3/uL (ref 0.0–0.5)
Eosinophils Relative: 2 %
HCT: 37.3 % — ABNORMAL LOW (ref 39.0–52.0)
Hemoglobin: 11.1 g/dL — ABNORMAL LOW (ref 13.0–17.0)
Immature Granulocytes: 1 %
Lymphocytes Relative: 15 %
Lymphs Abs: 1.9 10*3/uL (ref 0.7–4.0)
MCH: 22.2 pg — ABNORMAL LOW (ref 26.0–34.0)
MCHC: 29.8 g/dL — ABNORMAL LOW (ref 30.0–36.0)
MCV: 74.6 fL — ABNORMAL LOW (ref 80.0–100.0)
Monocytes Absolute: 1.1 10*3/uL — ABNORMAL HIGH (ref 0.1–1.0)
Monocytes Relative: 9 %
Neutro Abs: 9.4 10*3/uL — ABNORMAL HIGH (ref 1.7–7.7)
Neutrophils Relative %: 73 %
Platelets: 269 10*3/uL (ref 150–400)
RBC: 5 MIL/uL (ref 4.22–5.81)
RDW: 14.4 % (ref 11.5–15.5)
WBC: 12.8 10*3/uL — ABNORMAL HIGH (ref 4.0–10.5)
nRBC: 0 % (ref 0.0–0.2)

## 2021-01-02 LAB — BASIC METABOLIC PANEL
Anion gap: 8 (ref 5–15)
BUN: 30 mg/dL — ABNORMAL HIGH (ref 8–23)
CO2: 26 mmol/L (ref 22–32)
Calcium: 9 mg/dL (ref 8.9–10.3)
Chloride: 101 mmol/L (ref 98–111)
Creatinine, Ser: 2.07 mg/dL — ABNORMAL HIGH (ref 0.61–1.24)
GFR, Estimated: 31 mL/min — ABNORMAL LOW (ref >60)
Glucose, Bld: 173 mg/dL — ABNORMAL HIGH (ref 70–99)
Potassium: 4.4 mmol/L (ref 3.5–5.1)
Sodium: 135 mmol/L (ref 135–145)

## 2021-01-02 MED ORDER — LORAZEPAM 2 MG/ML IJ SOLN
0.5000 mg | Freq: Three times a day (TID) | INTRAMUSCULAR | Status: DC | PRN
  Administered 2021-01-02: 0.5 mg via INTRAVENOUS
  Filled 2021-01-02: qty 1

## 2021-01-02 NOTE — Progress Notes (Signed)
Physical Therapy Treatment Patient Details Name: [name] [name] MRN: [id number] DOB: 1933-12-03 Today's Date: 01/02/2021    History of Present Illness Pt is an 87 year old male with PMHx significant for prostate cancer, dementia, hypertension, CKD 4, nephrolithiasis who presented to the ED with left lower extremity swelling that started 12/25/2020.  He initially presented to [place] in the evening of 1/3 and was found to have an elevated D-dimer of >20 concerning for DVT.  Unfortunately, ultrasound was unavailable to perform the study at that point in time and so he was given a shot of Lovenox, discharged with Xarelto and told to follow-up with [facility] radiology on January 4, unfortunately ultrasound showed extensive DVT left lower extremity, patient was referred to ED, EDP discussed with IR who recommend admission and IR consultation    PT Comments    Pt pleasantly confused this morning and assisted with ambulating short distance in hallway. Pt's left LE appears less edematous today compared to Friday (last session) and elevated on pillow end of session.  Pt more shaky and requiring min assist for stabilizing today.  Continue to recommend SNF upon d/c.   Follow Up Recommendations  SNF     Equipment Recommendations  None recommended by PT    Recommendations for Other Services       Precautions / Restrictions Precautions Precautions: Fall Restrictions Weight Bearing Restrictions: No    Mobility  Bed Mobility Overal bed mobility: Needs Assistance Bed Mobility: Supine to Sit     Supine to sit: Min guard     General bed mobility comments: required functional cues for mobilizing to EOB  Transfers Overall transfer level: Needs assistance Equipment used: Rolling walker (2 wheeled) Transfers: Sit to/from Stand Sit to Stand: Min assist         General transfer comment: assist to rise and steady, multimodal cues for technique  Ambulation/Gait Ambulation/Gait  assistance: Min assist Gait Distance (Feet): 100 Feet Assistive device: Rolling walker (2 wheeled) Gait Pattern/deviations: Step-through pattern;Decreased stride length Gait velocity: decr   General Gait Details: verbal and visual cues for safe use of RW and staying within RW, pt denies any dizziness, dyspnea or pain; assist for stability today   Stairs             Wheelchair Mobility    Modified Rankin (Stroke Patients Only)       Balance Overall balance assessment: Needs assistance         Standing balance support: Bilateral upper extremity supported Standing balance-Leahy Scale: Poor Standing balance comment: reliant on UE support                            Cognition Arousal/Alertness: Awake/alert Behavior During Therapy: WFL for tasks assessed/performed Overall Cognitive Status: History of cognitive impairments - at baseline                                 General Comments: A x Ox1      Exercises      General Comments        Pertinent Vitals/Pain Pain Assessment: No/denies pain    Home Living                      Prior Function            PT Goals (current goals can now be found in the  care plan section) Progress towards PT goals: Progressing toward goals    Frequency    Min 2X/week      PT Plan Current plan remains appropriate    Co-evaluation              AM-PAC PT "6 Clicks" Mobility   Outcome Measure  Help needed turning from your back to your side while in a flat bed without using bedrails?: A Little Help needed moving from lying on your back to sitting on the side of a flat bed without using bedrails?: A Little Help needed moving to and from a bed to a chair (including a wheelchair)?: A Little Help needed standing up from a chair using your arms (e.g., wheelchair or bedside chair)?: A Little Help needed to walk in hospital room?: A Little Help needed climbing 3-5 steps with a railing?  : A Lot 6 Click Score: 17    End of Session Equipment Utilized During Treatment: Gait belt Activity Tolerance: Patient tolerated treatment well Patient left: in chair;with call bell/phone within reach;with chair alarm set (floor mat in front of chair) Nurse Communication: Mobility status PT Visit Diagnosis: Difficulty in walking, not elsewhere classified (R26.2);Muscle weakness (generalized) (M62.81)     Time: 1050-1105 PT Time Calculation (min) (ACUTE ONLY): 15 min  Charges:  $Gait Training: 8-22 mins                    Jannette Spanner PT, DPT Acute Rehabilitation Services Pager: [phone] Office: [phone]  York Ram E 01/02/2021, 1:53 PM

## 2021-01-02 NOTE — Progress Notes (Signed)
PROGRESS NOTE    [name] [name]  ALP:[id number] DOB: 25-Apr-1933 DOA: 12/27/2020 [facility]CP: [facility]   Chief Complain: Left lower extremity swelling  Brief Narrative:  Patient is a 87 year old male with history of prostate cancer,dementia,hypertension,CKD 4,nephrolithiasiswho presented to the ED with left lower extremity swelling that started 12/25/2020. He initially presented to [facility] in the evening of 1/3 and was found to have an elevated D-dimer of>20concerning for DVT. Unfortunately, ultrasound was unavailable to perform the study at that point in time and so he was given a shot of Lovenox, discharged with Xarelto and told to follow-up with [facility] radiology on January 4, . ultrasound showed extensive DVT left lower extremity, patient was referred to ED, EDP discussed with IR who recommended admission and IR consultation.  IR recommended conservative management and outpatient follow-up in 2 weeks.  He has been started on Eliquis.  Palliative care also consulted for goals of care discussion.  Plan is to discharge him to skilled nursing facility whenever bed is available.  Assessment & Plan:   Principal Problem:   DVT (deep venous thrombosis) (HCC) Active Problems:   HTN (hypertension)   CKD (chronic kidney disease) stage 4, GFR 15-29 ml/min (HCC)   Acute kidney injury superimposed on chronic kidney disease ([place])   Palliative care by specialist   Extensive acute DVT of the left lower extremity: History of prostate cancer, patient is not that mobile.  Found to have extensive clot burden of the left lower extremity.  IR was consulted who recommended conservative management and did not think that he is a candidate for embolectomy because of overall frailty and CKD.  Initially started on heparin drip, now has been started on Eliquis.  IR recommended to follow-up as an outpatient in 2 weeks for reevaluation.  He may be a candidate for IVC filter if he cannot tolerate  anticoagulation.  CKD stage IV: Currently kidney function at baseline.  Avoid nephrotoxins.  Renal ultrasound did not show any hydronephrosis.  Non-insulin-dependent diabetes type 2: Controlled.  Hemoglobin A1c 5.8.  Continue home medications on discharge.  Hypertension: Blood pressure fluctuating.  Continue Norvasc.  Monitor blood pressure  Dementia: At baseline, he is pleasantly confused.  On Aricept and Seroquel  Constipation: Continue bowel regimen.  Failure to thrive/debility/deconditioning: PT recommended skilled nursing facility on discharge.  Waiting for insurance authorization  Goals of care: Multiple comorbidities, dementia.  Palliative care consulted for goals of care, currently remains full code.         DVT prophylaxis:Eliquis Code Status: Full Family Communication: None at bedside Status is: Inpatient  Remains inpatient appropriate because:Ongoing diagnostic testing needed not appropriate for outpatient work up and Unsafe d/c plan   Dispo: The patient is from: Home              Anticipated d/c is to: SNF              Anticipated d/c date is: 1 day              Patient currently is medically stable to d/c.    Consultants: IR,palliative care  Procedures:None  Antimicrobials:  Anti-infectives (From admission, onward)   None      Subjective: Patient seen and examined at bedside this morning.  Hemodynamically stable.  Sitting on the bed.  Denies any complaints.  Objective: Vitals:   01/01/21 0546 01/01/21 1300 01/01/21 1900 01/02/21 0700  BP: (!) 160/85 (!) 146/86 138/61 (!) 183/87  Pulse: 63 63 71 65  Resp: 16 17 17 17   Temp: 98.3 F (36.8 C) 98.3 F (36.8 C) (!) 97 F (36.1 C) 97.9 F (36.6 C)  TempSrc: Oral Oral Axillary Oral  SpO2: 94% 96% 96% 97%  Weight:      Height:        Intake/Output Summary (Last 24 hours) at 01/02/2021 1121 Last data filed at 01/01/2021 1932 Gross per 24 hour  Intake 360 ml  Output 150 ml  Net 210 ml   Filed  Weights   12/27/20 1411 12/27/20 1441  Weight: 102.1 kg 102.1 kg    Examination:  General exam: Appears calm and comfortable ,Not in distress, pleasantly confused HEENT:PERRL,Oral mucosa moist, Ear/Nose normal on gross exam Respiratory system: Bilateral equal air entry, normal vesicular breath sounds, no wheezes or crackles  Cardiovascular system: S1 & S2 heard, RRR. No JVD, murmurs, rubs, gallops or clicks Gastrointestinal system: Abdomen is nondistended, soft and nontender. No organomegaly or masses felt. Normal bowel sounds heard. Central nervous system: Alert and awake.  Knows that today is  Monday Extremities: Left lower extremity edema, no clubbing ,no cyanosis Skin: No rashes, lesions or ulcers,no icterus ,no pallor   Data Reviewed: I have personally reviewed following labs and imaging studies  CBC: Recent Labs  Lab 12/27/20 0318 12/27/20 1426 12/28/20 0446 12/30/20 0359 12/31/20 0335  WBC 12.6* 11.1* 9.7 8.4 8.8  NEUTROABS 9.5* 8.4*  --   --   --   HGB 12.2* 12.2* 10.6* 10.0* 10.6*  HCT 41.4 41.6 35.9* 33.2* 34.6*  MCV 75.4* 75.4* 76.1* 74.1* 73.2*  PLT 247 260 214 231 213   Basic Metabolic Panel: Recent Labs  Lab 12/29/20 0424 12/30/20 0359 12/31/20 0335 01/01/21 0334 01/02/21 0345  NA 138 138 136 137 135  K 4.4 4.5 4.5 4.5 4.4  CL 106 105 101 104 101  CO2 23 24 26 24 26   GLUCOSE 122* 126* 137* 132* 173*  BUN 31* 28* 24* 31* 30*  CREATININE 2.28* 2.23* 2.21* 2.16* 2.07*  CALCIUM 8.8* 8.8* 9.0 9.0 9.0   GFR: Estimated Creatinine Clearance: 32.8 mL/min (A) (by C-G formula based on SCr of 2.07 mg/dL (H)). Liver Function Tests: No results for input(s): AST, ALT, ALKPHOS, BILITOT, PROT, ALBUMIN in the last 168 hours. No results for input(s): LIPASE, AMYLASE in the last 168 hours. No results for input(s): AMMONIA in the last 168 hours. Coagulation Profile: Recent Labs  Lab 12/27/20 0318  INR 1.1   Cardiac Enzymes: No results for input(s): CKTOTAL,  CKMB, CKMBINDEX, TROPONINI in the last 168 hours. BNP (last 3 results) No results for input(s): PROBNP in the last 8760 hours. HbA1C: No results for input(s): HGBA1C in the last 72 hours. CBG: No results for input(s): GLUCAP in the last 168 hours. Lipid Profile: No results for input(s): CHOL, HDL, LDLCALC, TRIG, CHOLHDL, LDLDIRECT in the last 72 hours. Thyroid Function Tests: No results for input(s): TSH, T4TOTAL, FREET4, T3FREE, THYROIDAB in the last 72 hours. Anemia Panel: No results for input(s): VITAMINB12, FOLATE, FERRITIN, TIBC, IRON, RETICCTPCT in the last 72 hours. Sepsis Labs: No results for input(s): PROCALCITON, LATICACIDVEN in the last 168 hours.  Recent Results (from the past 240 hour(s))  Resp Panel by RT-PCR (Flu A&B, Covid) Nasopharyngeal Swab     Status: None   Collection Time: 12/27/20  2:27 PM   Specimen: Nasopharyngeal Swab; Nasopharyngeal(NP) swabs in vial transport medium  Result Value Ref Range Status   SARS Coronavirus 2 by RT PCR NEGATIVE NEGATIVE Final  Comment: (NOTE) SARS-CoV-2 target nucleic acids are NOT DETECTED.  The SARS-CoV-2 RNA is generally detectable in upper respiratory specimens during the acute phase of infection. The lowest concentration of SARS-CoV-2 viral copies this assay can detect is 138 copies/mL. A negative result does not preclude SARS-Cov-2 infection and should not be used as the sole basis for treatment or other patient management decisions. A negative result may occur with  improper specimen collection/handling, submission of specimen other than nasopharyngeal swab, presence of viral mutation(s) within the areas targeted by this assay, and inadequate number of viral copies(<138 copies/mL). A negative result must be combined with clinical observations, patient history, and epidemiological information. The expected result is Negative.  Fact Sheet for Patients:  EntrepreneurPulse.com.au  Fact Sheet for  Healthcare Providers:  IncredibleEmployment.be  This test is no t yet approved or cleared by the [place] FDA and  has been authorized for detection and/or diagnosis of SARS-CoV-2 by FDA under an Emergency Use Authorization (EUA). This EUA will remain  in effect (meaning this test can be used) for the duration of the COVID-19 declaration under Section 564(b)(1) of the Act, 21 U.S.C.section 360bbb-3(b)(1), unless the authorization is terminated  or revoked sooner.       Influenza A by PCR NEGATIVE NEGATIVE Final   Influenza B by PCR NEGATIVE NEGATIVE Final    Comment: (NOTE) The Xpert Xpress SARS-CoV-2/FLU/RSV plus assay is intended as an aid in the diagnosis of influenza from Nasopharyngeal swab specimens and should not be used as a sole basis for treatment. Nasal washings and aspirates are unacceptable for Xpert Xpress SARS-CoV-2/FLU/RSV testing.  Fact Sheet for Patients: EntrepreneurPulse.com.au  Fact Sheet for Healthcare Providers: IncredibleEmployment.be  This test is not yet approved or cleared by the [place] FDA and has been authorized for detection and/or diagnosis of SARS-CoV-2 by FDA under an Emergency Use Authorization (EUA). This EUA will remain in effect (meaning this test can be used) for the duration of the COVID-19 declaration under Section 564(b)(1) of the Act, 21 U.S.C. section 360bbb-3(b)(1), unless the authorization is terminated or revoked.  Performed at [facility], [place] [address], [place], [place] [address]   Culture, Urine     Status: Abnormal   Collection Time: 12/29/20  5:00 AM   Specimen: Urine, Clean Catch  Result Value Ref Range Status   Specimen Description   Final    URINE, CLEAN CATCH Performed at [facility], [place] [address], [place], [place] [address]    Special Requests   Final    NONE Performed at [facility],  [place] [address], [place], [place] [address]    Culture MULTIPLE SPECIES PRESENT, SUGGEST RECOLLECTION (A)  Final   Report Status 12/30/2020 FINAL  Final         Radiology Studies: No results found.      Scheduled Meds: . amLODipine  10 mg Oral Daily  . apixaban  10 mg Oral BID   Followed by  . [START ON 01/06/2021] apixaban  5 mg Oral BID  . donepezil  10 mg Oral QHS  . polyethylene glycol  17 g Oral BID  . QUEtiapine  50 mg Oral BID  . senna-docusate  2 tablet Oral BID   Continuous Infusions:   LOS: 6 days    Time spent:25 mins. More than 50% of that time was spent in counseling and/or coordination of care.      Shelly Coss, MD [facility] P1/09/2021, 11:21 AM

## 2021-01-02 NOTE — Progress Notes (Signed)
   Daily Progress Note   Patient Name: [name] [name]       Date: 01/02/2021 DOB: 1933-09-17  Age: 87 y.o. MRN#: [id number] Attending Physician: Shelly Coss, MD Primary Care Physician: [facility] Admit Date: 12/27/2020  Reason for Consultation/Follow-up: Establishing goals of care  Chart Reviewed.   Updates provided to [name]. She verbalized understanding. She is hopeful he will discharge soon to SNF for rehab with support of outpatient Palliative.   I created an opportunity to further discuss patient's code status. Mrs. Aloia remains torn with her final decision. Education provided on full code vs DNR/DNI (which aligns with patient's previously expressed wishes). Wife shares she has spoken with Mr. Clos again over the weekend and he acknowledge he was ok with DNR. Expressing again "when God takes him he is ready!" Mrs. Granier shares she feels he knows what he is saying but again is reluctant to proceed with final decisions at this time. She does express her feelings that DNR would be more appropriate, however wants to have a complete peace prior to documenting DNR.   Encouraged Mrs. Ruddy to focus on what patient would want and what most supports his quality of life at this stage in his elderly life and health state. She verbalized understanding and appreciation of the continued support.   Unsure if DNR will be agreed upon prior to discharge. Outpatient Palliative support will be essential in offering patient and wife with continued support and navigating with final decisions.   All questions answered and support provided.   Length of Stay: 6 days  Vital Signs: BP (!) 183/87 (BP Location: Right Arm)   Pulse 65   Temp 97.9 F (36.6 C) (Oral)   Resp 17   Ht 6\' 1"  (1.854 m)   Wt 102.1 kg   SpO2 97%   BMI 29.69 kg/m  SpO2: SpO2: 97 % O2 Device: O2 Device: Room Air O2 Flow Rate:            Palliative Care Assessment & Plan    Goals of Care/Recommendations: Remains a  full code as wife continues to navigate final decisions. Patient has previously expressed wishes for no life-sustaining measures or interventions and wife agrees however requesting time for final decisions to allow her comfort in final decision.  Wife hopeful patient will discharge to SNF rehab soon with Outpatient Palliative support.  If unable to obtain DNR prior to discharge encouraged continued discussions with outpatient palliative and the medical team.  PMT will continue to support and follow as needed.   Prognosis: Guarded   Discharge Planning: [facility] for rehab with Palliative care service follow-up  Thank you for allowing the Palliative Medicine Team to assist in the care of this patient.  Time Total: 45 min.   Visit consisted of counseling and education dealing with the complex and emotionally intense issues of symptom management and palliative care in the setting of serious and potentially life-threatening illness.Greater than 50%  of this time was spent counseling and coordinating care related to the above assessment and plan.  Alda Lea, AGPCNP-BC  Palliative Medicine Team [phone]

## 2021-01-02 NOTE — TOC Progression Note (Signed)
Transition of Care [facility]) - Progression Note    Patient Details  Name: Demitris Pokorny MRN: [id number] Date of Birth: [birth year]  Transition of Care [facility]) CM/SW [place], LCSW Phone Number: 01/02/2021, 11:41 AM  Clinical Narrative:    CSW will meet spouse at 1:30pm to SNF discuss bed offers.  [place] authorization initiated. Reference# [id number] Physician notified to order a covid test   Expected Discharge Plan: [place]    Expected Discharge Plan and Services Expected Discharge Plan: [place] In-house Referral: Clinical Social Work   Post Acute Care Choice: [place] Living arrangements for the past 2 months: Single Family Home                                       Social Determinants of Health (SDOH) Interventions    Readmission Risk Interventions No flowsheet data found.

## 2021-01-02 NOTE — TOC Progression Note (Signed)
Transition of Care [facility]) - Progression Note    Patient Details  Name: [name] [name] MRN: [id number] Date of Birth: [birth year]  Transition of Care [facility]) CM/SW [place], [place] Phone Number: 01/02/2021, 4:00 PM  Clinical Narrative:    Spouse chose [facility].  Patient will transport by [place] Physician notified to order a covid test Insurance Authorization pending.    Expected Discharge Plan: [place]    Expected Discharge Plan and Services Expected Discharge Plan: [place] In-house Referral: Clinical Social Work   Post Acute Care Choice: [place] Living arrangements for the past 2 months: Single Family Home                                       Social Determinants of Health (SDOH) Interventions    Readmission Risk Interventions No flowsheet data found.

## 2021-01-02 NOTE — NC FL2 (Signed)
[place] LEVEL OF CARE SCREENING TOOL     IDENTIFICATION  Patient Name: [name] [name] Birthdate: 08-30-1933 Sex: male Admission Date (Current Location): 12/27/2020  [facility] and [place] Number:  Herbalist and Address:  [facility],  [place], [place]      Provider Number: [id number]  Attending Physician Name and Address:  Shelly Coss, MD  Relative Name and Phone Number:  Yandiel, [place] [phone]  [phone]    Current Level of Care: Hospital Recommended Level of Care: [place] Prior Approval Number:   [id number]  Date Approved/Denied:   PASRR Number:    Discharge Plan: SNF    Current Diagnoses: Patient Active Problem List   Diagnosis Date Noted  . Palliative care by specialist   . DVT (deep venous thrombosis) ([place]) 12/27/2020  . Acute kidney injury superimposed on chronic kidney disease ([place]) 03/26/2016  . Syncope and collapse 11/06/2015  . Hypertension 11/06/2015  . Carotid sinus syncope or syndrome 11/06/2015  . Acute renal failure syndrome ([place])   . CKD (chronic kidney disease) stage 4, GFR 15-29 ml/min (HCC) 03/27/2015  . Anemia of chronic disease 03/27/2015  . Syncope 12/02/2011  . HTN (hypertension) 12/02/2011    Orientation RESPIRATION BLADDER Height & Weight     Self  Normal Incontinent Weight: 225 lb (102.1 kg) Height:  6\' 1"  (185.4 cm)  BEHAVIORAL SYMPTOMS/MOOD NEUROLOGICAL BOWEL NUTRITION STATUS      Incontinent Diet (see d/c summary)  AMBULATORY STATUS COMMUNICATION OF NEEDS Skin   Extensive Assist Verbally Normal                       Personal Care Assistance Level of Assistance  Bathing,Feeding,Dressing Bathing Assistance: Maximum assistance Feeding assistance: Limited assistance Dressing Assistance: Maximum assistance     Functional Limitations Info  Sight,Hearing,Speech Sight Info: Adequate Hearing Info: Adequate Speech Info: Adequate     SPECIAL CARE FACTORS FREQUENCY  PT (By licensed PT),OT (By licensed OT)     PT Frequency: 5x/week OT Frequency: 5x/week            Contractures Contractures Info: Not present    Additional Factors Info  Code Status,Allergies,Psychotropic Code Status Info: Fullcode Allergies Info: Allergies: No Known Allergies Psychotropic Info: Seroquel         Current Medications (01/02/2021):  This is the current hospital active medication list Current Facility-Administered Medications  Medication Dose Route Frequency Provider Last Rate Last Admin  . acetaminophen (TYLENOL) tablet 650 mg  650 mg Oral Q6H PRN Harold Hedge, MD       Or  . acetaminophen (TYLENOL) suppository 650 mg  650 mg Rectal Q6H PRN Harold Hedge, MD      . amLODipine (NORVASC) tablet 10 mg  10 mg Oral Daily Harold Hedge, MD   10 mg at 01/02/21 1033  . apixaban (ELIQUIS) tablet 10 mg  10 mg Oral BID Polly Cobia, RPH   10 mg at 01/02/21 1037   Followed by  . [START ON 01/06/2021] apixaban (ELIQUIS) tablet 5 mg  5 mg Oral BID Polly Cobia, RPH      . donepezil (ARICEPT) tablet 10 mg  10 mg Oral QHS Harold Hedge, MD   10 mg at 01/01/21 2111  . furosemide (LASIX) tablet 20 mg  20 mg Oral Daily PRN Harold Hedge, MD      . HYDROcodone-acetaminophen (NORCO/VICODIN) 5-325 MG per tablet 1-2 tablet  1-2  tablet Oral Q4H PRN Harold Hedge, MD      . LORazepam (ATIVAN) tablet 0.5 mg  0.5 mg Oral Q8H PRN Harold Hedge, MD      . polyethylene glycol (MIRALAX / GLYCOLAX) packet 17 g  17 g Oral BID Florencia Reasons, MD   17 g at 01/02/21 1033  . QUEtiapine (SEROQUEL) tablet 50 mg  50 mg Oral BID Harold Hedge, MD   50 mg at 01/02/21 1034  . senna-docusate (Senokot-S) tablet 2 tablet  2 tablet Oral BID Florencia Reasons, MD   2 tablet at 01/02/21 1034     Discharge Medications: Please see discharge summary for a list of discharge medications.  Relevant Imaging Results:  Relevant Lab Results:   Additional  Information ssn:[id number]  Lia Hopping, LCSW

## 2021-01-02 NOTE — Progress Notes (Signed)
Pt has large bruised area starting at umbilicus going around his right side to his back. Has a white area with a knot in it. Wife stated he received a shot there on Monday at the emergency room in [facility].  M[place] notified.

## 2021-01-02 NOTE — Discharge Instructions (Signed)
Information on my medicine - ELIQUIS (apixaban)  This medication education was reviewed with me or my healthcare representative as part of my discharge preparation.  The pharmacist that spoke with me during my hospital stay was:  Napoleon Form [facility]  Why was Eliquis prescribed for you? Eliquis was prescribed to treat blood clots that may have been found in the veins of your legs (deep vein thrombosis) or in your lungs (pulmonary embolism) and to reduce the risk of them occurring again.  What do You need to know about Eliquis ? The starting dose is 10 mg (two 5 mg tablets) taken TWICE daily for the FIRST SEVEN (7) DAYS, then on  01/06/21  the dose is reduced to ONE 5 mg tablet taken TWICE daily.  Eliquis may be taken with or without food.   Try to take the dose about the same time in the morning and in the evening. If you have difficulty swallowing the tablet whole please discuss with your pharmacist how to take the medication safely.  Take Eliquis exactly as prescribed and DO NOT stop taking Eliquis without talking to the doctor who prescribed the medication.  Stopping may increase your risk of developing a new blood clot.  Refill your prescription before you run out.  After discharge, you should have regular check-up appointments with your healthcare provider that is prescribing your Eliquis.    What do you do if you miss a dose? If a dose of ELIQUIS is not taken at the scheduled time, take it as soon as possible on the same day and twice-daily administration should be resumed. The dose should not be doubled to make up for a missed dose.  Important Safety Information A possible side effect of Eliquis is bleeding. You should call your healthcare provider right away if you experience any of the following: ? Bleeding from an injury or your nose that does not stop. ? Unusual colored urine (red or dark brown) or unusual colored stools (red or black). ? Unusual bruising for unknown  reasons. ? A serious fall or if you hit your head (even if there is no bleeding).  Some medicines may interact with Eliquis and might increase your risk of bleeding or clotting while on Eliquis. To help avoid this, consult your healthcare provider or pharmacist prior to using any new prescription or non-prescription medications, including herbals, vitamins, non-steroidal anti-inflammatory drugs (NSAIDs) and supplements.  This website has more information on Eliquis (apixaban): http://www.eliquis.com/eliquis/home

## 2021-01-03 DIAGNOSIS — I82422 Acute embolism and thrombosis of left iliac vein: Secondary | ICD-10-CM | POA: Diagnosis not present

## 2021-01-03 DIAGNOSIS — I82402 Acute embolism and thrombosis of unspecified deep veins of left lower extremity: Secondary | ICD-10-CM | POA: Diagnosis not present

## 2021-01-03 LAB — SARS CORONAVIRUS 2 (TAT 6-24 HRS): SARS Coronavirus 2: NEGATIVE

## 2021-01-03 MED ORDER — SENNOSIDES-DOCUSATE SODIUM 8.6-50 MG PO TABS: 2.0000 | ORAL_TABLET | Freq: Two times a day (BID) | ORAL | Status: DC

## 2021-01-03 MED ORDER — LORAZEPAM 0.5 MG PO TABS: 0.5000 mg | ORAL_TABLET | Freq: Three times a day (TID) | ORAL | 0 refills | Status: DC | PRN

## 2021-01-03 MED ORDER — INFLUENZA VAC A&B SA ADJ QUAD 0.5 ML IM PRSY: 0.5000 mL | PREFILLED_SYRINGE | INTRAMUSCULAR | Status: DC

## 2021-01-03 MED ORDER — APIXABAN 5 MG PO TABS: 5.0000 mg | ORAL_TABLET | Freq: Two times a day (BID) | ORAL | Status: AC

## 2021-01-03 MED ORDER — INFLUENZA VAC A&B SA ADJ QUAD 0.5 ML IM PRSY
0.5000 mL | PREFILLED_SYRINGE | Freq: Once | INTRAMUSCULAR | Status: AC
  Administered 2021-01-03: 0.5 mL via INTRAMUSCULAR
  Filled 2021-01-03: qty 0.5

## 2021-01-03 MED ORDER — APIXABAN 5 MG PO TABS: 10.0000 mg | ORAL_TABLET | Freq: Two times a day (BID) | ORAL | 0 refills | Status: DC

## 2021-01-03 MED ORDER — POLYETHYLENE GLYCOL 3350 17 G PO PACK: 17.0000 g | PACK | Freq: Every day | ORAL | 0 refills | Status: DC | PRN

## 2021-01-03 NOTE — Discharge Summary (Signed)
Physician Discharge Summary  [name] [name] KGM:[id number] DOB: 1933/09/02 DOA: 12/27/2020  PCP: [facility]  Admit date: 12/27/2020 Discharge date: 01/03/2021  Admitted From: Home Disposition:  SNF Discharge Condition:Stable CODE STATUS:FULL Diet recommendation: Heart Healthy  Brief/Interim Summary:  Patient is a 87 year old male with history of prostate cancer,dementia,hypertension,CKD 4,nephrolithiasiswho presented to the ED with left lower extremity swelling that started 12/25/2020. He initially presented to [facility] in the evening of 1/3 and was found to have an elevated D-dimer of>20concerning for DVT. Unfortunately, ultrasound was unavailable to perform the study at that point in time and so he was given a shot of Lovenox, discharged with Xarelto and told to follow-up with [facility] radiology on January 4, . ultrasound showed extensive DVT left lower extremity, patient was referred to ED, EDP discussed with IR who recommended admission and IR consultation.  IR recommended conservative management and outpatient follow-up in 2 weeks.  He has been started on Eliquis.  He is medically stable for  discharge  to skilled nursing facility today.  Following problems were addressed during his hospitalization:  Extensive acute DVT of the left lower extremity: History of prostate cancer, patient is not that mobile.  Found to have extensive clot burden of the left lower extremity.  IR was consulted who recommended conservative management and did not think that he is a candidate for embolectomy because of overall frailty and CKD.  Initially started on heparin drip, now has been started on Eliquis.  IR recommended to follow-up as an outpatient in 2 weeks for reevaluation.  He may be a candidate for IVC filter if he cannot tolerate anticoagulation.  CKD stage IV: Currently kidney function at baseline.  Avoid nephrotoxins.  Renal ultrasound did not show any  hydronephrosis.  Non-insulin-dependent diabetes type 2: Controlled.  Hemoglobin A1c 5.8.  Continue home medications on discharge.  Hypertension:   Continue Norvasc.  Monitor blood pressure  Dementia: At baseline, he is pleasantly confused.  On Aricept and Seroquel  Constipation: Continue bowel regimen.  Failure to thrive/debility/deconditioning: PT recommended skilled nursing facility on discharge.    Goals of care: Multiple comorbidities, dementia.  Palliative care consulted for goals of care, currently remains full code.  Abdominal bruise: Most likely secondary to Lovenox shot.  Low suspicion for underlying hematoma.  Hemoglobin stable.  No further work-up needed   Discharge Diagnoses:  Principal Problem:   DVT (deep venous thrombosis) (HCC) Active Problems:   HTN (hypertension)   CKD (chronic kidney disease) stage 4, GFR 15-29 ml/min (HCC)   Acute kidney injury superimposed on chronic kidney disease ([place])   Palliative care by specialist    Discharge Instructions  Discharge Instructions    Diet - low sodium heart healthy   Complete by: As directed    Discharge instructions   Complete by: As directed    1)Please take prescribed medications as instructed 2)Follow up with interventional radiology as an outpatient in 2 weeks.name and  number of the provider  has been attached   Increase activity slowly   Complete by: As directed      Allergies as of 01/03/2021   No Known Allergies     Medication List    STOP taking these medications   aspirin EC 81 MG tablet   Rivaroxaban Stater Pack (15 mg and 20 mg) Commonly known as: XARELTO STARTER PACK     TAKE these medications   amLODipine 10 MG tablet Commonly known as: NORVASC Take 10 mg by mouth daily.  apixaban 5 MG Tabs tablet Commonly known as: ELIQUIS Take 2 tablets (10 mg total) by mouth 2 (two) times daily for 3 days.   apixaban 5 MG Tabs tablet Commonly known as: ELIQUIS Take 1 tablet (5 mg total)  by mouth 2 (two) times daily. Start taking on: January 06, 2021   diclofenac Sodium 1 % Gel Commonly known as: VOLTAREN Apply 4 g topically 4 (four) times daily as needed (pain).   donepezil 10 MG tablet Commonly known as: ARICEPT Take 10 mg by mouth every morning.   ferrous sulfate 325 (65 FE) MG tablet Take 325 mg by mouth every Monday, Wednesday, and Friday.   furosemide 20 MG tablet Commonly known as: LASIX Take 20 mg by mouth daily as needed for fluid.   glipiZIDE 5 MG tablet Commonly known as: GLUCOTROL Take 5 mg by mouth 2 (two) times daily before a meal.   LORazepam 0.5 MG tablet Commonly known as: ATIVAN Take 1 tablet (0.5 mg total) by mouth every 8 (eight) hours as needed for anxiety.   mirabegron ER 50 MG Tb24 tablet Commonly known as: MYRBETRIQ Take 50 mg by mouth daily.   Multivitamin Adult Chew Chew 1 tablet by mouth daily.   polyethylene glycol 17 g packet Commonly known as: MIRALAX / GLYCOLAX Take 17 g by mouth daily as needed.   QUEtiapine 25 MG tablet Commonly known as: SEROQUEL Take 50 mg by mouth 2 (two) times daily.   senna-docusate 8.6-50 MG tablet Commonly known as: Senokot-S Take 2 tablets by mouth 2 (two) times daily.   VITAMIN B 12 PO Take 500 mcg by mouth daily.   Vitamin D3 50 MCG (2000 UT) Tabs Take 2,000 Units by mouth daily.       Contact information for follow-up providers    [facility] Follow up.   Specialty: General Practice Contact information: [place] [address] [phone]        Suzette Battiest, MD Follow up in 2 week(s).   Specialties: Interventional Radiology, Diagnostic Radiology, Radiology Why: left lower extremity deep vein thrombosis, extending centrally at least to the external iliac vein Contact information: [place] [address] [phone]            Contact information for after-discharge care    Destination    [facility]  Preferred SNF .   Service: Skilled Nursing Contact information: [place] [phone]                 No Known Allergies  Consultations:  Ir   Procedures/Studies: MR ANGIO PELVIS WO CONTRAST  Result Date: 12/27/2020 CLINICAL DATA:  Deep venous thrombosis EXAM: MRA ABDOMEN AND PELVIS WITHOUT CONTRAST TECHNIQUE: Multiplanar, multiecho pulse sequences of the abdomen and pelvis were obtained without intravenous contrast. Angiographic images of abdomen and pelvis were obtained using MRA technique without intravenous contrast. CONTRAST:  None COMPARISON:  None. FINDINGS: MRA ABDOMEN FINDINGS The examination is limited by motion artifact. Visualized lower extremity arterial inflow and outflow is widely patent. Internal iliac arteries are patent bilaterally. There is T1 peripheral hyperintense, T2 hypointense expansile soft tissue within the left common femoral vein, as well as the proximal and mid femoral vein in keeping with acute DVT. The left external iliac vein and common iliac veins are patent. The inferior vena cava is patent. On the right, the iliofemoral venous system is patent. MRI PELVIS FINDINGS Normal marrow signal activity. Visualized  bowel is unremarkable. Prostate gland absent. Bladder is unremarkable. Rectum is unremarkable. No pathologic adenopathy within the pelvis. Numerous simple cortical cysts are seen within the kidneys bilaterally, the largest seen on the right and incompletely visualized but measuring at least 9.9 cm in greatest dimension. Superimposed bilateral renal cortical atrophy, right greater than left. Cholelithiasis noted. Extensive subcutaneous edema within the visualized left lower extremity IMPRESSION: Extensive acute DVT within the left common femoral vein and visualized central femoral vein. No ileo caval DVT. Extensive subcutaneous edema involving the visualized proximal left lower extremity. Cholelithiasis  without superimposed inflammatory change. Renal cortical atrophy with superimposed innumerable renal cortical cysts identified. Electronically Signed   By: Fidela Salisbury MD   On: 12/27/2020 22:39   MR ANGIO ABDOMEN WO CONTRAST  Result Date: 12/27/2020 CLINICAL DATA:  Deep venous thrombosis EXAM: MRA ABDOMEN AND PELVIS WITHOUT CONTRAST TECHNIQUE: Multiplanar, multiecho pulse sequences of the abdomen and pelvis were obtained without intravenous contrast. Angiographic images of abdomen and pelvis were obtained using MRA technique without intravenous contrast. CONTRAST:  None COMPARISON:  None. FINDINGS: MRA ABDOMEN FINDINGS The examination is limited by motion artifact. Visualized lower extremity arterial inflow and outflow is widely patent. Internal iliac arteries are patent bilaterally. There is T1 peripheral hyperintense, T2 hypointense expansile soft tissue within the left common femoral vein, as well as the proximal and mid femoral vein in keeping with acute DVT. The left external iliac vein and common iliac veins are patent. The inferior vena cava is patent. On the right, the iliofemoral venous system is patent. MRI PELVIS FINDINGS Normal marrow signal activity. Visualized bowel is unremarkable. Prostate gland absent. Bladder is unremarkable. Rectum is unremarkable. No pathologic adenopathy within the pelvis. Numerous simple cortical cysts are seen within the kidneys bilaterally, the largest seen on the right and incompletely visualized but measuring at least 9.9 cm in greatest dimension. Superimposed bilateral renal cortical atrophy, right greater than left. Cholelithiasis noted. Extensive subcutaneous edema within the visualized left lower extremity IMPRESSION: Extensive acute DVT within the left common femoral vein and visualized central femoral vein. No ileo caval DVT. Extensive subcutaneous edema involving the visualized proximal left lower extremity. Cholelithiasis without superimposed inflammatory  change. Renal cortical atrophy with superimposed innumerable renal cortical cysts identified. Electronically Signed   By: Fidela Salisbury MD   On: 12/27/2020 22:39   US RENAL  Result Date: 12/28/2020 CLINICAL DATA:  Elevated creatinine EXAM: RENAL / URINARY TRACT ULTRASOUND COMPLETE COMPARISON:  CT 01/05/2015 FINDINGS: Right Kidney: Renal measurements: 22.3 x 12.4 x 9.7 cm = volume: 1399 mL. Only limited amount of visible cortex. No hydronephrosis. Numerous, greater than 10 cysts throughout the right renal parenchyma. The largest cysts are seen in the upper pole measuring 10.6 x 9.2 x 9 cm and at the lower pole measuring 5.9 x 5.9 x 5.9 cm. Left Kidney: Renal measurements: 11.6 x 6.5 x 7.4 cm = volume: 293 mL. Only limited visible cortex. Cortex is echogenic. No hydronephrosis. Multiple cysts, fewer than 10. The largest are seen in the lower pole and measure 4.6 x 3.6 x 4.5 cm and 3.3 x 3.2 x 3.8 cm. Bladder: Appears normal for degree of bladder distention. Other: None. IMPRESSION: 1. Negative for hydronephrosis. 2. Multiple cysts within the right greater than left kidney. 3. Echogenic left renal cortex consistent with medical renal disease. Electronically Signed   By: Donavan Foil M.D.   On: 12/28/2020 17:59   VAS [place] LOWER EXTREMITY VENOUS (DVT)  Result Date: 12/27/2020  Lower Venous  DVT Study Indications: Edema.  Comparison Study: No previous exam. Performing Technologist: Vonzell Schlatter RVT  Examination Guidelines: A complete evaluation includes B-mode imaging, spectral Doppler, color Doppler, and power Doppler as needed of all accessible portions of each vessel. Bilateral testing is considered an integral part of a complete examination. Limited examinations for reoccurring indications may be performed as noted. The reflux portion of the exam is performed with the patient in reverse Trendelenburg.  +-----+---------------+---------+-----------+----------+--------------+  RIGHTCompressibilityPhasicitySpontaneityPropertiesThrombus Aging +-----+---------------+---------+-----------+----------+--------------+ CFV  Full           Yes      Yes                                 +-----+---------------+---------+-----------+----------+--------------+ SFJ  Full                                                        +-----+---------------+---------+-----------+----------+--------------+   +---------+---------------+---------+-----------+----------+--------------+ LEFT     CompressibilityPhasicitySpontaneityPropertiesThrombus Aging +---------+---------------+---------+-----------+----------+--------------+ CFV      None           No       No                                  +---------+---------------+---------+-----------+----------+--------------+ SFJ      None                                                        +---------+---------------+---------+-----------+----------+--------------+ FV Prox  None                                                        +---------+---------------+---------+-----------+----------+--------------+ FV Mid   None                                                        +---------+---------------+---------+-----------+----------+--------------+ FV DistalNone                                                        +---------+---------------+---------+-----------+----------+--------------+ PFV      None                                                        +---------+---------------+---------+-----------+----------+--------------+ POP      None           No       No                                  +---------+---------------+---------+-----------+----------+--------------+  PTV      None                                                        +---------+---------------+---------+-----------+----------+--------------+ PERO                                                  Not  visualized +---------+---------------+---------+-----------+----------+--------------+ Gastroc  None                                                        +---------+---------------+---------+-----------+----------+--------------+   Left Technical Findings: Not visualized segments include Peroneal veins.   Summary: RIGHT: - No evidence of deep vein thrombosis in the lower extremity. No indirect evidence of obstruction proximal to the inguinal ligament.  LEFT: - Findings consistent with acute deep vein thrombosis involving the left common femoral vein, SF junction, left femoral vein, left proximal profunda vein, left popliteal vein, left posterior tibial veins, and left gastrocnemius veins. - Acute DVT in left EIV.  *See table(s) above for measurements and observations. Electronically signed by Curt Jews MD on 12/27/2020 at 1:59:41 PM.    Final        Subjective: Patient seen and examined at bedside this morning.  Hemodynamically stable for discharge today.  Discharge Exam: Vitals:   01/02/21 2105 01/03/21 0520  BP: (!) 163/83 (!) 156/92  Pulse: 90 68  Resp: 17 18  Temp: 98.7 F (37.1 C) 98 F (36.7 C)  SpO2: 96% 100%   Vitals:   01/02/21 0700 01/02/21 1726 01/02/21 2105 01/03/21 0520  BP: (!) 183/87 (!) 147/84 (!) 163/83 (!) 156/92  Pulse: 65 83 90 68  Resp: 17 18 17 18   Temp: 97.9 F (36.6 C) 98.5 F (36.9 C) 98.7 F (37.1 C) 98 F (36.7 C)  TempSrc: Oral  Oral Oral  SpO2: 97% 98% 96% 100%  Weight:      Height:        General: Pt is alert, awake, not in acute distress Cardiovascular: RRR, S1/S2 +, no rubs, no gallops Respiratory: CTA bilaterally, no wheezing, no rhonchi Abdominal: Soft, NT, ND, bowel sounds + Extremities: no edema, no cyanosis    The results of significant diagnostics from this hospitalization (including imaging, microbiology, ancillary and laboratory) are listed below for reference.     Microbiology: Recent Results (from the past 240 hour(s))   Resp Panel by RT-PCR (Flu A&B, Covid) Nasopharyngeal Swab     Status: None   Collection Time: 12/27/20  2:27 PM   Specimen: Nasopharyngeal Swab; Nasopharyngeal(NP) swabs in vial transport medium  Result Value Ref Range Status   SARS Coronavirus 2 by RT PCR NEGATIVE NEGATIVE Final    Comment: (NOTE) SARS-CoV-2 target nucleic acids are NOT DETECTED.  The SARS-CoV-2 RNA is generally detectable in upper respiratory specimens during the acute phase of infection. The lowest concentration of SARS-CoV-2 viral copies this assay can detect is 138 copies/mL. A negative result does not preclude SARS-Cov-2 infection and should not be used as the sole basis  for treatment or other patient management decisions. A negative result may occur with  improper specimen collection/handling, submission of specimen other than nasopharyngeal swab, presence of viral mutation(s) within the areas targeted by this assay, and inadequate number of viral copies(<138 copies/mL). A negative result must be combined with clinical observations, patient history, and epidemiological information. The expected result is Negative.  Fact Sheet for Patients:  EntrepreneurPulse.com.au  Fact Sheet for Healthcare Providers:  IncredibleEmployment.be  This test is no t yet approved or cleared by the [place] FDA and  has been authorized for detection and/or diagnosis of SARS-CoV-2 by FDA under an Emergency Use Authorization (EUA). This EUA will remain  in effect (meaning this test can be used) for the duration of the COVID-19 declaration under Section 564(b)(1) of the Act, 21 U.S.C.section 360bbb-3(b)(1), unless the authorization is terminated  or revoked sooner.       Influenza A by PCR NEGATIVE NEGATIVE Final   Influenza B by PCR NEGATIVE NEGATIVE Final    Comment: (NOTE) The Xpert Xpress SARS-CoV-2/FLU/RSV plus assay is intended as an aid in the diagnosis of influenza from  Nasopharyngeal swab specimens and should not be used as a sole basis for treatment. Nasal washings and aspirates are unacceptable for Xpert Xpress SARS-CoV-2/FLU/RSV testing.  Fact Sheet for Patients: EntrepreneurPulse.com.au  Fact Sheet for Healthcare Providers: IncredibleEmployment.be  This test is not yet approved or cleared by the [place] FDA and has been authorized for detection and/or diagnosis of SARS-CoV-2 by FDA under an Emergency Use Authorization (EUA). This EUA will remain in effect (meaning this test can be used) for the duration of the COVID-19 declaration under Section 564(b)(1) of the Act, 21 U.S.C. section 360bbb-3(b)(1), unless the authorization is terminated or revoked.  Performed at [facility], [place] [address], [place], [place] [address]   Culture, Urine     Status: Abnormal   Collection Time: 12/29/20  5:00 AM   Specimen: Urine, Clean Catch  Result Value Ref Range Status   Specimen Description   Final    URINE, CLEAN CATCH Performed at [facility], [place] [address], [place], [place] [address]    Special Requests   Final    NONE Performed at [facility], [place] [address], [place],  [address]    Culture MULTIPLE SPECIES PRESENT, SUGGEST RECOLLECTION (A)  Final   Report Status 12/30/2020 FINAL  Final  SARS CORONAVIRUS 2 (TAT 6-24 HRS) Nasopharyngeal Nasopharyngeal Swab     Status: None   Collection Time: 01/02/21  3:00 PM   Specimen: Nasopharyngeal Swab  Result Value Ref Range Status   SARS Coronavirus 2 NEGATIVE NEGATIVE Final    Comment: (NOTE) SARS-CoV-2 target nucleic acids are NOT DETECTED.  The SARS-CoV-2 RNA is generally detectable in upper and lower respiratory specimens during the acute phase of infection. Negative results do not preclude SARS-CoV-2 infection, do not rule out co-infections with other pathogens, and should not be used as  the sole basis for treatment or other patient management decisions. Negative results must be combined with clinical observations, patient history, and epidemiological information. The expected result is Negative.  Fact Sheet for Patients: SugarRoll.be  Fact Sheet for Healthcare Providers: https://www.woods-mathews.com/  This test is not yet approved or cleared by the [place] FDA and  has been authorized for detection and/or diagnosis of SARS-CoV-2 by FDA under an Emergency Use Authorization (EUA). This EUA will remain  in effect (meaning this test can be used) for the duration  of the COVID-19 declaration under Se ction 564(b)(1) of the Act, 21 U.S.C. section 360bbb-3(b)(1), unless the authorization is terminated or revoked sooner.  Performed at [facility], [place] [address] [place], [place] [address]      Labs: BNP (last 3 results) No results for input(s): BNP in the last 8760 hours. Basic Metabolic Panel: Recent Labs  Lab 12/29/20 0424 12/30/20 0359 12/31/20 0335 01/01/21 0334 01/02/21 0345  NA 138 138 136 137 135  K 4.4 4.5 4.5 4.5 4.4  CL 106 105 101 104 101  CO2 23 24 26 24 26   GLUCOSE 122* 126* 137* 132* 173*  BUN 31* 28* 24* 31* 30*  CREATININE 2.28* 2.23* 2.21* 2.16* 2.07*  CALCIUM 8.8* 8.8* 9.0 9.0 9.0   Liver Function Tests: No results for input(s): AST, ALT, ALKPHOS, BILITOT, PROT, ALBUMIN in the last 168 hours. No results for input(s): LIPASE, AMYLASE in the last 168 hours. No results for input(s): AMMONIA in the last 168 hours. CBC: Recent Labs  Lab 12/27/20 1426 12/28/20 0446 12/30/20 0359 12/31/20 0335 01/02/21 1733  WBC 11.1* 9.7 8.4 8.8 12.8*  NEUTROABS 8.4*  --   --   --  9.4*  HGB 12.2* 10.6* 10.0* 10.6* 11.1*  HCT 41.6 35.9* 33.2* 34.6* 37.3*  MCV 75.4* 76.1* 74.1* 73.2* 74.6*  PLT 260 214 231 235 269   Cardiac Enzymes: No results for input(s): CKTOTAL, CKMB, CKMBINDEX, TROPONINI in  the last 168 hours. BNP: Invalid input(s): POCBNP CBG: No results for input(s): GLUCAP in the last 168 hours. D-Dimer No results for input(s): DDIMER in the last 72 hours. Hgb A1c No results for input(s): HGBA1C in the last 72 hours. Lipid Profile No results for input(s): CHOL, HDL, LDLCALC, TRIG, CHOLHDL, LDLDIRECT in the last 72 hours. Thyroid function studies No results for input(s): TSH, T4TOTAL, T3FREE, THYROIDAB in the last 72 hours.  Invalid input(s): FREET3 Anemia work up No results for input(s): VITAMINB12, FOLATE, FERRITIN, TIBC, IRON, RETICCTPCT in the last 72 hours. Urinalysis    Component Value Date/Time   COLORURINE YELLOW 12/29/2020 0500   APPEARANCEUR CLEAR 12/29/2020 0500   LABSPEC 1.013 12/29/2020 0500   PHURINE 6.0 12/29/2020 0500   GLUCOSEU NEGATIVE 12/29/2020 0500   HGBUR NEGATIVE 12/29/2020 0500   BILIRUBINUR NEGATIVE 12/29/2020 0500   KETONESUR NEGATIVE 12/29/2020 0500   PROTEINUR NEGATIVE 12/29/2020 0500   UROBILINOGEN 0.2 11/06/2015 1730   NITRITE NEGATIVE 12/29/2020 0500   LEUKOCYTESUR NEGATIVE 12/29/2020 0500   Sepsis Labs Invalid input(s): PROCALCITONIN,  WBC,  LACTICIDVEN Microbiology Recent Results (from the past 240 hour(s))  Resp Panel by RT-PCR (Flu A&B, Covid) Nasopharyngeal Swab     Status: None   Collection Time: 12/27/20  2:27 PM   Specimen: Nasopharyngeal Swab; Nasopharyngeal(NP) swabs in vial transport medium  Result Value Ref Range Status   SARS Coronavirus 2 by RT PCR NEGATIVE NEGATIVE Final    Comment: (NOTE) SARS-CoV-2 target nucleic acids are NOT DETECTED.  The SARS-CoV-2 RNA is generally detectable in upper respiratory specimens during the acute phase of infection. The lowest concentration of SARS-CoV-2 viral copies this assay can detect is 138 copies/mL. A negative result does not preclude SARS-Cov-2 infection and should not be used as the sole basis for treatment or other patient management decisions. A negative result  may occur with  improper specimen collection/handling, submission of specimen other than nasopharyngeal swab, presence of viral mutation(s) within the areas targeted by this assay, and inadequate number of viral copies(<138 copies/mL). A negative  result must be combined with clinical observations, patient history, and epidemiological information. The expected result is Negative.  Fact Sheet for Patients:  EntrepreneurPulse.com.au  Fact Sheet for Healthcare Providers:  IncredibleEmployment.be  This test is no t yet approved or cleared by the [place] FDA and  has been authorized for detection and/or diagnosis of SARS-CoV-2 by FDA under an Emergency Use Authorization (EUA). This EUA will remain  in effect (meaning this test can be used) for the duration of the COVID-19 declaration under Section 564(b)(1) of the Act, 21 U.S.C.section 360bbb-3(b)(1), unless the authorization is terminated  or revoked sooner.       Influenza A by PCR NEGATIVE NEGATIVE Final   Influenza B by PCR NEGATIVE NEGATIVE Final    Comment: (NOTE) The Xpert Xpress SARS-CoV-2/FLU/RSV plus assay is intended as an aid in the diagnosis of influenza from Nasopharyngeal swab specimens and should not be used as a sole basis for treatment. Nasal washings and aspirates are unacceptable for Xpert Xpress SARS-CoV-2/FLU/RSV testing.  Fact Sheet for Patients: EntrepreneurPulse.com.au  Fact Sheet for Healthcare Providers: IncredibleEmployment.be  This test is not yet approved or cleared by the [place] FDA and has been authorized for detection and/or diagnosis of SARS-CoV-2 by FDA under an Emergency Use Authorization (EUA). This EUA will remain in effect (meaning this test can be used) for the duration of the COVID-19 declaration under Section 564(b)(1) of the Act, 21 U.S.C. section 360bbb-3(b)(1), unless the authorization is terminated  or revoked.  Performed at [facility], [place] [address] [place], [place] [address]   Culture, Urine     Status: Abnormal   Collection Time: 12/29/20  5:00 AM   Specimen: Urine, Clean Catch  Result Value Ref Range Status   Specimen Description   Final    URINE, CLEAN CATCH Performed at [facility], [place] [address], [place], [place] [address]    Special Requests   Final    NONE Performed at [facility], [place] [address], [place], [place] [address]    Culture MULTIPLE SPECIES PRESENT, SUGGEST RECOLLECTION (A)  Final   Report Status 12/30/2020 FINAL  Final  SARS CORONAVIRUS 2 (TAT 6-24 HRS) Nasopharyngeal Nasopharyngeal Swab     Status: None   Collection Time: 01/02/21  3:00 PM   Specimen: Nasopharyngeal Swab  Result Value Ref Range Status   SARS Coronavirus 2 NEGATIVE NEGATIVE Final    Comment: (NOTE) SARS-CoV-2 target nucleic acids are NOT DETECTED.  The SARS-CoV-2 RNA is generally detectable in upper and lower respiratory specimens during the acute phase of infection. Negative results do not preclude SARS-CoV-2 infection, do not rule out co-infections with other pathogens, and should not be used as the sole basis for treatment or other patient management decisions. Negative results must be combined with clinical observations, patient history, and epidemiological information. The expected result is Negative.  Fact Sheet for Patients: SugarRoll.be  Fact Sheet for Healthcare Providers: https://www.woods-mathews.com/  This test is not yet approved or cleared by the [place] FDA and  has been authorized for detection and/or diagnosis of SARS-CoV-2 by FDA under an Emergency Use Authorization (EUA). This EUA will remain  in effect (meaning this test can be used) for the duration of the COVID-19 declaration under Se ction 564(b)(1) of the Act, 21 U.S.C. section 360bbb-3(b)(1),  unless the authorization is terminated or revoked sooner.  Performed at [facility], [place] [address] [place], [place] [address]     Please note: You  were cared for by a hospitalist during your hospital stay. Once you are discharged, your primary care physician will handle any further medical issues. Please note that NO REFILLS for any discharge medications will be authorized once you are discharged, as it is imperative that you return to your primary care physician (or establish a relationship with a primary care physician if you do not have one) for your post hospital discharge needs so that they can reassess your need for medications and monitor your lab values.    Time coordinating discharge: 40 minutes  SIGNED:   Shelly Coss, MD  [facility] 01/03/2021, 11:03 AM Pager [phone]  If 7PM-7AM, please contact night-coverage [email or web address] Password [email or web address]

## 2021-01-03 NOTE — Plan of Care (Signed)
  Problem: Education: Goal: Knowledge of General Education information will improve Description: Including pain rating scale, medication(s)/side effects and non-pharmacologic comfort measures Outcome: Progressing   Problem: Health Behavior/Discharge Planning: Goal: Ability to manage health-related needs will improve Outcome: Progressing   Problem: Clinical Measurements: Goal: Ability to maintain clinical measurements within normal limits will improve Outcome: Progressing Goal: Will remain free from infection Outcome: Progressing Goal: Diagnostic test results will improve Outcome: Progressing Goal: Respiratory complications will improve Outcome: Progressing Goal: Cardiovascular complication will be avoided Outcome: Progressing   Problem: Activity: Goal: Risk for activity intolerance will decrease Outcome: Progressing   Problem: Coping: Goal: Level of anxiety will decrease Outcome: Progressing   Problem: Elimination: Goal: Will not experience complications related to bowel motility Outcome: Progressing   Problem: Pain Managment: Goal: General experience of comfort will improve Outcome: Progressing   Problem: Skin Integrity: Goal: Risk for impaired skin integrity will decrease Outcome: Progressing   Problem: Safety: Goal: Ability to remain free from injury will improve Outcome: Progressing   Problem: Consults Goal: Venous Thromboembolism Patient Education Description: See Patient Education Module for education specifics. Outcome: Progressing Goal: Diagnosis - Venous Thromboembolism (VTE) Description: Choose a selection Outcome: Progressing Goal: Nutrition Consult-if indicated Outcome: Progressing Goal: Diabetes Guidelines if Diabetic/Glucose > 140 Description: If diabetic or lab glucose is > 140 mg/dl - Initiate Diabetes/Hyperglycemia Guidelines & Document Interventions  Outcome: Progressing   Problem: Phase I Progression Outcomes Goal: Initial discharge plan  identified Outcome: Progressing Goal: Hemodynamically stable Outcome: Progressing Goal: Other Phase I Outcomes/Goals Outcome: Progressing   Problem: Phase II Progression Outcomes Goal: Therapeutic drug levels for anticoagulation Outcome: Progressing Goal: Discharge plan established Outcome: Progressing Goal: Tolerating diet Outcome: Progressing Goal: Other Phase II Outcomes/Goals Outcome: Progressing   Problem: Phase III Progression Outcomes Goal: 02 sats stabilized Outcome: Progressing Goal: Activity at appropriate level-compared to baseline Description: (UP IN CHAIR FOR HEMODIALYSIS) Outcome: Progressing Goal: Discharge plan remains appropriate-arrangements made Outcome: Progressing Goal: Other Phase III Outcomes/Goals Outcome: Progressing   Problem: Discharge Progression Outcomes Goal: Barriers To Progression Addressed/Resolved Outcome: Progressing Goal: Discharge plan in place and appropriate Outcome: Progressing Goal: Pain controlled with appropriate interventions Outcome: Progressing Goal: Hemodynamically stable Outcome: Progressing Goal: Complications resolved/controlled Outcome: Progressing Goal: Activity appropriate for discharge plan Outcome: Progressing Goal: Other Discharge Outcomes/Goals Outcome: Progressing

## 2021-01-03 NOTE — Progress Notes (Signed)
Report given to Nurse [place] Kidney at [facility]. Patient is in stable condition awaiting transport. Denies pain or discomfort.

## 2021-01-03 NOTE — TOC Transition Note (Addendum)
Transition of Care [facility]) - CM/SW Discharge Note   Patient Details  Name: [name] [name] MRN: [id number] Date of Birth: [birth year]  [facility] CM/SW Contact:  Lia Hopping, [place] Phone Number: 01/03/2021, 10:07 AM   Clinical Narrative:    Josephine Igo Approval #[id number] 3 days 1/10-1/12 Detail provided to admission coordinator Olivia Mackie.  [facility] arranged to transport at 2:00pm, Spouse at bedside.  Nurse call report to: [phone], Room 211   Final next level of care: Skilled Nursing Facility Barriers to Discharge: Barriers Resolved   Patient Goals and CMS Choice Patient states their goals for this hospitalization and ongoing recovery are:: go to rehab CMS Medicare.gov Compare Post Acute Care list provided to:: Patient Represenative (must comment) (Spouse) Choice offered to / list presented to : Spouse  Discharge Placement PASRR number recieved: 01/02/21            Patient chooses bed at: [facility], [place] Patient to be transferred to facility by: ptar Name of family member notified: Spouse [name],[name] Patient and family notified of of transfer: 01/03/21  Discharge Plan and Services In-house Referral: Clinical Social Work   Post Acute Care Choice: [facility]                               Social Determinants of Health (SDOH) Interventions     Readmission Risk Interventions No flowsheet data found.

## 2021-01-06 ENCOUNTER — Other Ambulatory Visit: Payer: Self-pay | Admitting: Interventional Radiology

## 2021-01-06 DIAGNOSIS — I82422 Acute embolism and thrombosis of left iliac vein: Secondary | ICD-10-CM

## 2021-01-12 ENCOUNTER — Ambulatory Visit
Admission: RE | Admit: 2021-01-12 | Discharge: 2021-01-12 | Disposition: A | Discharge status: Home / Self Care | Payer: No Typology Code available for payment source | Source: Ambulatory Visit | Attending: Radiology | Admitting: Radiology

## 2021-01-12 ENCOUNTER — Ambulatory Visit
Admission: RE | Admit: 2021-01-12 | Discharge: 2021-01-12 | Disposition: A | Discharge status: Home / Self Care | Payer: No Typology Code available for payment source | Source: Ambulatory Visit | Attending: Interventional Radiology | Admitting: Interventional Radiology

## 2021-01-12 ENCOUNTER — Encounter: Payer: Self-pay | Admitting: *Deleted

## 2021-01-12 DIAGNOSIS — I82422 Acute embolism and thrombosis of left iliac vein: Secondary | ICD-10-CM

## 2021-01-12 HISTORY — PX: IR RADIOLOGIST EVAL & MGMT: IMG5224

## 2021-01-12 NOTE — Consult Note (Signed)
Chief Complaint: Left leg swelling  Referring Physician(s): Allred,Darrell K  History of Present Illness: [name] [name] is a 87 y.o. male presenting as a scheduled consultation to [facility], with history of DVT.    Mr Jake is here today with his wife, who gives me the majority of the history given Mr Lesage's baseline dementia/mental status.   She tells me that the swelling of the left leg and foot started just a few days before his admission to [facility], which was 12/27/2020.  DVT study was performed same day, 1/4.  This diagnosed DVT of the CFV, FV, Popliteal vein, SF jxn, profunda vein, and tibial veins.  While in the hospital MRI ws performed which showed no iliocaval DVT, and persisting DVT of the CFV.    He has been successfully initiated on [facility], taking eliquis at [facility] facility.  She tells me that he has not had any episodes of hemorrhage such as gums, nosebleed, GI bleeding.    There is plan to DC him from [facility] when rehab is done, such that he can return home.  He is dependent on herself and their home aid for most of his ADL's, including bathing, toileting, dressing, meals etc.  Thus, he has elevated frailty score.    Regarding symptoms, he is not indicating that he has any pain at all.  He has not been requiring any medication at all for pain relief.  His sleep has not been altered since this occurred.   Duplex was performed today which demonstrates similar distribution of femoral-popliteal and tibial DVT.      Past Medical History:  Diagnosis Date  . Cancer [facility])    prostate  . Gallstones   . Hypertension   . Stone, kidney   . Stone, kidney     Past Surgical History:  Procedure Laterality Date  . PROSTATE SURGERY      Allergies: Patient has no known allergies.  Medications: Prior to Admission medications   Medication Sig Start Date End Date Taking? Authorizing Provider  amLODipine (NORVASC) 10 MG tablet Take 10 mg by mouth daily.    [provider]   apixaban (ELIQUIS) 5 MG TABS tablet Take 2 tablets (10 mg total) by mouth 2 (two) times daily for 3 days. 01/03/21 01/06/21  Shelly Coss, MD  apixaban (ELIQUIS) 5 MG TABS tablet Take 1 tablet (5 mg total) by mouth 2 (two) times daily. 01/06/21   Shelly Coss, MD  Cholecalciferol (VITAMIN D3) 2000 UNITS TABS Take 2,000 Units by mouth daily.    [provider]  Cyanocobalamin (VITAMIN B 12 PO) Take 500 mcg by mouth daily.     [provider]  diclofenac Sodium (VOLTAREN) 1 % GEL Apply 4 g topically 4 (four) times daily as needed (pain).    [provider]  donepezil (ARICEPT) 10 MG tablet Take 10 mg by mouth every morning.     [provider]  ferrous sulfate 325 (65 FE) MG tablet Take 325 mg by mouth every Monday, Wednesday, and Friday.    [provider]  furosemide (LASIX) 20 MG tablet Take 20 mg by mouth daily as needed for fluid.    [provider]  glipiZIDE (GLUCOTROL) 5 MG tablet Take 5 mg by mouth 2 (two) times daily before a meal.    [provider]  LORazepam (ATIVAN) 0.5 MG tablet Take 1 tablet (0.5 mg total) by mouth every 8 (eight) hours as needed for anxiety. 01/03/21   Shelly Coss, MD  mirabegron ER (MYRBETRIQ) 50 MG TB24 tablet Take 50 mg by mouth daily.    [provider]  Multiple Vitamins-Minerals (MULTIVITAMIN ADULT) CHEW Chew 1 tablet by mouth daily.    [provider]  polyethylene glycol (MIRALAX / GLYCOLAX) 17 g packet Take 17 g by mouth daily as needed. 01/03/21   Shelly Coss, MD  QUEtiapine (SEROQUEL) 25 MG tablet Take 50 mg by mouth 2 (two) times daily.    [provider]  senna-docusate (SENOKOT-S) 8.6-50 MG tablet Take 2 tablets by mouth 2 (two) times daily. 01/03/21   Shelly Coss, MD     Family History  Problem Relation Age of Onset  . Lung cancer Sister     Social History   Socioeconomic History  . Marital status: Married    Spouse name: Not on file  .  Number of children: Not on file  . Years of education: Not on file  . Highest education level: Not on file  Occupational History  . Not on file  Tobacco Use  . Smoking status: Former Smoker    Packs/day: 0.50    Years: 20.00    Pack years: 10.00    Quit date: 12/02/1972    Years since quitting: 48.1  . Smokeless tobacco: Never Used  Substance and Sexual Activity  . Alcohol use: No  . Drug use: No  . Sexual activity: Not on file  Other Topics Concern  . Not on file  Social History Narrative  . Not on file   Social Determinants of Health   Financial Resource Strain: Not on file  Food Insecurity: Not on file  Transportation Needs: Not on file  Physical Activity: Not on file  Stress: Not on file  Social Connections: Not on file      Review of Systems: A 12 point ROS discussed and pertinent positives are indicated in the HPI above.  All other systems are negative.  Review of Systems  Vital Signs: BP (!) 170/92 (BP Location: Right Arm)   SpO2 97%   Physical Exam General: 87 yo male appearing stated age.  Well-developed, well-nourished.  No distress. HEENT: Atraumatic, normocephalic.  Conjugate gaze, extra-ocular motor intact. No scleral icterus or scleral injection. No lesions on external ears, nose, lips, or gums.  Oral mucosa moist, pink.  Neck: Symmetric with no goiter enlargement.  Chest/Lungs:  Symmetric chest with inspiration/expiration.  No labored breathing.  Clear to auscultation with no wheezes, rhonchi, or rales.  Heart:  RRR, with no third heart sounds appreciated. No JVD appreciated.  Abdomen:  Soft, NT/ND, with + bowel sounds.   Genito-urinary: Deferred Neurologic:  He has a flattened affect.  He is responsive, but not altogether appropriate response.   Pulse Exam: positive doppler signal at the Left PT and DP.  Extremities: No wound.  Warm extremity.  Swelling present. There is diameter difference of +2cm greater on the left ankle compared to the right.     Mallampati Score:     Imaging: MR ANGIO PELVIS WO CONTRAST  Result Date: 12/27/2020 CLINICAL DATA:  Deep venous thrombosis EXAM: MRA ABDOMEN AND PELVIS WITHOUT CONTRAST TECHNIQUE: Multiplanar, multiecho pulse sequences of the abdomen and pelvis were obtained without intravenous contrast. Angiographic images of abdomen and pelvis were obtained using MRA technique without intravenous contrast. CONTRAST:  None COMPARISON:  None. FINDINGS: MRA ABDOMEN FINDINGS The examination is limited by motion artifact. Visualized lower extremity arterial inflow and outflow is widely patent. Internal iliac arteries are patent bilaterally. There is T1  peripheral hyperintense, T2 hypointense expansile soft tissue within the left common femoral vein, as well as the proximal and mid femoral vein in keeping with acute DVT. The left external iliac vein and common iliac veins are patent. The inferior vena cava is patent. On the right, the iliofemoral venous system is patent. MRI PELVIS FINDINGS Normal marrow signal activity. Visualized bowel is unremarkable. Prostate gland absent. Bladder is unremarkable. Rectum is unremarkable. No pathologic adenopathy within the pelvis. Numerous simple cortical cysts are seen within the kidneys bilaterally, the largest seen on the right and incompletely visualized but measuring at least 9.9 cm in greatest dimension. Superimposed bilateral renal cortical atrophy, right greater than left. Cholelithiasis noted. Extensive subcutaneous edema within the visualized left lower extremity IMPRESSION: Extensive acute DVT within the left common femoral vein and visualized central femoral vein. No ileo caval DVT. Extensive subcutaneous edema involving the visualized proximal left lower extremity. Cholelithiasis without superimposed inflammatory change. Renal cortical atrophy with superimposed innumerable renal cortical cysts identified. Electronically Signed   By: Fidela Salisbury MD   On: 12/27/2020 22:39    MR ANGIO ABDOMEN WO CONTRAST  Result Date: 12/27/2020 CLINICAL DATA:  Deep venous thrombosis EXAM: MRA ABDOMEN AND PELVIS WITHOUT CONTRAST TECHNIQUE: Multiplanar, multiecho pulse sequences of the abdomen and pelvis were obtained without intravenous contrast. Angiographic images of abdomen and pelvis were obtained using MRA technique without intravenous contrast. CONTRAST:  None COMPARISON:  None. FINDINGS: MRA ABDOMEN FINDINGS The examination is limited by motion artifact. Visualized lower extremity arterial inflow and outflow is widely patent. Internal iliac arteries are patent bilaterally. There is T1 peripheral hyperintense, T2 hypointense expansile soft tissue within the left common femoral vein, as well as the proximal and mid femoral vein in keeping with acute DVT. The left external iliac vein and common iliac veins are patent. The inferior vena cava is patent. On the right, the iliofemoral venous system is patent. MRI PELVIS FINDINGS Normal marrow signal activity. Visualized bowel is unremarkable. Prostate gland absent. Bladder is unremarkable. Rectum is unremarkable. No pathologic adenopathy within the pelvis. Numerous simple cortical cysts are seen within the kidneys bilaterally, the largest seen on the right and incompletely visualized but measuring at least 9.9 cm in greatest dimension. Superimposed bilateral renal cortical atrophy, right greater than left. Cholelithiasis noted. Extensive subcutaneous edema within the visualized left lower extremity IMPRESSION: Extensive acute DVT within the left common femoral vein and visualized central femoral vein. No ileo caval DVT. Extensive subcutaneous edema involving the visualized proximal left lower extremity. Cholelithiasis without superimposed inflammatory change. Renal cortical atrophy with superimposed innumerable renal cortical cysts identified. Electronically Signed   By: Fidela Salisbury MD   On: 12/27/2020 22:39   US RENAL  Result Date:  12/28/2020 CLINICAL DATA:  Elevated creatinine EXAM: RENAL / URINARY TRACT ULTRASOUND COMPLETE COMPARISON:  CT 01/05/2015 FINDINGS: Right Kidney: Renal measurements: 22.3 x 12.4 x 9.7 cm = volume: 1399 mL. Only limited amount of visible cortex. No hydronephrosis. Numerous, greater than 10 cysts throughout the right renal parenchyma. The largest cysts are seen in the upper pole measuring 10.6 x 9.2 x 9 cm and at the lower pole measuring 5.9 x 5.9 x 5.9 cm. Left Kidney: Renal measurements: 11.6 x 6.5 x 7.4 cm = volume: 293 mL. Only limited visible cortex. Cortex is echogenic. No hydronephrosis. Multiple cysts, fewer than 10. The largest are seen in the lower pole and measure 4.6 x 3.6 x 4.5 cm and 3.3 x 3.2 x 3.8 cm. Bladder: Appears normal  for degree of bladder distention. Other: None. IMPRESSION: 1. Negative for hydronephrosis. 2. Multiple cysts within the right greater than left kidney. 3. Echogenic left renal cortex consistent with medical renal disease. Electronically Signed   By: Donavan Foil M.D.   On: 12/28/2020 17:59   VAS [place] LOWER EXTREMITY VENOUS (DVT)  Result Date: 12/27/2020  Lower Venous DVT Study Indications: Edema.  Comparison Study: No previous exam. Performing Technologist: Vonzell Schlatter RVT  Examination Guidelines: A complete evaluation includes B-mode imaging, spectral Doppler, color Doppler, and power Doppler as needed of all accessible portions of each vessel. Bilateral testing is considered an integral part of a complete examination. Limited examinations for reoccurring indications may be performed as noted. The reflux portion of the exam is performed with the patient in reverse Trendelenburg.  +-----+---------------+---------+-----------+----------+--------------+ RIGHTCompressibilityPhasicitySpontaneityPropertiesThrombus Aging +-----+---------------+---------+-----------+----------+--------------+ CFV  Full           Yes      Yes                                  +-----+---------------+---------+-----------+----------+--------------+ SFJ  Full                                                        +-----+---------------+---------+-----------+----------+--------------+   +---------+---------------+---------+-----------+----------+--------------+ LEFT     CompressibilityPhasicitySpontaneityPropertiesThrombus Aging +---------+---------------+---------+-----------+----------+--------------+ CFV      None           No       No                                  +---------+---------------+---------+-----------+----------+--------------+ SFJ      None                                                        +---------+---------------+---------+-----------+----------+--------------+ FV Prox  None                                                        +---------+---------------+---------+-----------+----------+--------------+ FV Mid   None                                                        +---------+---------------+---------+-----------+----------+--------------+ FV DistalNone                                                        +---------+---------------+---------+-----------+----------+--------------+ PFV      None                                                        +---------+---------------+---------+-----------+----------+--------------+  POP      None           No       No                                  +---------+---------------+---------+-----------+----------+--------------+ PTV      None                                                        +---------+---------------+---------+-----------+----------+--------------+ PERO                                                  Not visualized +---------+---------------+---------+-----------+----------+--------------+ Gastroc  None                                                         +---------+---------------+---------+-----------+----------+--------------+   Left Technical Findings: Not visualized segments include Peroneal veins.   Summary: RIGHT: - No evidence of deep vein thrombosis in the lower extremity. No indirect evidence of obstruction proximal to the inguinal ligament.  LEFT: - Findings consistent with acute deep vein thrombosis involving the left common femoral vein, SF junction, left femoral vein, left proximal profunda vein, left popliteal vein, left posterior tibial veins, and left gastrocnemius veins. - Acute DVT in left EIV.  *See table(s) above for measurements and observations. Electronically signed by Curt Jews MD on 12/27/2020 at 1:59:41 PM.    Final     Labs:  CBC: Recent Labs    12/28/20 0446 12/30/20 0359 12/31/20 0335 01/02/21 1733  WBC 9.7 8.4 8.8 12.8*  HGB 10.6* 10.0* 10.6* 11.1*  HCT 35.9* 33.2* 34.6* 37.3*  PLT 214 231 235 269    COAGS: Recent Labs    12/27/20 0318  INR 1.1    BMP: Recent Labs    08/31/20 1932 12/27/20 0318 12/30/20 0359 12/31/20 0335 01/01/21 0334 01/02/21 0345  NA 140   < > 138 136 137 135  K 4.1   < > 4.5 4.5 4.5 4.4  CL 107   < > 105 101 104 101  CO2 26   < > 24 26 24 26   GLUCOSE 103*   < > 126* 137* 132* 173*  BUN 23   < > 28* 24* 31* 30*  CALCIUM 8.6*   < > 8.8* 9.0 9.0 9.0  CREATININE 2.14*   < > 2.23* 2.21* 2.16* 2.07*  GFRNONAA 27*   < > 28* 28* 29* 31*  GFRAA 32*  --   --   --   --   --    < > = values in this interval not displayed.    LIVER FUNCTION TESTS: Recent Labs    08/31/20 1932  BILITOT 0.3  AST 10*  ALT 11  ALKPHOS 84  PROT 6.4*  ALBUMIN 3.2*    TUMOR MARKERS: No results for input(s): AFPTM, CEA, CA199, CHROMGRNA in the last 8760 hours.  Assessment and Plan:  Mr Ransome is  an 87 year old male with DVT of the left leg, involving CFV, PV, FV, SFJxn, and tibial veins, successfully treated with anticoagulation.   I had a lengthy conversation with Mr Petroni and his wife  regarding the natural history of thromboembolic disease and the standard of care therapy AC, which he has started.   Beyond that, intervention would include some variation of thrombectomy/embolectomy, with or without thrombolysis with tPA.  I did share with her the main outcome of the ATTRACT trial which was performed for this purpose, which was no significant difference in symptoms for isolated femoral-popliteal DVT, yet slight increased risk of thrombectomy.  In the absence of iliac DVT, I did let her know that pursing anything further at this time might not have benefit beyond standard of care.  Given his frailty, he also would be at risk for complication.   She agrees with me, and does not particularly wish to pursue anything further.    I did suggest that he start some left leg compression therapy, which is indicated and may help with symptoms.   Plan: - Continue current care, including anticoagulation - Defer any advanced techniques such as mechanical thrombectomy, given above.  She agrees with this plan.  - Recommend initiating compression therapy of the left leg, such as TED hose, to support clearing some edema.    Please CC copy to: Dr. Deland Pretty FAX [phone] Office number: [phone], x [phone]  Electronically Signed: Corrie Mckusick 01/12/2021, 2:57 PM   I spent a total of  60 Minutes   in face to face in clinical consultation, greater than 50% of which was counseling/coordinating care for  Left leg DVT, possible angiogram/intervention.

## 2021-01-24 ENCOUNTER — Other Ambulatory Visit: Payer: No Typology Code available for payment source

## 2021-02-06 IMAGING — US US EXTREM LOW VENOUS*L*
1 series · 13 of 24 positions shown · non-contrast
Comparison: [HOSPITAL] in patient ultrasound 12/27/2020

CLINICAL DATA: 87-year-old male with a history of left-sided DVT



[Series 1: us extrem low venous*left* · 0.08mm/px · 13 of 48 slices shown]
[im 1/48]
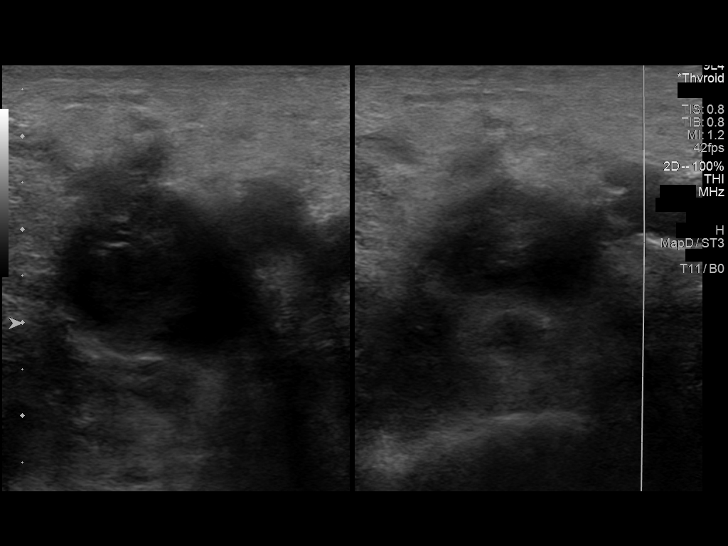
[im 5/48]
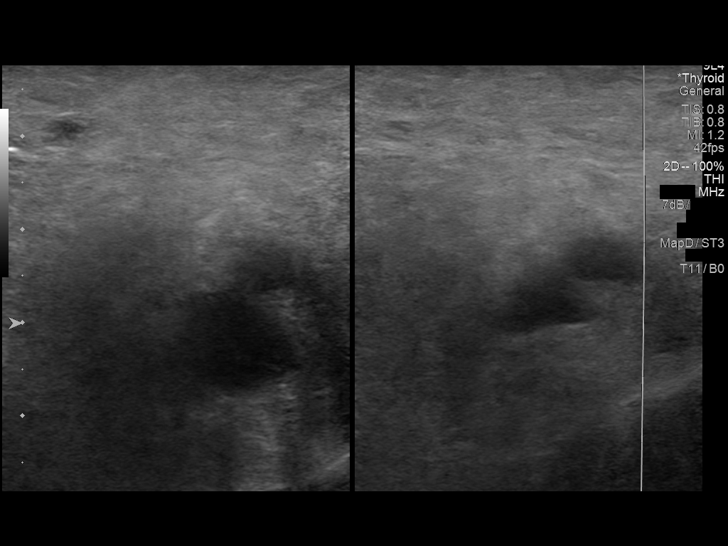
[im 9/48]
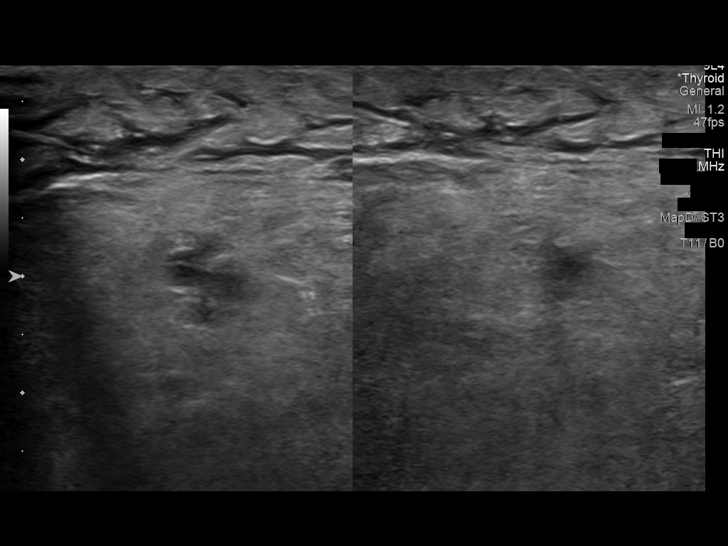
[im 13/48]
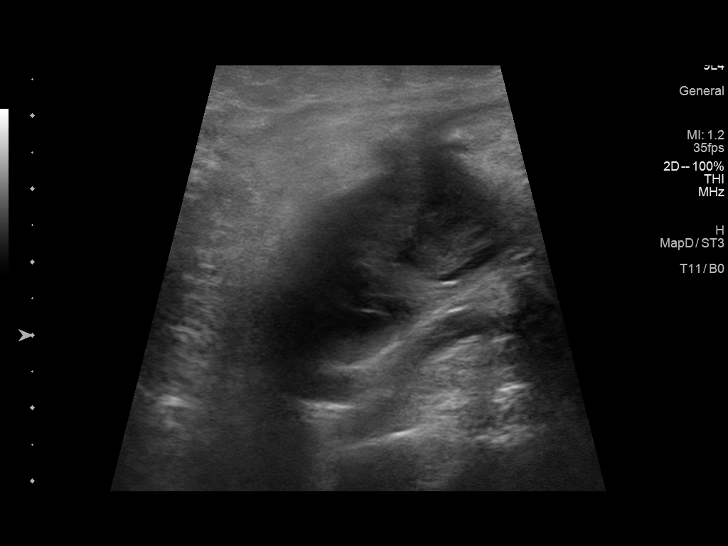
[im 17/48]
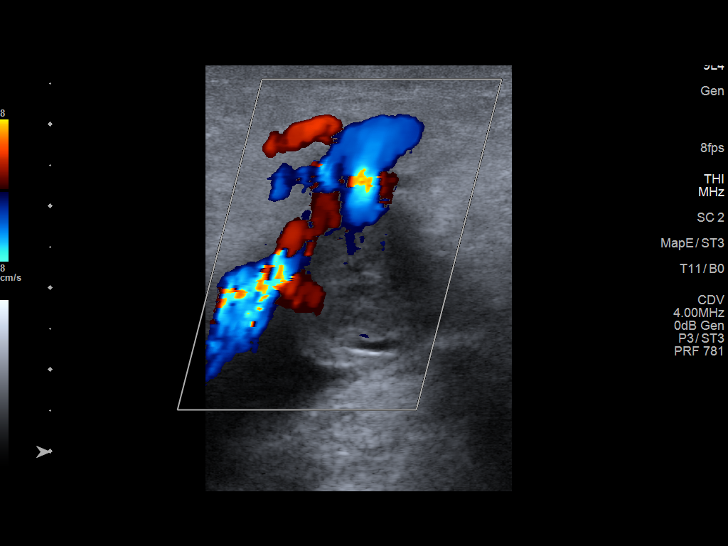
[im 21/48]
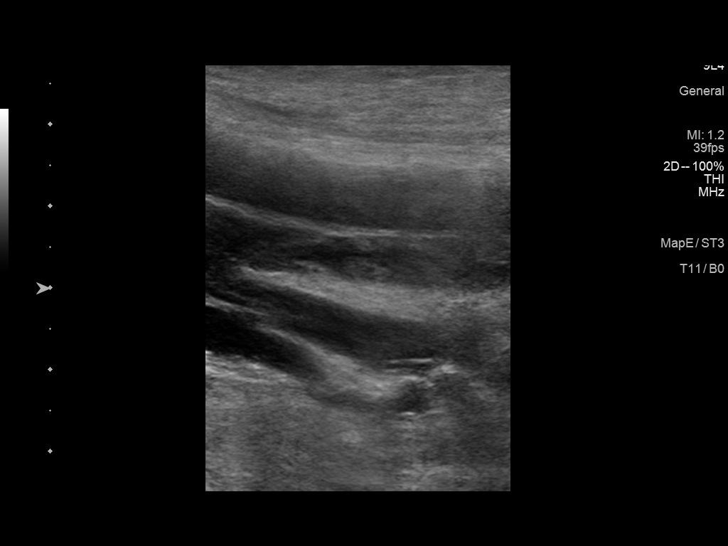
[im 25/48]
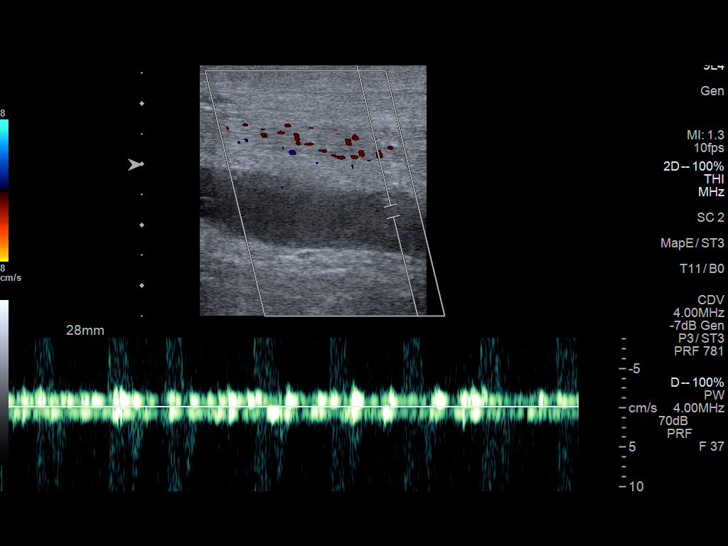
[im 27/48]
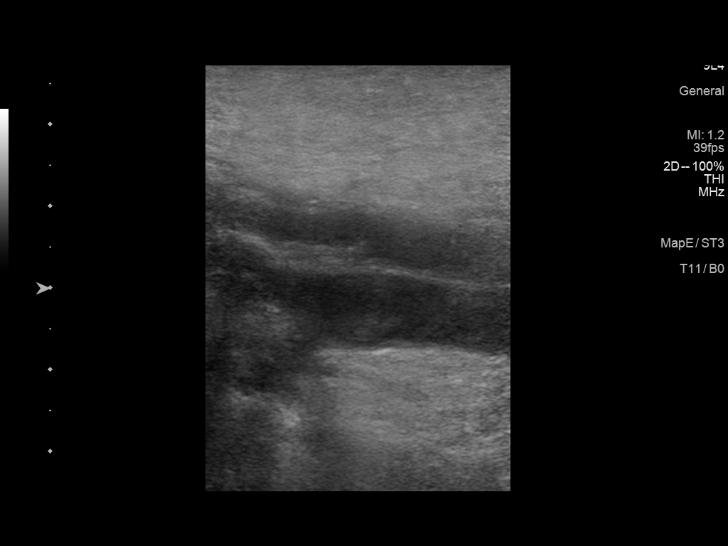
[im 31/48]
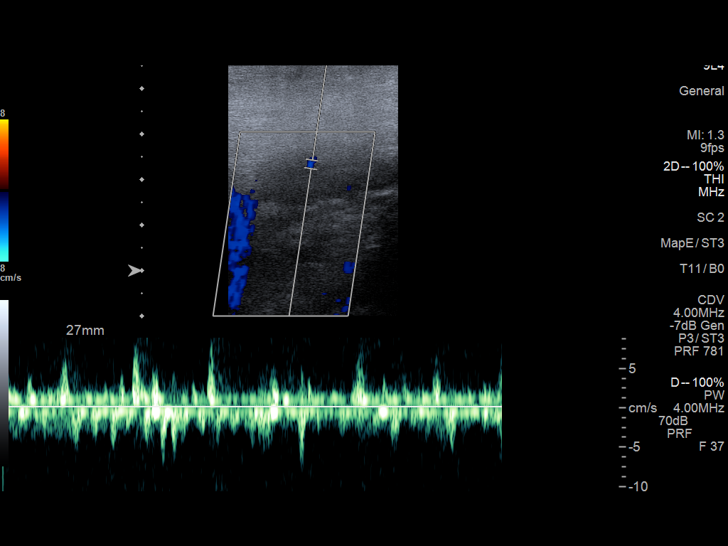
[im 35/48]
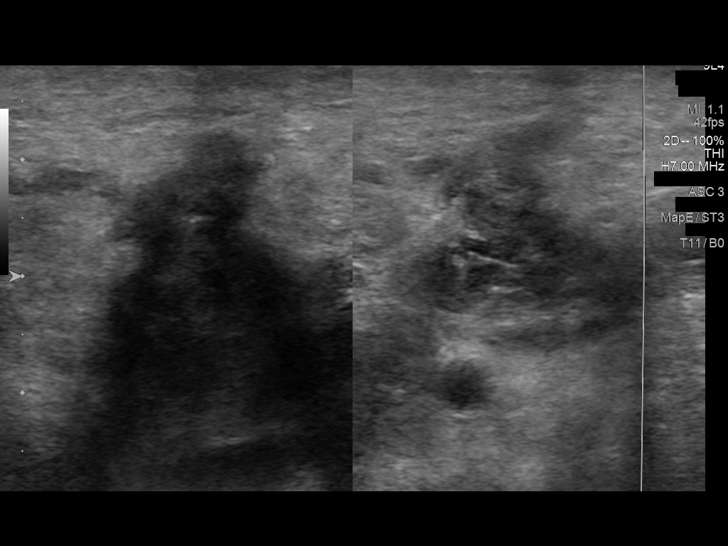
[im 39/48]
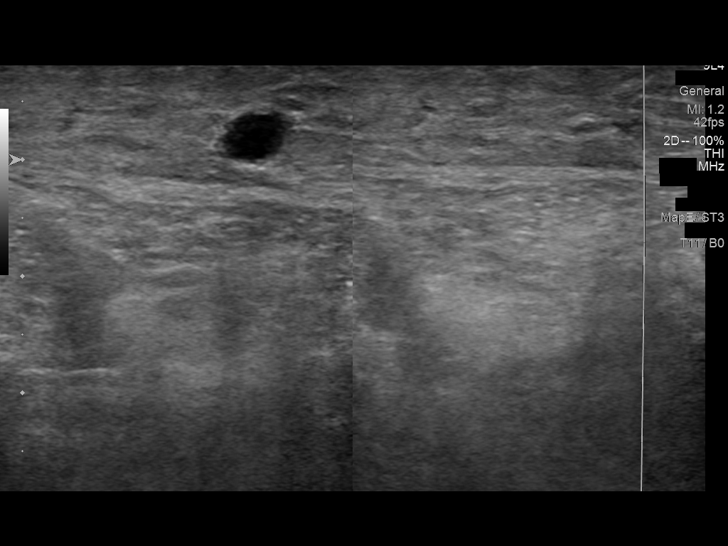
[im 43/48]
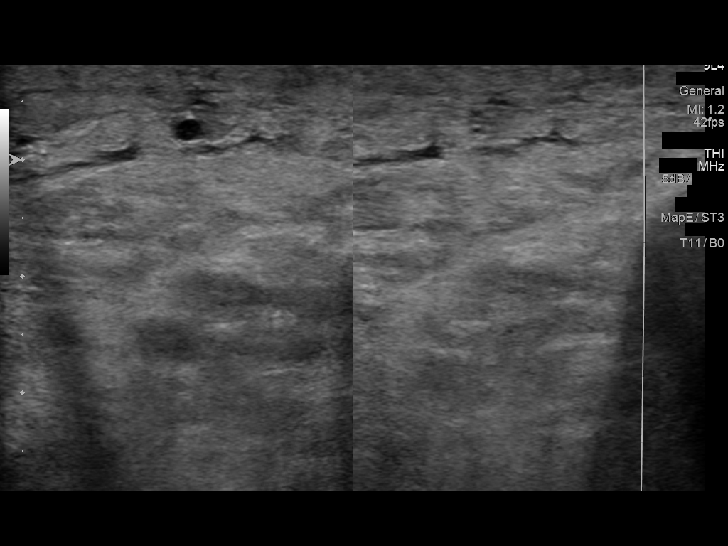
[im 48/48]
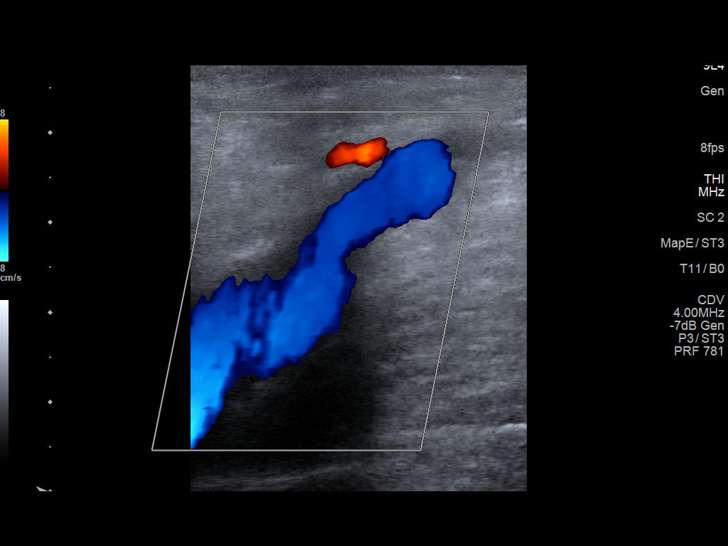

[13 of 24 positions shown; findings below may reference images not displayed]

FINDINGS: Contralateral Common Femoral Vein: Respiratory phasicity is normal
and symmetric with the symptomatic side. No evidence of thrombus.
Normal compressibility.

Noncompressible common femoral vein, saphenofemoral junction,
proximal profunda, femoral vein, and popliteal vein, similar
distribution to the prior. There is flow maintained at the
saphenofemoral junction and flow now at the common femoral vein with
loss of augmentation. Partial flow of femoral vein and popliteal
vein.

Calf Veins: Peroneal vein and posterior tibial vein limited in
evaluation. Posterior tibial vein only partially compressible.

Superficial Great Saphenous Vein: No evidence of thrombus. Normal
compressibility and flow on color Doppler imaging.

Other Findings:  Edema of the lower extremity
IMPRESSION: Redemonstration of left-sided DVT involving common femoral vein,
saphenofemoral junction, profunda vein, femoral vein, popliteal
vein. Developing changes of subacute/chronic nature given the
density and developing internal flow channels.

## 2021-03-29 ENCOUNTER — Other Ambulatory Visit: Payer: Self-pay

## 2021-03-29 ENCOUNTER — Emergency Department (HOSPITAL_COMMUNITY): Payer: No Typology Code available for payment source

## 2021-03-29 ENCOUNTER — Emergency Department (HOSPITAL_COMMUNITY)
Admission: EM | Admit: 2021-03-29 | Discharge: 2021-03-29 | Disposition: A | Discharge status: Home / Self Care | Payer: No Typology Code available for payment source | Attending: Emergency Medicine | Admitting: Emergency Medicine

## 2021-03-29 ENCOUNTER — Encounter (HOSPITAL_COMMUNITY): Payer: Self-pay

## 2021-03-29 DIAGNOSIS — N184 Chronic kidney disease, stage 4 (severe): Secondary | ICD-10-CM | POA: Insufficient documentation

## 2021-03-29 DIAGNOSIS — Z7901 Long term (current) use of anticoagulants: Secondary | ICD-10-CM | POA: Insufficient documentation

## 2021-03-29 DIAGNOSIS — K5900 Constipation, unspecified: Secondary | ICD-10-CM | POA: Insufficient documentation

## 2021-03-29 DIAGNOSIS — F0390 Unspecified dementia without behavioral disturbance: Secondary | ICD-10-CM | POA: Insufficient documentation

## 2021-03-29 DIAGNOSIS — Z79899 Other long term (current) drug therapy: Secondary | ICD-10-CM | POA: Diagnosis not present

## 2021-03-29 DIAGNOSIS — N3001 Acute cystitis with hematuria: Secondary | ICD-10-CM | POA: Insufficient documentation

## 2021-03-29 DIAGNOSIS — I129 Hypertensive chronic kidney disease with stage 1 through stage 4 chronic kidney disease, or unspecified chronic kidney disease: Secondary | ICD-10-CM | POA: Diagnosis not present

## 2021-03-29 DIAGNOSIS — R319 Hematuria, unspecified: Secondary | ICD-10-CM | POA: Diagnosis present

## 2021-03-29 DIAGNOSIS — Z8546 Personal history of malignant neoplasm of prostate: Secondary | ICD-10-CM | POA: Insufficient documentation

## 2021-03-29 DIAGNOSIS — Z87891 Personal history of nicotine dependence: Secondary | ICD-10-CM | POA: Diagnosis not present

## 2021-03-29 LAB — URINALYSIS, ROUTINE W REFLEX MICROSCOPIC
Bilirubin Urine: NEGATIVE
Glucose, UA: NEGATIVE mg/dL
Ketones, ur: NEGATIVE mg/dL
Nitrite: NEGATIVE
Protein, ur: 100 mg/dL — AB
RBC / HPF: >50 RBC/hpf — ABNORMAL HIGH (ref 0–5)
Specific Gravity, Urine: 1.014 (ref 1.005–1.030)
WBC, UA: >50 WBC/hpf — ABNORMAL HIGH (ref 0–5)
pH: 9 — ABNORMAL HIGH (ref 5.0–8.0)

## 2021-03-29 LAB — CBC WITH DIFFERENTIAL/PLATELET
Abs Immature Granulocytes: 0.09 10*3/uL — ABNORMAL HIGH (ref 0.00–0.07)
Basophils Absolute: 0 10*3/uL (ref 0.0–0.1)
Basophils Relative: 0 %
Eosinophils Absolute: 0.1 10*3/uL (ref 0.0–0.5)
Eosinophils Relative: 1 %
HCT: 34.5 % — ABNORMAL LOW (ref 39.0–52.0)
Hemoglobin: 10.3 g/dL — ABNORMAL LOW (ref 13.0–17.0)
Immature Granulocytes: 1 %
Lymphocytes Relative: 17 %
Lymphs Abs: 1.5 10*3/uL (ref 0.7–4.0)
MCH: 22.2 pg — ABNORMAL LOW (ref 26.0–34.0)
MCHC: 29.9 g/dL — ABNORMAL LOW (ref 30.0–36.0)
MCV: 74.4 fL — ABNORMAL LOW (ref 80.0–100.0)
Monocytes Absolute: 0.7 10*3/uL (ref 0.1–1.0)
Monocytes Relative: 8 %
Neutro Abs: 6.2 10*3/uL (ref 1.7–7.7)
Neutrophils Relative %: 73 %
Platelets: 201 10*3/uL (ref 150–400)
RBC: 4.64 MIL/uL (ref 4.22–5.81)
RDW: 13.9 % (ref 11.5–15.5)
WBC: 8.6 10*3/uL (ref 4.0–10.5)
nRBC: 0 % (ref 0.0–0.2)

## 2021-03-29 LAB — COMPREHENSIVE METABOLIC PANEL
ALT: 10 U/L (ref 0–44)
AST: 10 U/L — ABNORMAL LOW (ref 15–41)
Albumin: 3.4 g/dL — ABNORMAL LOW (ref 3.5–5.0)
Alkaline Phosphatase: 93 U/L (ref 38–126)
Anion gap: 7 (ref 5–15)
BUN: 36 mg/dL — ABNORMAL HIGH (ref 8–23)
CO2: 26 mmol/L (ref 22–32)
Calcium: 9.3 mg/dL (ref 8.9–10.3)
Chloride: 110 mmol/L (ref 98–111)
Creatinine, Ser: 2.58 mg/dL — ABNORMAL HIGH (ref 0.61–1.24)
GFR, Estimated: 24 mL/min — ABNORMAL LOW (ref >60)
Glucose, Bld: 201 mg/dL — ABNORMAL HIGH (ref 70–99)
Potassium: 4.9 mmol/L (ref 3.5–5.1)
Sodium: 143 mmol/L (ref 135–145)
Total Bilirubin: 0.3 mg/dL (ref 0.3–1.2)
Total Protein: 7 g/dL (ref 6.5–8.1)

## 2021-03-29 MED ORDER — SODIUM CHLORIDE 0.9 % IV SOLN
1.0000 g | Freq: Once | INTRAVENOUS | Status: AC
  Administered 2021-03-29: 1 g via INTRAVENOUS
  Filled 2021-03-29: qty 10

## 2021-03-29 MED ORDER — POLYETHYLENE GLYCOL 3350 17 G PO PACK: 17.0000 g | PACK | Freq: Every day | ORAL | 0 refills | Status: DC | PRN

## 2021-03-29 MED ORDER — CEPHALEXIN 500 MG PO CAPS: 500.0000 mg | ORAL_CAPSULE | Freq: Three times a day (TID) | ORAL | 0 refills | Status: DC

## 2021-03-29 MED ORDER — BISACODYL 10 MG RE SUPP
10.0000 mg | Freq: Once | RECTAL | Status: AC
  Administered 2021-03-29: 10 mg via RECTAL
  Filled 2021-03-29: qty 1

## 2021-03-29 NOTE — ED Provider Notes (Signed)
[place] DEPT Provider Note   CSN: [id number] Arrival date & time: 03/29/21  1444     History Chief Complaint  Patient presents with  . Hematuria    [name] [name] is a 87 y.o. male.  HPI    87 year old male comes in with chief complaint of bloody urine. Patient has history of DVT and is on Eliquis.  He also has dementia, CKD.  Patient resides with his wife.  She reports that at baseline patient has dementia, however over the last 2 or 3 days he has not had a good BM.  Patient also has been appearing more weaker than usual.  He has had waxing and waning confusion secondary to dementia at baseline.  Review of system is negative for fevers or chills.  Today she noted some blood in the urine, therefore she decided to bring him into the ER.  He had similar symptoms several years ago because of UTI.  No n/v. No abd surgeries.   Past Medical History:  Diagnosis Date  . Cancer [facility])    prostate  . Gallstones   . Hypertension   . Stone, kidney   . Stone, kidney     Patient Active Problem List   Diagnosis Date Noted  . Palliative care by specialist   . DVT (deep venous thrombosis) ([place]) 12/27/2020  . Acute kidney injury superimposed on chronic kidney disease ([place]) 03/26/2016  . Syncope and collapse 11/06/2015  . Hypertension 11/06/2015  . Carotid sinus syncope or syndrome 11/06/2015  . Acute renal failure syndrome ([place])   . CKD (chronic kidney disease) stage 4, GFR 15-29 ml/min (HCC) 03/27/2015  . Anemia of chronic disease 03/27/2015  . Syncope 12/02/2011  . HTN (hypertension) 12/02/2011    Past Surgical History:  Procedure Laterality Date  . IR RADIOLOGIST EVAL & MGMT  01/12/2021  . PROSTATE SURGERY         Family History  Problem Relation Age of Onset  . Lung cancer Sister     Social History   Tobacco Use  . Smoking status: Former Smoker    Packs/day: 0.50    Years: 20.00    Pack years: 10.00    Quit date: 12/02/1972    Years  since quitting: 48.3  . Smokeless tobacco: Never Used  Substance Use Topics  . Alcohol use: No  . Drug use: No    Home Medications Prior to Admission medications   Medication Sig Start Date End Date Taking? Authorizing Provider  amLODipine (NORVASC) 10 MG tablet Take 10 mg by mouth daily.   Yes [provider]  apixaban (ELIQUIS) 5 MG TABS tablet Take 1 tablet (5 mg total) by mouth 2 (two) times daily. 01/06/21  Yes Shelly Coss, MD  cephALEXin (KEFLEX) 500 MG capsule Take 1 capsule (500 mg total) by mouth 3 (three) times daily. 03/29/21  Yes Varney Biles, MD  diclofenac Sodium (VOLTAREN) 1 % GEL Apply 4 g topically 4 (four) times daily as needed (pain).   Yes [provider]  feeding supplement, GLUCERNA SHAKE, (GLUCERNA SHAKE) LIQD Take 237 mLs by mouth daily.   Yes [provider]  ipratropium (ATROVENT) 0.03 % nasal spray Place 2 sprays into both nostrils every 6 (six) hours.   Yes [provider]  LORazepam (ATIVAN) 0.5 MG tablet Take 1 tablet (0.5 mg total) by mouth every 8 (eight) hours as needed for anxiety. 01/03/21  Yes Adhikari, Tamsen Meek, MD  memantine (NAMENDA) 10 MG tablet Take 10 mg by  mouth 2 (two) times daily.   Yes [provider]  mirabegron ER (MYRBETRIQ) 50 MG TB24 tablet Take 50 mg by mouth daily.   Yes [provider]  QUEtiapine (SEROQUEL) 100 MG tablet Take 50 mg by mouth in the morning, at noon, in the evening, and at bedtime.   Yes [provider]  apixaban (ELIQUIS) 5 MG TABS tablet Take 2 tablets (10 mg total) by mouth 2 (two) times daily for 3 days. 01/03/21 01/06/21  Shelly Coss, MD  polyethylene glycol (MIRALAX / GLYCOLAX) 17 g packet Take 17 g by mouth daily as needed. 03/29/21   Varney Biles, MD    Allergies    Patient has no known allergies.  Review of Systems   Review of Systems  Unable to perform ROS: Dementia    Physical Exam Updated Vital Signs BP 139/69   Pulse 72   Temp 98.7 F  (37.1 C) (Oral)   Resp 15   Ht 6' (1.829 m)   Wt 102.1 kg   SpO2 98%   BMI 30.53 kg/m   Physical Exam Vitals and nursing note reviewed.  Constitutional:      Appearance: He is well-developed.  HENT:     Head: Atraumatic.  Cardiovascular:     Rate and Rhythm: Normal rate.  Pulmonary:     Effort: Pulmonary effort is normal.  Musculoskeletal:     Cervical back: Neck supple.  Skin:    General: Skin is warm.  Neurological:     Mental Status: He is alert and oriented to person, place, and time.     ED Results / Procedures / Treatments   Labs (all labs ordered are listed, but only abnormal results are displayed) Labs Reviewed  COMPREHENSIVE METABOLIC PANEL - Abnormal; Notable for the following components:      Result Value   Glucose, Bld 201 (*)    BUN 36 (*)    Creatinine, Ser 2.58 (*)    Albumin 3.4 (*)    AST 10 (*)    GFR, Estimated 24 (*)    All other components within normal limits  CBC WITH DIFFERENTIAL/PLATELET - Abnormal; Notable for the following components:   Hemoglobin 10.3 (*)    HCT 34.5 (*)    MCV 74.4 (*)    MCH 22.2 (*)    MCHC 29.9 (*)    Abs Immature Granulocytes 0.09 (*)    All other components within normal limits  URINALYSIS, ROUTINE W REFLEX MICROSCOPIC - Abnormal; Notable for the following components:   Color, Urine AMBER (*)    APPearance TURBID (*)    pH 9.0 (*)    Hgb urine dipstick MODERATE (*)    Protein, ur 100 (*)    Leukocytes,Ua LARGE (*)    RBC / HPF >50 (*)    WBC, UA >50 (*)    Bacteria, UA MANY (*)    All other components within normal limits  URINE CULTURE    EKG None  Radiology DG ABD ACUTE 2+V W 1V CHEST  Result Date: 03/29/2021 CLINICAL DATA:  Constipation for 5 days, blood in urine starting today, former smoker, history Alzheimer's EXAM: DG ABDOMEN ACUTE WITH 1 VIEW CHEST COMPARISON:  Chest radiograph 01/17/2018 FINDINGS: Normal heart size and pulmonary vascularity. Atherosclerotic calcification and tortuosity of  thoracic aorta. Decreased lung volumes with bibasilar atelectasis. No acute infiltrate, pleural effusion or pneumothorax. Increased stool throughout colon and rectum consistent with history of constipation. Few wears filled small bowel loops noted. No evidence  of bowel obstruction, bowel wall thickening or free air. Numerous surgical clips in pelvis by history prior prostate surgery. IMPRESSION: Increased stool throughout colon and rectum consistent with constipation. Bibasilar atelectasis. Aortic Atherosclerosis (ICD10-I70.0). Electronically Signed   By: Lavonia Dana M.D.   On: 03/29/2021 19:00    Procedures Procedures   Medications Ordered in ED Medications  bisacodyl (DULCOLAX) suppository 10 mg (has no administration in time range)  cefTRIAXone (ROCEPHIN) 1 g in sodium chloride 0.9 % 100 mL IVPB (0 g Intravenous Stopped 03/29/21 1836)    ED Course  I have reviewed the triage vital signs and the nursing notes.  Pertinent labs & imaging results that were available during my care of the patient were reviewed by me and considered in my medical decision making (see chart for details).    MDM Rules/Calculators/A&P                          Patient comes in a chief complaint of constipation, weakness. He is here with his wife.  He has history of dementia.  Patient's wife reports that patient has not had a BM in 2 days, and she has been informed by the [place] doctor to give the patient suppository.  Today she noted hematuria, brought him into the ER.  Patient has no abdominal surgical history.  Abdomen is soft, no tenderness appreciated on examination.  There is some bruising on the left lower thoracic region, but there is no evidence of any ecchymosis in the abdominal region.  We will get acute abdominal series to ensure there is no subtle signs of SBO.  Pretest probability for SBO is low given that there is no abdominal surgical history and there is no nausea, vomiting or abdominal tenderness or  rigidity at this time.  With the hematuria, concerns are for possible cystitis as the underlying cause.  Patient had MRI done recently during his admission, it did not show any evidence of tumor in the kidney, which is reassuring.  Reassessment: Patient's urine is showing signs of infection.  I ordered ceftriaxone.  Results of been discussed with the patient's wife, I informed her that I had forgotten to order acute abdominal series -and that we can discharge him once it is looking fine.  Reassessment: Acute abdominal series results discussed with the patient and his wife.  IV antibiotics have been completed.  Wife is upset that she had to wait this long for the disposition to occur.  I apologizes for the delay and informed her that my mistake of forgetting to order AAS at the onset was the issue.  Patient did receive suppository prior to being discharged.  Final Clinical Impression(s) / ED Diagnoses Final diagnoses:  Constipation, unspecified constipation type  Acute cystitis with hematuria    Rx / DC Orders ED Discharge Orders         Ordered    cephALEXin (KEFLEX) 500 MG capsule  3 times daily        03/29/21 1952    polyethylene glycol (MIRALAX / GLYCOLAX) 17 g packet  Daily PRN        03/29/21 1957           Varney Biles, MD 03/29/21 2026

## 2021-03-29 NOTE — Discharge Instructions (Addendum)
Your urine is showing evidence of infection.  Please take the antibiotics prescribed.  We also noticed that he has severe constipation.  Suppository was given in the ER.  Continue taking the suppository that has been prescribed by your doctor.  Additionally, consider taking MiraLAX 24 hours after the suppository if he still have not had a BM.  Return to the ER if there is nausea, vomiting, fevers, confusion.

## 2021-03-29 NOTE — ED Notes (Signed)
Pt spouse verbalized understanding of d/c, medication, and follow up care.

## 2021-03-29 NOTE — ED Triage Notes (Signed)
Pt arrived via walk in, with wife. Pt with hx of alzheimer's. Per pt wife, pt has been constipation x5 days and blood in urine starting today.

## 2021-03-29 NOTE — ED Notes (Signed)
Pt given graham crackers and apple juice  

## 2021-03-31 LAB — URINE CULTURE: Culture: >=100000 — AB

## 2021-04-01 ENCOUNTER — Telehealth: Payer: Self-pay | Admitting: Emergency Medicine

## 2021-04-01 NOTE — Telephone Encounter (Signed)
Post ED Visit - Positive Culture Follow-up  Culture report reviewed by antimicrobial stewardship pharmacist: [place] Team []  [name], Pharm.D. []  [name], Pharm.D., BCPS AQ-ID []  [name], Pharm.D., BCPS []  [name], Pharm.D., BCPS []  [name], Pharm.D., BCPS, AAHIVP []  [name], Pharm.D., BCPS, AAHIVP []  [name], PharmD, BCPS []  [name], PharmD, BCPS []  [name], PharmD, BCPS []  [name], PharmD []  [name], PharmD, BCPS []  [name], PharmD  [name] []  [name], PharmD []  [name], PharmD []  [name], PharmD []  [name], Rph []  [name], PharmD []  [name], PharmD []  [name], PharmD []  [name], PharmD []  [name], PharmD []  [name], PharmD [x]  [name], PharmD []  [name], PharmD []  [name], PharmD   Positive urine culture Treated with Cephalexin, organism sensitive to the same and no further patient follow-up is required at this time.  [name] [name] [name] [name] 04/01/2021, 3:59 PM

## 2021-04-14 ENCOUNTER — Other Ambulatory Visit: Payer: Self-pay

## 2021-04-14 ENCOUNTER — Encounter (HOSPITAL_COMMUNITY): Payer: Self-pay | Admitting: Emergency Medicine

## 2021-04-14 ENCOUNTER — Emergency Department (HOSPITAL_COMMUNITY)
Admission: EM | Admit: 2021-04-14 | Discharge: 2021-04-15 | Disposition: A | Discharge status: Home / Self Care | Payer: No Typology Code available for payment source | Attending: Emergency Medicine | Admitting: Emergency Medicine

## 2021-04-14 ENCOUNTER — Emergency Department (HOSPITAL_COMMUNITY): Payer: No Typology Code available for payment source

## 2021-04-14 DIAGNOSIS — Z8546 Personal history of malignant neoplasm of prostate: Secondary | ICD-10-CM | POA: Insufficient documentation

## 2021-04-14 DIAGNOSIS — Z87891 Personal history of nicotine dependence: Secondary | ICD-10-CM | POA: Insufficient documentation

## 2021-04-14 DIAGNOSIS — N184 Chronic kidney disease, stage 4 (severe): Secondary | ICD-10-CM | POA: Insufficient documentation

## 2021-04-14 DIAGNOSIS — K5900 Constipation, unspecified: Secondary | ICD-10-CM

## 2021-04-14 DIAGNOSIS — Z87442 Personal history of urinary calculi: Secondary | ICD-10-CM | POA: Diagnosis not present

## 2021-04-14 DIAGNOSIS — F0390 Unspecified dementia without behavioral disturbance: Secondary | ICD-10-CM | POA: Diagnosis not present

## 2021-04-14 DIAGNOSIS — I129 Hypertensive chronic kidney disease with stage 1 through stage 4 chronic kidney disease, or unspecified chronic kidney disease: Secondary | ICD-10-CM | POA: Insufficient documentation

## 2021-04-14 DIAGNOSIS — Z79899 Other long term (current) drug therapy: Secondary | ICD-10-CM | POA: Insufficient documentation

## 2021-04-14 DIAGNOSIS — K5901 Slow transit constipation: Secondary | ICD-10-CM

## 2021-04-14 DIAGNOSIS — R140 Abdominal distension (gaseous): Secondary | ICD-10-CM | POA: Diagnosis present

## 2021-04-14 NOTE — ED Triage Notes (Signed)
Patient BIB spouse with complaints of constipation x5 days. Hx dementia. Difficulty following commands.

## 2021-04-15 ENCOUNTER — Emergency Department (HOSPITAL_COMMUNITY): Payer: No Typology Code available for payment source

## 2021-04-15 LAB — CBC WITH DIFFERENTIAL/PLATELET
Abs Immature Granulocytes: 0.12 10*3/uL — ABNORMAL HIGH (ref 0.00–0.07)
Basophils Absolute: 0.1 10*3/uL (ref 0.0–0.1)
Basophils Relative: 1 %
Eosinophils Absolute: 0.2 10*3/uL (ref 0.0–0.5)
Eosinophils Relative: 2 %
HCT: 33.6 % — ABNORMAL LOW (ref 39.0–52.0)
Hemoglobin: 10 g/dL — ABNORMAL LOW (ref 13.0–17.0)
Immature Granulocytes: 1 %
Lymphocytes Relative: 13 %
Lymphs Abs: 1.3 10*3/uL (ref 0.7–4.0)
MCH: 22 pg — ABNORMAL LOW (ref 26.0–34.0)
MCHC: 29.8 g/dL — ABNORMAL LOW (ref 30.0–36.0)
MCV: 73.8 fL — ABNORMAL LOW (ref 80.0–100.0)
Monocytes Absolute: 0.9 10*3/uL (ref 0.1–1.0)
Monocytes Relative: 9 %
Neutro Abs: 7.4 10*3/uL (ref 1.7–7.7)
Neutrophils Relative %: 74 %
Platelets: 234 10*3/uL (ref 150–400)
RBC: 4.55 MIL/uL (ref 4.22–5.81)
RDW: 13.7 % (ref 11.5–15.5)
WBC: 9.9 10*3/uL (ref 4.0–10.5)
nRBC: 0 % (ref 0.0–0.2)

## 2021-04-15 LAB — BASIC METABOLIC PANEL
Anion gap: 7 (ref 5–15)
BUN: 31 mg/dL — ABNORMAL HIGH (ref 8–23)
CO2: 26 mmol/L (ref 22–32)
Calcium: 8.9 mg/dL (ref 8.9–10.3)
Chloride: 104 mmol/L (ref 98–111)
Creatinine, Ser: 2.14 mg/dL — ABNORMAL HIGH (ref 0.61–1.24)
GFR, Estimated: 29 mL/min — ABNORMAL LOW (ref >60)
Glucose, Bld: 154 mg/dL — ABNORMAL HIGH (ref 70–99)
Potassium: 4.6 mmol/L (ref 3.5–5.1)
Sodium: 137 mmol/L (ref 135–145)

## 2021-04-15 MED ORDER — POLYETHYLENE GLYCOL 3350 17 GM/SCOOP PO POWD: ORAL | 0 refills | Status: AC

## 2021-04-15 NOTE — Discharge Instructions (Addendum)
Please take 6 capfuls of MiraLAX in a 32 oz bottle of Gatorade over 2-4 hour period. The following day take 3 capfuls. On day 3 start taking 1 capful 3 times a day. Slowly cut back as needed until you have normal bowel movements.

## 2021-04-15 NOTE — ED Provider Notes (Signed)
[place] DEPT Provider Note  CSN: [id number] Arrival date & time: 04/14/21 2237  Chief Complaint(s) Constipation  HPI [name] [name] is a 87 y.o. male with a past medical history listed below including dementia accompanied by his wife.  She reports that the patient has not had a bowel movement in 5 to 6 days.  She does not know whether he has passed gas.  She noted that his abdomen is slightly distended.  Usually he does not complain of any discomfort and does not want him to come the hospital but today he requested to come.  She denies any vomiting.  No known fevers.  No cough.  Remainder of history, ROS, and physical exam limited due to patient's condition (dementia). Additional information was obtained from wife.   Level V Caveat.    HPI  Past Medical History Past Medical History:  Diagnosis Date  . Cancer [facility])    prostate  . Gallstones   . Hypertension   . Stone, kidney   . Stone, kidney    Patient Active Problem List   Diagnosis Date Noted  . Palliative care by specialist   . DVT (deep venous thrombosis) ([place]) 12/27/2020  . Acute kidney injury superimposed on chronic kidney disease ([place]) 03/26/2016  . Syncope and collapse 11/06/2015  . Hypertension 11/06/2015  . Carotid sinus syncope or syndrome 11/06/2015  . Acute renal failure syndrome ([place])   . CKD (chronic kidney disease) stage 4, GFR 15-29 ml/min (HCC) 03/27/2015  . Anemia of chronic disease 03/27/2015  . Syncope 12/02/2011  . HTN (hypertension) 12/02/2011   Home Medication(s) Prior to Admission medications   Medication Sig Start Date End Date Taking? Authorizing Provider  polyethylene glycol powder (MIRALAX) 17 GM/SCOOP powder Please take 6 capfuls of MiraLAX in a 32 oz bottle of Gatorade over 2-4 hour period. The following day take 3 capfuls. On day 3 start taking 1 capful 3 times a day. Slowly cut back as needed until you have normal bowel movements. 04/15/21  Yes [place] Kretz,  Grayce Sessions, MD  amLODipine (NORVASC) 10 MG tablet Take 10 mg by mouth daily.    [provider]  apixaban (ELIQUIS) 5 MG TABS tablet Take 2 tablets (10 mg total) by mouth 2 (two) times daily for 3 days. 01/03/21 01/06/21  Shelly Coss, MD  apixaban (ELIQUIS) 5 MG TABS tablet Take 1 tablet (5 mg total) by mouth 2 (two) times daily. 01/06/21   Shelly Coss, MD  cephALEXin (KEFLEX) 500 MG capsule Take 1 capsule (500 mg total) by mouth 3 (three) times daily. 03/29/21   Varney Biles, MD  diclofenac Sodium (VOLTAREN) 1 % GEL Apply 4 g topically 4 (four) times daily as needed (pain).    [provider]  feeding supplement, GLUCERNA SHAKE, (GLUCERNA SHAKE) LIQD Take 237 mLs by mouth daily.    [provider]  ipratropium (ATROVENT) 0.03 % nasal spray Place 2 sprays into both nostrils every 6 (six) hours.    [provider]  LORazepam (ATIVAN) 0.5 MG tablet Take 1 tablet (0.5 mg total) by mouth every 8 (eight) hours as needed for anxiety. 01/03/21   Shelly Coss, MD  memantine (NAMENDA) 10 MG tablet Take 10 mg by mouth 2 (two) times daily.    [provider]  mirabegron ER (MYRBETRIQ) 50 MG TB24 tablet Take 50 mg by mouth daily.    [provider]  QUEtiapine (SEROQUEL) 100 MG tablet Take 50 mg by mouth in the morning, at noon, in  the evening, and at bedtime.    [provider]                                                                                                                                    Past Surgical History Past Surgical History:  Procedure Laterality Date  . IR RADIOLOGIST EVAL & MGMT  01/12/2021  . PROSTATE SURGERY     Family History Family History  Problem Relation Age of Onset  . Lung cancer Sister     Social History Social History   Tobacco Use  . Smoking status: Former Smoker    Packs/day: 0.50    Years: 20.00    Pack years: 10.00    Quit date: 12/02/1972    Years since quitting: 48.4  .  Smokeless tobacco: Never Used  Substance Use Topics  . Alcohol use: No  . Drug use: No   Allergies Patient has no known allergies.  Review of Systems Review of Systems  Unable to perform ROS: Dementia    Physical Exam Vital Signs  I have reviewed the triage vital signs BP (!) 145/81   Pulse 66   Resp 15   SpO2 98%   Physical Exam Vitals reviewed.  Constitutional:      General: He is not in acute distress.    Appearance: He is well-developed. He is not diaphoretic.  HENT:     Head: Normocephalic and atraumatic.     Jaw: No trismus.     Right Ear: External ear normal.     Left Ear: External ear normal.     Nose: Nose normal.  Eyes:     General: No scleral icterus.    Conjunctiva/sclera: Conjunctivae normal.  Neck:     Trachea: Phonation normal.  Cardiovascular:     Rate and Rhythm: Normal rate and regular rhythm.  Pulmonary:     Effort: Pulmonary effort is normal. No respiratory distress.     Breath sounds: No stridor.  Abdominal:     General: There is distension.     Tenderness: There is no abdominal tenderness. There is no guarding or rebound.  Genitourinary:    Comments: DRE with dark brown stool in the rectum. No impaction. Musculoskeletal:        General: Normal range of motion.     Cervical back: Normal range of motion.  Neurological:     Mental Status: He is alert and oriented to person, place, and time.  Psychiatric:        Behavior: Behavior normal.     ED Results and Treatments Labs (all labs ordered are listed, but only abnormal results are displayed) Labs Reviewed  CBC WITH DIFFERENTIAL/PLATELET - Abnormal; Notable for the following components:      Result Value   Hemoglobin 10.0 (*)    HCT 33.6 (*)    MCV 73.8 (*)    MCH 22.0 (*)    MCHC 29.8 (*)  Abs Immature Granulocytes 0.12 (*)    All other components within normal limits  BASIC METABOLIC PANEL - Abnormal; Notable for the following components:   Glucose, Bld 154 (*)    BUN 31  (*)    Creatinine, Ser 2.14 (*)    GFR, Estimated 29 (*)    All other components within normal limits                                                                                                                         EKG  EKG Interpretation  Date/Time:    Ventricular Rate:    PR Interval:    QRS Duration:   QT Interval:    QTC Calculation:   R Axis:     Text Interpretation:        Radiology CT ABDOMEN PELVIS WO CONTRAST  Result Date: 04/15/2021 CLINICAL DATA:  Constipation for 5 days. Suspected bowel obstruction. EXAM: CT ABDOMEN AND PELVIS WITHOUT CONTRAST TECHNIQUE: Multidetector CT imaging of the abdomen and pelvis was performed following the standard protocol without IV contrast. COMPARISON:  01/05/2015 FINDINGS: Lower chest: Linear atelectasis in the lung bases. Coronary artery calcifications. Hepatobiliary: No focal liver lesions. Cholelithiasis with a single stone in the gallbladder. No inflammatory changes. No bile duct dilatation. Pancreas: Unremarkable. No pancreatic ductal dilatation or surrounding inflammatory changes. Spleen: Normal in size without focal abnormality. Adrenals/Urinary Tract: Left adrenal gland nodule measuring 3.4 cm diameter. Fat density Hounsfield unit measurements consistent with a benign adenoma. No change since prior study. Multiple bilateral renal cysts. Largest in the right upper pole measures 10.3 cm diameter, unchanged since prior study. In the medial mid left kidney, there is a 2.3 cm diameter mixed density lesion but this appears unchanged in size since prior study, likely representing hemorrhagic cyst. No hydronephrosis or stones identified. Bladder is normal. Stomach/Bowel: Diffusely stool-filled colon likely due to constipation. Mid abdominal small bowel are filled with gas and fluid with small air-fluid levels. Maximal small bowel diameter is about 3 cm, upper limits of normal. No specific transition zone. Changes likely to represent ileus or  enteritis. No wall thickening is appreciated. Appendix is normal. Vascular/Lymphatic: Aortic atherosclerosis. No enlarged abdominal or pelvic lymph nodes. Reproductive: Surgical absence of the prostate gland with multiple surgical clips in the pelvis. No significant lymphadenopathy. Other: No free air or free fluid in the abdomen. Abdominal wall musculature appears intact. Musculoskeletal: Asymmetrical appearance of the left pelvis with mild expansile change and heterogeneous marrow density. Possibly infiltrative bone lesion or fibrous dysplasia. Bone scan correlation suggested. IMPRESSION: 1. Diffusely stool-filled colon likely due to constipation. 2. Mid abdominal small bowel are filled with gas and fluid with small air-fluid levels. Maximal small bowel diameter is about 3 cm, upper limits of normal. Changes likely to represent ileus or enteritis. No specific transition zone is appreciated. 3. Multiple bilateral renal cysts. 4. Left adrenal gland adenoma. 5. Cholelithiasis without evidence of cholecystitis. 6. Asymmetric appearance of the left pelvis. Suggest bone  scan correlation. Aortic Atherosclerosis (ICD10-I70.0). Electronically Signed   By: Lucienne Capers M.D.   On: 04/15/2021 01:05   DG ABD ACUTE 2+V W 1V CHEST  Result Date: 04/15/2021 CLINICAL DATA:  Constipation for 5 days. EXAM: DG ABDOMEN ACUTE WITH 1 VIEW CHEST COMPARISON:  03/29/2021 FINDINGS: Shallow inspiration with linear atelectasis in the lung bases. Heart size and pulmonary vascularity are normal. No pleural effusions. No pneumothorax. Mediastinal contours appear intact. Gas and stool are demonstrated throughout the colon. Mildly distended gas-filled mid abdominal small bowel. No free intra-abdominal air. No abnormal air-fluid levels. No radiopaque stones. Surgical clips in the pelvis. Degenerative changes in the spine and hips. IMPRESSION: 1. Shallow inspiration with atelectasis in the lung bases. 2. Mildly distended gas-filled mid  abdominal small bowel suggesting early obstruction. Electronically Signed   By: Lucienne Capers M.D.   On: 04/15/2021 00:06    Pertinent labs & imaging results that were available during my care of the patient were reviewed by me and considered in my medical decision making (see chart for details).  Medications Ordered in ED Medications - No data to display                                                                                                                                  Procedures Procedures  (including critical care time)  Medical Decision Making / ED Course I have reviewed the nursing notes for this encounter and the patient's prior records (if available in EHR or on provided paperwork).   Shawna Kiener was evaluated in Emergency Department on 04/15/2021 for the symptoms described in the history of present illness. He was evaluated in the context of the global COVID-19 pandemic, which necessitated consideration that the patient might be at risk for infection with the SARS-CoV-2 virus that causes COVID-19. Institutional protocols and algorithms that pertain to the evaluation of patients at risk for COVID-19 are in a state of rapid change based on information released by regulatory bodies including the CDC and federal and state organizations. These policies and algorithms were followed during the patient's care in the ED.  Likely constipation, but will get acute abd series to assess for possible SBO. Abnormal gas pattern on plain film. Will get CT to better assess. Screening labs ordered.  Labs reassuring with stable renal function and anemia. CT confirmed constipation and NO SBO. Since patient is tolerating PO, plan to DC home with MiraLax regimen. Wife preferred this to admission for golytely.         Final Clinical Impression(s) / ED Diagnoses Final diagnoses:  Constipation  Slow transit constipation   The patient appears reasonably screened and/or stabilized for  discharge and I doubt any other medical condition or other [facility] requiring further screening, evaluation, or treatment in the ED at this time prior to discharge. Safe for discharge with strict return precautions.  Disposition: Discharge  Condition: Good  I have discussed the results,  Dx and Tx plan with the patient/family who expressed understanding and agree(s) with the plan. Discharge instructions discussed at length. The patient/family was given strict return precautions who verbalized understanding of the instructions. No further questions at time of discharge.    ED Discharge Orders         Ordered    polyethylene glycol powder (MIRALAX) 17 GM/SCOOP powder        04/15/21 0143           Follow Up: Center, [place] [address] [place] [address] [phone]  Call  as needed      This chart was dictated using voice recognition software.  Despite best efforts to proofread,  errors can occur which can change the documentation meaning.   Fatima Blank, MD 04/15/21 [phone]

## 2021-04-17 ENCOUNTER — Encounter (HOSPITAL_COMMUNITY): Payer: Self-pay | Admitting: Internal Medicine

## 2021-04-17 ENCOUNTER — Inpatient Hospital Stay (HOSPITAL_COMMUNITY)
Admission: EM | Admit: 2021-04-17 | Discharge: 2021-04-22 | DRG: 388 | Disposition: A | Discharge status: Skilled Nursing Facility | Payer: No Typology Code available for payment source | Attending: Family Medicine | Admitting: Family Medicine

## 2021-04-17 ENCOUNTER — Emergency Department (HOSPITAL_COMMUNITY): Payer: No Typology Code available for payment source

## 2021-04-17 DIAGNOSIS — R4689 Other symptoms and signs involving appearance and behavior: Secondary | ICD-10-CM

## 2021-04-17 DIAGNOSIS — Z8546 Personal history of malignant neoplasm of prostate: Secondary | ICD-10-CM

## 2021-04-17 DIAGNOSIS — Z86718 Personal history of other venous thrombosis and embolism: Secondary | ICD-10-CM

## 2021-04-17 DIAGNOSIS — F0390 Unspecified dementia without behavioral disturbance: Secondary | ICD-10-CM | POA: Diagnosis not present

## 2021-04-17 DIAGNOSIS — Z20822 Contact with and (suspected) exposure to covid-19: Secondary | ICD-10-CM | POA: Diagnosis present

## 2021-04-17 DIAGNOSIS — N184 Chronic kidney disease, stage 4 (severe): Secondary | ICD-10-CM | POA: Diagnosis present

## 2021-04-17 DIAGNOSIS — G9341 Metabolic encephalopathy: Secondary | ICD-10-CM | POA: Diagnosis present

## 2021-04-17 DIAGNOSIS — K5900 Constipation, unspecified: Secondary | ICD-10-CM | POA: Diagnosis not present

## 2021-04-17 DIAGNOSIS — K567 Ileus, unspecified: Principal | ICD-10-CM | POA: Diagnosis present

## 2021-04-17 DIAGNOSIS — Z87891 Personal history of nicotine dependence: Secondary | ICD-10-CM

## 2021-04-17 DIAGNOSIS — I129 Hypertensive chronic kidney disease with stage 1 through stage 4 chronic kidney disease, or unspecified chronic kidney disease: Secondary | ICD-10-CM | POA: Diagnosis present

## 2021-04-17 DIAGNOSIS — R627 Adult failure to thrive: Secondary | ICD-10-CM | POA: Diagnosis present

## 2021-04-17 DIAGNOSIS — Z6826 Body mass index (BMI) 26.0-26.9, adult: Secondary | ICD-10-CM

## 2021-04-17 DIAGNOSIS — F0280 Dementia in other diseases classified elsewhere without behavioral disturbance: Secondary | ICD-10-CM | POA: Diagnosis present

## 2021-04-17 DIAGNOSIS — Z79899 Other long term (current) drug therapy: Secondary | ICD-10-CM

## 2021-04-17 DIAGNOSIS — Z7901 Long term (current) use of anticoagulants: Secondary | ICD-10-CM

## 2021-04-17 DIAGNOSIS — G309 Alzheimer's disease, unspecified: Secondary | ICD-10-CM | POA: Diagnosis present

## 2021-04-17 LAB — POC OCCULT BLOOD, ED: Fecal Occult Bld: NEGATIVE

## 2021-04-17 LAB — BLOOD GAS, VENOUS
Acid-Base Excess: 3.6 mmol/L — ABNORMAL HIGH (ref 0.0–2.0)
Bicarbonate: 29.4 mmol/L — ABNORMAL HIGH (ref 20.0–28.0)
O2 Saturation: 25.6 %
Patient temperature: 98.6
pCO2, Ven: 53 mmHg (ref 44.0–60.0)
pH, Ven: 7.363 (ref 7.250–7.430)
pO2, Ven: <31 mmHg — CL (ref 32.0–45.0)

## 2021-04-17 LAB — COMPREHENSIVE METABOLIC PANEL
ALT: 9 U/L (ref 0–44)
AST: 15 U/L (ref 15–41)
Albumin: 3.5 g/dL (ref 3.5–5.0)
Alkaline Phosphatase: 90 U/L (ref 38–126)
Anion gap: 9 (ref 5–15)
BUN: 23 mg/dL (ref 8–23)
CO2: 27 mmol/L (ref 22–32)
Calcium: 9.4 mg/dL (ref 8.9–10.3)
Chloride: 108 mmol/L (ref 98–111)
Creatinine, Ser: 2.02 mg/dL — ABNORMAL HIGH (ref 0.61–1.24)
GFR, Estimated: 32 mL/min — ABNORMAL LOW (ref >60)
Glucose, Bld: 156 mg/dL — ABNORMAL HIGH (ref 70–99)
Potassium: 4.5 mmol/L (ref 3.5–5.1)
Sodium: 144 mmol/L (ref 135–145)
Total Bilirubin: 0.4 mg/dL (ref 0.3–1.2)
Total Protein: 7.7 g/dL (ref 6.5–8.1)

## 2021-04-17 LAB — CBC WITH DIFFERENTIAL/PLATELET
Abs Immature Granulocytes: 0.05 10*3/uL (ref 0.00–0.07)
Basophils Absolute: 0 10*3/uL (ref 0.0–0.1)
Basophils Relative: 0 %
Eosinophils Absolute: 0.1 10*3/uL (ref 0.0–0.5)
Eosinophils Relative: 1 %
HCT: 36.4 % — ABNORMAL LOW (ref 39.0–52.0)
Hemoglobin: 10.9 g/dL — ABNORMAL LOW (ref 13.0–17.0)
Immature Granulocytes: 1 %
Lymphocytes Relative: 14 %
Lymphs Abs: 1.4 10*3/uL (ref 0.7–4.0)
MCH: 22.1 pg — ABNORMAL LOW (ref 26.0–34.0)
MCHC: 29.9 g/dL — ABNORMAL LOW (ref 30.0–36.0)
MCV: 73.8 fL — ABNORMAL LOW (ref 80.0–100.0)
Monocytes Absolute: 0.7 10*3/uL (ref 0.1–1.0)
Monocytes Relative: 7 %
Neutro Abs: 7.7 10*3/uL (ref 1.7–7.7)
Neutrophils Relative %: 77 %
Platelets: 272 10*3/uL (ref 150–400)
RBC: 4.93 MIL/uL (ref 4.22–5.81)
RDW: 13.6 % (ref 11.5–15.5)
WBC: 9.9 10*3/uL (ref 4.0–10.5)
nRBC: 0 % (ref 0.0–0.2)

## 2021-04-17 LAB — BRAIN NATRIURETIC PEPTIDE: B Natriuretic Peptide: 60.6 pg/mL (ref 0.0–100.0)

## 2021-04-17 LAB — URINALYSIS, ROUTINE W REFLEX MICROSCOPIC
Bacteria, UA: NONE SEEN
Bilirubin Urine: NEGATIVE
Glucose, UA: NEGATIVE mg/dL
Hgb urine dipstick: NEGATIVE
Ketones, ur: NEGATIVE mg/dL
Leukocytes,Ua: NEGATIVE
Nitrite: NEGATIVE
Protein, ur: 30 mg/dL — AB
Specific Gravity, Urine: 1.012 (ref 1.005–1.030)
pH: 6 (ref 5.0–8.0)

## 2021-04-17 LAB — LIPASE, BLOOD: Lipase: 45 U/L (ref 11–51)

## 2021-04-17 LAB — PROTIME-INR
INR: 1.2 (ref 0.8–1.2)
Prothrombin Time: 15 seconds (ref 11.4–15.2)

## 2021-04-17 LAB — TROPONIN I (HIGH SENSITIVITY)
Troponin I (High Sensitivity): 5 ng/L (ref <18)
Troponin I (High Sensitivity): 4 ng/L (ref <18)

## 2021-04-17 LAB — LACTIC ACID, PLASMA: Lactic Acid, Venous: 0.9 mmol/L (ref 0.5–1.9)

## 2021-04-17 MED ORDER — SORBITOL 70 % SOLN
960.0000 mL | TOPICAL_OIL | Freq: Once | ORAL | Status: AC
  Administered 2021-04-17: 960 mL via RECTAL
  Filled 2021-04-17: qty 473

## 2021-04-17 MED ORDER — POLYETHYLENE GLYCOL 3350 17 G PO PACK
17.0000 g | PACK | Freq: Two times a day (BID) | ORAL | Status: DC
  Administered 2021-04-17 – 2021-04-22 (×8): 17 g via ORAL
  Filled 2021-04-17 (×7): qty 1

## 2021-04-17 MED ORDER — IOHEXOL 9 MG/ML PO SOLN
ORAL | Status: AC
  Administered 2021-04-17: 1000 mL via ORAL
  Filled 2021-04-17: qty 1000

## 2021-04-17 MED ORDER — IOHEXOL 9 MG/ML PO SOLN: 1000.0000 mL | ORAL | Status: AC

## 2021-04-17 MED ORDER — HEPARIN SODIUM (PORCINE) 5000 UNIT/ML IJ SOLN
5000.0000 [IU] | Freq: Three times a day (TID) | INTRAMUSCULAR | Status: DC
  Administered 2021-04-17: 5000 [IU] via SUBCUTANEOUS
  Filled 2021-04-17: qty 1

## 2021-04-17 MED ORDER — AMLODIPINE BESYLATE 5 MG PO TABS
10.0000 mg | ORAL_TABLET | Freq: Once | ORAL | Status: AC
  Administered 2021-04-17: 10 mg via ORAL
  Filled 2021-04-17: qty 2

## 2021-04-17 MED ORDER — AMLODIPINE BESYLATE 5 MG PO TABS: 10.0000 mg | ORAL_TABLET | Freq: Once | ORAL | Status: DC

## 2021-04-17 MED ORDER — SENNOSIDES-DOCUSATE SODIUM 8.6-50 MG PO TABS
1.0000 | ORAL_TABLET | Freq: Two times a day (BID) | ORAL | Status: DC
  Administered 2021-04-17 – 2021-04-22 (×9): 1 via ORAL
  Filled 2021-04-17 (×9): qty 1

## 2021-04-17 MED ORDER — SODIUM CHLORIDE 0.9% FLUSH
3.0000 mL | Freq: Two times a day (BID) | INTRAVENOUS | Status: DC
  Administered 2021-04-17 – 2021-04-21 (×7): 3 mL via INTRAVENOUS

## 2021-04-17 MED ORDER — LACTATED RINGERS IV SOLN: INTRAVENOUS | Status: DC

## 2021-04-17 NOTE — ED Notes (Signed)
Placed pt on condom cath

## 2021-04-17 NOTE — ED Provider Notes (Signed)
Patient was initially seen by Dr. Vallery Ridge.  Please see her note.  Plan was to follow-up on the CT scan.  She has also placed a social work consult as it appears the family has been having some increased difficulty with him at home     CT Abdomen Pelvis Wo Contrast  Result Date: 04/17/2021 CLINICAL DATA:  Constipation for 1 month. History of lung cancer and prostate surgery. Decreased renal function. EXAM: CT ABDOMEN AND PELVIS WITHOUT CONTRAST TECHNIQUE: Multidetector CT imaging of the abdomen and pelvis was performed following the standard protocol without IV contrast. COMPARISON:  04/15/2021 FINDINGS: Lower chest: Small bilateral pleural effusions with basilar atelectasis. Coronary artery calcifications. Hepatobiliary: Sludge and stones in the gallbladder. No gallbladder wall thickening or stranding. No bile duct dilatation. No focal liver lesions. Pancreas: Unremarkable. No pancreatic ductal dilatation or surrounding inflammatory changes. Spleen: Normal in size without focal abnormality. Adrenals/Urinary Tract: Left adrenal gland nodule measuring 2.9 cm diameter. Density measurements of about 7 Hounsfield units consistent with benign adenoma. No change since prior study. Multiple bilateral renal cysts including a right upper pole cyst measuring 9.5 cm diameter. No hydronephrosis or hydroureter. No renal stones identified. Bladder is unremarkable. Stomach/Bowel: Stomach and proximal small bowel are contrast filled. Mildly dilated proximal small bowel in comparison to the distal bowel, possibly related to ileus although this may just be due to the presence of contrast material proximally and not distally. Stool fills the colon. No wall thickening or inflammatory changes are suggested. Appendix is not identified. Vascular/Lymphatic: Aortic atherosclerosis. No enlarged abdominal or pelvic lymph nodes. Reproductive: Prostate gland is surgically absent. Surgical clips in the pelvis. No significant  lymphadenopathy. Other: No free air or free fluid in the abdomen. Musculoskeletal: Asymmetric appearance of the left pelvis again demonstrated without change. Degenerative changes in the spine and hips. IMPRESSION: 1. Small bilateral pleural effusions with basilar atelectasis. 2. Sludge and stones in the gallbladder without evidence of cholecystitis. 3. Multiple bilateral renal cysts. 4. Left adrenal gland nodule consistent with benign adenoma. 5. Mildly dilated proximal small bowel in comparison to the distal bowel, possibly related to ileus or early obstruction although this may just be due to the presence of contrast material proximally and not distally. 6. Stool fills the colon consistent with history of constipation. 7. Asymmetric appearance of the left pelvis again demonstrated without change. Aortic Atherosclerosis (ICD10-I70.0). Electronically Signed   By: Lucienne Capers M.D.   On: 04/17/2021 20:05   Clinical Course as of 04/17/21 2048  Mon Apr 17, 2021  2013 CT scan shows several findings including possible ileus versus early obstruction [JK]  2014 Considering the patient's complaint of constipation think it would be reasonable to bring him in for bowel management and monitoring [JK]    Clinical Course User Index [JK] Dorie Rank, MD      Dorie Rank, MD 04/17/21 2048

## 2021-04-17 NOTE — ED Triage Notes (Signed)
Ems brings pt in from home for constipation for about 1 month. Last BM 2 days ago. Family reports pt has been wanting to stay in bed more over the past 2 days and not wanting to take his meds.

## 2021-04-17 NOTE — ED Provider Notes (Signed)
[facility] DEPT Provider Note   CSN: [id number] Arrival date & time: 04/17/21  1315     History Chief Complaint  Patient presents with  . Constipation    [name] [name] is a 87 y.o. male.  HPI Patient's wife reports that since being seen in the emergency department 2 days ago, her husband has not allowed her to do anything to care for him.  He was diagnosed with constipation and recommendations were made for taking MiraLAX.  Patient has not allowed his wife to move him off of his diaper.  She reports he is laying in a saturated diaper for 2 days.  She reports he will not accept any food from her.  He is refusing medications.  She reports he has Alzheimer's dementia.  She reports he is expressing to her that he has pain in his abdomen and upper abdomen but if anybody else asks him, he just looks at her and does not answer.    Past Medical History:  Diagnosis Date  . Cancer [facility])    prostate  . Gallstones   . Hypertension   . Stone, kidney   . Stone, kidney     Patient Active Problem List   Diagnosis Date Noted  . Palliative care by specialist   . DVT (deep venous thrombosis) ([place]) 12/27/2020  . Acute kidney injury superimposed on chronic kidney disease ([place]) 03/26/2016  . Syncope and collapse 11/06/2015  . Hypertension 11/06/2015  . Carotid sinus syncope or syndrome 11/06/2015  . Acute renal failure syndrome ([place])   . CKD (chronic kidney disease) stage 4, GFR 15-29 ml/min ([facility]) 03/27/2015  . Anemia of chronic disease 03/27/2015  . Syncope 12/02/2011  . HTN (hypertension) 12/02/2011    Past Surgical History:  Procedure Laterality Date  . IR RADIOLOGIST EVAL & MGMT  01/12/2021  . PROSTATE SURGERY         Family History  Problem Relation Age of Onset  . Lung cancer Sister     Social History   Tobacco Use  . Smoking status: Former Smoker    Packs/day: 0.50    Years: 20.00    Pack years: 10.00    Quit date: 12/02/1972    Years  since quitting: 48.4  . Smokeless tobacco: Never Used  Substance Use Topics  . Alcohol use: No  . Drug use: No    Home Medications Prior to Admission medications   Medication Sig Start Date End Date Taking? Authorizing Provider  amLODipine (NORVASC) 10 MG tablet Take 10 mg by mouth daily.    [provider]  apixaban (ELIQUIS) 5 MG TABS tablet Take 2 tablets (10 mg total) by mouth 2 (two) times daily for 3 days. 01/03/21 01/06/21  Shelly Coss, MD  apixaban (ELIQUIS) 5 MG TABS tablet Take 1 tablet (5 mg total) by mouth 2 (two) times daily. 01/06/21   Shelly Coss, MD  cephALEXin (KEFLEX) 500 MG capsule Take 1 capsule (500 mg total) by mouth 3 (three) times daily. 03/29/21   Varney Biles, MD  diclofenac Sodium (VOLTAREN) 1 % GEL Apply 4 g topically 4 (four) times daily as needed (pain).    [provider]  feeding supplement, GLUCERNA SHAKE, (GLUCERNA SHAKE) LIQD Take 237 mLs by mouth daily.    [provider]  ipratropium (ATROVENT) 0.03 % nasal spray Place 2 sprays into both nostrils every 6 (six) hours.    [provider]  LORazepam (ATIVAN) 0.5 MG tablet Take 1 tablet (0.5 mg total)  by mouth every 8 (eight) hours as needed for anxiety. 01/03/21   Shelly Coss, MD  memantine (NAMENDA) 10 MG tablet Take 10 mg by mouth 2 (two) times daily.    [provider]  mirabegron ER (MYRBETRIQ) 50 MG TB24 tablet Take 50 mg by mouth daily.    [provider]  polyethylene glycol powder (MIRALAX) 17 GM/SCOOP powder Please take 6 capfuls of MiraLAX in a 32 oz bottle of Gatorade over 2-4 hour period. The following day take 3 capfuls. On day 3 start taking 1 capful 3 times a day. Slowly cut back as needed until you have normal bowel movements. 04/15/21   Fatima Blank, MD  QUEtiapine (SEROQUEL) 100 MG tablet Take 50 mg by mouth in the morning, at noon, in the evening, and at bedtime.    [provider]    Allergies    Patient has  no known allergies.  Review of Systems   Review of Systems Level 5 caveat cannot obtain review of systems due to dementia Physical Exam Updated Vital Signs BP (!) 155/130   Pulse (!) 59   Temp 98 F (36.7 C) (Oral)   Resp 16   SpO2 98%   Physical Exam Constitutional:      Comments: Very debilitated appearing 87 year old.  No respiratory distress at rest.  HENT:     Head: Normocephalic and atraumatic.     Mouth/Throat:     Pharynx: Oropharynx is clear.  Eyes:     Extraocular Movements: Extraocular movements intact.  Cardiovascular:     Rate and Rhythm: Normal rate and regular rhythm.  Pulmonary:     Effort: Pulmonary effort is normal.     Breath sounds: Normal breath sounds.  Abdominal:     Comments: Abdomen soft.  Mild to moderately distended.  Genitourinary:    Penis: Normal.      Comments: Normal-appearing penis.  No breakdown of the buttocks or sacrum.  Stool exam no stool impaction.  Stool is brown and formed.  mobile in the vault. Musculoskeletal:     Comments: 1+ edema bilateral legs.  Skin:    General: Skin is warm and dry.     Coloration: Skin is pale.  Neurological:     Comments: Patient is very limited in his verbal responses.  He makes some attempts at response but generally just smiles and says a few words that do not necessarily make sense.  He has difficulty following commands.     ED Results / Procedures / Treatments   Labs (all labs ordered are listed, but only abnormal results are displayed) Labs Reviewed  COMPREHENSIVE METABOLIC PANEL - Abnormal; Notable for the following components:      Result Value   Glucose, Bld 156 (*)    Creatinine, Ser 2.02 (*)    GFR, Estimated 32 (*)    All other components within normal limits  CBC WITH DIFFERENTIAL/PLATELET - Abnormal; Notable for the following components:   Hemoglobin 10.9 (*)    HCT 36.4 (*)    MCV 73.8 (*)    MCH 22.1 (*)    MCHC 29.9 (*)    All other components within normal limits   URINALYSIS, ROUTINE W REFLEX MICROSCOPIC - Abnormal; Notable for the following components:   Protein, ur 30 (*)    All other components within normal limits  BLOOD GAS, VENOUS - Abnormal; Notable for the following components:   pO2, Ven <31.0 (*)    Bicarbonate 29.4 (*)    Acid-Base  Excess 3.6 (*)    All other components within normal limits  LIPASE, BLOOD  LACTIC ACID, PLASMA  BRAIN NATRIURETIC PEPTIDE  PROTIME-INR  LACTIC ACID, PLASMA  POC OCCULT BLOOD, ED  TROPONIN I (HIGH SENSITIVITY)  TROPONIN I (HIGH SENSITIVITY)    EKG EKG Interpretation  Date/Time:  Monday April 17 2021 13:32:07 EDT Ventricular Rate:  68 PR Interval:  162 QRS Duration: 99 QT Interval:  443 QTC Calculation: 472 R Axis:   10 Text Interpretation: Sinus rhythm Atrial premature complex no sig change from previous Confirmed by Charlesetta Shanks [phone]) on 04/17/2021 3:28:36 PM   Radiology No results found.  Procedures Procedures   Medications Ordered in ED Medications  lactated ringers infusion ( Intravenous New Bag/Given 04/17/21 1515)  sorbitol, milk of mag, mineral oil, glycerin (SMOG) enema (has no administration in time range)  amLODipine (NORVASC) tablet 10 mg (has no administration in time range)    ED Course  I have reviewed the triage vital signs and the nursing notes.  Pertinent labs & imaging results that were available during my care of the patient were reviewed by me and considered in my medical decision making (see chart for details).    MDM Rules/Calculators/A&P                          Patient's wife is the primary caregiver at home.  She reports that the patient has begun to refuse care.  He would not eat or take any medications.  She reports that yesterday she only got him to eat a couple bites of oatmeal.  She reports he would not allow her to change his diapers.  He reports that he sat in urine for 2 days due to refusal to assist at all or allow her to reposition him or change  him.  She reports she is concerned for severe constipation.  She reports that they were seen in the emergency department 2 days ago and told he had constipation and he will not take any of the medications or allow her to assist him.  Clinically, the patient is calm but clearly has dementia.  He is very limited in his ability to assist and appears to be limited in ability to comprehend requests although when asked to roll on his side with coaching he would assist somewhat.  At this time we will repeat CT to confirm no obstruction given prior read of ileus.  Patient has no fecal impaction.  Rectal exam is normal with some mobile stool in the vault.  There is no skin breakdown in the sacrum or buttocks.  Patient is clearly very limited mobility.  Social work consult placed for added assistance at home.  Also consideration for SNF placement. Final Clinical Impression(s) / ED Diagnoses Final diagnoses:  Constipation, unspecified constipation type  Failure to thrive in adult  Alzheimer's dementia without behavioral disturbance, unspecified timing of dementia onset ([place])  Uncooperative behavior    Rx / DC Orders ED Discharge Orders    None       Charlesetta Shanks, MD 04/17/21 1715

## 2021-04-17 NOTE — ED Notes (Signed)
Pt cleaned, large BM

## 2021-04-17 NOTE — H&P (Incomplete)
History and Physical    [name] [name]  UXL:[id number]  DOB: 02-Apr-1933  DOA: 04/17/2021  PCP: [place] Patient coming from: home  Chief Complaint: "constipation"  HPI:    I have personally briefly reviewed patient's old medical records in [facility] and discussed patient with the ER provider when appropriate/indicated.  Assessment/Plan:    Code Status: ***    Code Status: Full Code  DVT Prophylaxis: ***  heparin injection 5,000 Units Start: 04/17/21 2200   Anticipated disposition is to: ***  History: Past Medical History:  Diagnosis Date  . Cancer [facility])    prostate  . Gallstones   . Hypertension   . Stone, kidney   . Stone, kidney     Past Surgical History:  Procedure Laterality Date  . IR RADIOLOGIST EVAL & MGMT  01/12/2021  . PROSTATE SURGERY       reports that he quit smoking about 48 years ago. He has a 10.00 pack-year smoking history. He has never used smokeless tobacco. He reports that he does not drink alcohol and does not use drugs.  No Known Allergies  Family History  Problem Relation Age of Onset  . Lung cancer Sister    *** Home Medications: Prior to Admission medications   Medication Sig Start Date End Date Taking? Authorizing Provider  amLODipine (NORVASC) 10 MG tablet Take 10 mg by mouth daily.   Yes [provider]  apixaban (ELIQUIS) 5 MG TABS tablet Take 1 tablet (5 mg total) by mouth 2 (two) times daily. 01/06/21  Yes Shelly Coss, MD  diclofenac Sodium (VOLTAREN) 1 % GEL Apply 4 g topically 4 (four) times daily as needed (pain).   Yes [provider]  feeding supplement, GLUCERNA SHAKE, (GLUCERNA SHAKE) LIQD Take 237 mLs by mouth daily.   Yes [provider]  ipratropium (ATROVENT) 0.03 % nasal spray Place 2 sprays into both nostrils every 6 (six) hours.   Yes [provider]  LORazepam (ATIVAN) 0.5 MG tablet Take 1 tablet (0.5 mg total) by mouth every 8 (eight) hours as needed for  anxiety. 01/03/21  Yes Shelly Coss, MD  memantine (NAMENDA) 10 MG tablet Take 10 mg by mouth 2 (two) times daily.   Yes [provider]  mirabegron ER (MYRBETRIQ) 50 MG TB24 tablet Take 50 mg by mouth daily.   Yes [provider]  Multiple Vitamin (MULTIVITAMIN WITH MINERALS) TABS tablet Take 1 tablet by mouth daily.   Yes [provider]  QUEtiapine (SEROQUEL) 100 MG tablet Take 50 mg by mouth in the morning, at noon, in the evening, and at bedtime.   Yes [provider]  apixaban (ELIQUIS) 5 MG TABS tablet Take 2 tablets (10 mg total) by mouth 2 (two) times daily for 3 days. 01/03/21 01/06/21  Shelly Coss, MD  cephALEXin (KEFLEX) 500 MG capsule Take 1 capsule (500 mg total) by mouth 3 (three) times daily. Patient not taking: No sig reported 03/29/21   Varney Biles, MD  polyethylene glycol powder (MIRALAX) 17 GM/SCOOP powder Please take 6 capfuls of MiraLAX in a 32 oz bottle of Gatorade over 2-4 hour period. The following day take 3 capfuls. On day 3 start taking 1 capful 3 times a day. Slowly cut back as needed until you have normal bowel movements. 04/15/21   Fatima Blank, MD    Review of Systems:  {ROS - complete:30496:a}  Physical Exam: Vitals:   04/17/21 2130 04/17/21 2145 04/17/21 2215 04/17/21 2235  BP: Marland Kitchen)  151/89 (!) 165/95 (!) 175/101 (!) 154/120  Pulse:    65  Resp: 16 12 20 16   Temp:      TempSrc:      SpO2:  97% 98% 98%   {Exam; complete male:17872}  Labs on Admission:  I have personally reviewed following labs and imaging studies Results for orders placed or performed during the hospital encounter of 04/17/21 (from the past 24 hour(s))  Comprehensive metabolic panel     Status: Abnormal   Collection Time: 04/17/21  1:00 PM  Result Value Ref Range   Sodium 144 135 - 145 mmol/L   Potassium 4.5 3.5 - 5.1 mmol/L   Chloride 108 98 - 111 mmol/L   CO2 27 22 - 32 mmol/L   Glucose, Bld 156 (H) 70 - 99 mg/dL   BUN 23 8 - 23  mg/dL   Creatinine, Ser 2.02 (H) 0.61 - 1.24 mg/dL   Calcium 9.4 8.9 - 10.3 mg/dL   Total Protein 7.7 6.5 - 8.1 g/dL   Albumin 3.5 3.5 - 5.0 g/dL   AST 15 15 - 41 U/L   ALT 9 0 - 44 U/L   Alkaline Phosphatase 90 38 - 126 U/L   Total Bilirubin 0.4 0.3 - 1.2 mg/dL   GFR, Estimated 32 (L) >60 mL/min   Anion gap 9 5 - 15  Lipase, blood     Status: None   Collection Time: 04/17/21  1:00 PM  Result Value Ref Range   Lipase 45 11 - 51 U/L  Troponin I (High Sensitivity)     Status: None   Collection Time: 04/17/21  1:00 PM  Result Value Ref Range   Troponin I (High Sensitivity) 5 <18 ng/L  Brain natriuretic peptide     Status: None   Collection Time: 04/17/21  1:00 PM  Result Value Ref Range   B Natriuretic Peptide 60.6 0.0 - 100.0 pg/mL  CBC with Differential     Status: Abnormal   Collection Time: 04/17/21  1:00 PM  Result Value Ref Range   WBC 9.9 4.0 - 10.5 K/uL   RBC 4.93 4.22 - 5.81 MIL/uL   Hemoglobin 10.9 (L) 13.0 - 17.0 g/dL   HCT 36.4 (L) 39.0 - 52.0 %   MCV 73.8 (L) 80.0 - 100.0 fL   MCH 22.1 (L) 26.0 - 34.0 pg   MCHC 29.9 (L) 30.0 - 36.0 g/dL   RDW 13.6 11.5 - 15.5 %   Platelets 272 150 - 400 K/uL   nRBC 0.0 0.0 - 0.2 %   Neutrophils Relative % 77 %   Neutro Abs 7.7 1.7 - 7.7 K/uL   Lymphocytes Relative 14 %   Lymphs Abs 1.4 0.7 - 4.0 K/uL   Monocytes Relative 7 %   Monocytes Absolute 0.7 0.1 - 1.0 K/uL   Eosinophils Relative 1 %   Eosinophils Absolute 0.1 0.0 - 0.5 K/uL   Basophils Relative 0 %   Basophils Absolute 0.0 0.0 - 0.1 K/uL   Immature Granulocytes 1 %   Abs Immature Granulocytes 0.05 0.00 - 0.07 K/uL  Lactic acid, plasma     Status: None   Collection Time: 04/17/21  2:15 PM  Result Value Ref Range   Lactic Acid, Venous 0.9 0.5 - 1.9 mmol/L  Protime-INR     Status: None   Collection Time: 04/17/21  2:19 PM  Result Value Ref Range   Prothrombin Time 15.0 11.4 - 15.2 seconds   INR 1.2 0.8 - 1.2  Blood  gas, venous     Status: Abnormal   Collection  Time: 04/17/21  3:18 PM  Result Value Ref Range   pH, Ven 7.363 7.250 - 7.430   pCO2, Ven 53.0 44.0 - 60.0 mmHg   pO2, Ven <31.0 (LL) 32.0 - 45.0 mmHg   Bicarbonate 29.4 (H) 20.0 - 28.0 mmol/L   Acid-Base Excess 3.6 (H) 0.0 - 2.0 mmol/L   O2 Saturation 25.6 %   Patient temperature 98.6   Urinalysis, Routine w reflex microscopic Urine, Clean Catch     Status: Abnormal   Collection Time: 04/17/21  3:34 PM  Result Value Ref Range   Color, Urine YELLOW YELLOW   APPearance CLEAR CLEAR   Specific Gravity, Urine 1.012 1.005 - 1.030   pH 6.0 5.0 - 8.0   Glucose, UA NEGATIVE NEGATIVE mg/dL   Hgb urine dipstick NEGATIVE NEGATIVE   Bilirubin Urine NEGATIVE NEGATIVE   Ketones, ur NEGATIVE NEGATIVE mg/dL   Protein, ur 30 (A) NEGATIVE mg/dL   Nitrite NEGATIVE NEGATIVE   Leukocytes,Ua NEGATIVE NEGATIVE   RBC / HPF 0-5 0 - 5 RBC/hpf   WBC, UA 0-5 0 - 5 WBC/hpf   Bacteria, UA NONE SEEN NONE SEEN   Squamous Epithelial / LPF 0-5 0 - 5   Mucus PRESENT   Troponin I (High Sensitivity)     Status: None   Collection Time: 04/17/21  5:30 PM  Result Value Ref Range   Troponin I (High Sensitivity) 4 <18 ng/L  POC occult blood, ED     Status: None   Collection Time: 04/17/21  6:08 PM  Result Value Ref Range   Fecal Occult Bld NEGATIVE NEGATIVE     Radiological Exams on Admission: CT Abdomen Pelvis Wo Contrast  Result Date: 04/17/2021 CLINICAL DATA:  Constipation for 1 month. History of lung cancer and prostate surgery. Decreased renal function. EXAM: CT ABDOMEN AND PELVIS WITHOUT CONTRAST TECHNIQUE: Multidetector CT imaging of the abdomen and pelvis was performed following the standard protocol without IV contrast. COMPARISON:  04/15/2021 FINDINGS: Lower chest: Small bilateral pleural effusions with basilar atelectasis. Coronary artery calcifications. Hepatobiliary: Sludge and stones in the gallbladder. No gallbladder wall thickening or stranding. No bile duct dilatation. No focal liver lesions.  Pancreas: Unremarkable. No pancreatic ductal dilatation or surrounding inflammatory changes. Spleen: Normal in size without focal abnormality. Adrenals/Urinary Tract: Left adrenal gland nodule measuring 2.9 cm diameter. Density measurements of about 7 Hounsfield units consistent with benign adenoma. No change since prior study. Multiple bilateral renal cysts including a right upper pole cyst measuring 9.5 cm diameter. No hydronephrosis or hydroureter. No renal stones identified. Bladder is unremarkable. Stomach/Bowel: Stomach and proximal small bowel are contrast filled. Mildly dilated proximal small bowel in comparison to the distal bowel, possibly related to ileus although this may just be due to the presence of contrast material proximally and not distally. Stool fills the colon. No wall thickening or inflammatory changes are suggested. Appendix is not identified. Vascular/Lymphatic: Aortic atherosclerosis. No enlarged abdominal or pelvic lymph nodes. Reproductive: Prostate gland is surgically absent. Surgical clips in the pelvis. No significant lymphadenopathy. Other: No free air or free fluid in the abdomen. Musculoskeletal: Asymmetric appearance of the left pelvis again demonstrated without change. Degenerative changes in the spine and hips. IMPRESSION: 1. Small bilateral pleural effusions with basilar atelectasis. 2. Sludge and stones in the gallbladder without evidence of cholecystitis. 3. Multiple bilateral renal cysts. 4. Left adrenal gland nodule consistent with benign adenoma. 5. Mildly dilated proximal small bowel in  comparison to the distal bowel, possibly related to ileus or early obstruction although this may just be due to the presence of contrast material proximally and not distally. 6. Stool fills the colon consistent with history of constipation. 7. Asymmetric appearance of the left pelvis again demonstrated without change. Aortic Atherosclerosis (ICD10-I70.0). Electronically Signed   By: Lucienne Capers M.D.   On: 04/17/2021 20:05   CT Abdomen Pelvis Wo Contrast  Final Result      Consults called:  ***   EKG: Independently reviewed. ***   Dwyane Dee, MD [facility] 04/17/2021, 11:19 PM

## 2021-04-18 ENCOUNTER — Observation Stay (HOSPITAL_COMMUNITY): Payer: No Typology Code available for payment source

## 2021-04-18 ENCOUNTER — Other Ambulatory Visit: Payer: Self-pay

## 2021-04-18 ENCOUNTER — Encounter (HOSPITAL_COMMUNITY): Payer: Self-pay | Admitting: Internal Medicine

## 2021-04-18 DIAGNOSIS — F0390 Unspecified dementia without behavioral disturbance: Secondary | ICD-10-CM

## 2021-04-18 DIAGNOSIS — K567 Ileus, unspecified: Secondary | ICD-10-CM | POA: Diagnosis not present

## 2021-04-18 DIAGNOSIS — Z86718 Personal history of other venous thrombosis and embolism: Secondary | ICD-10-CM

## 2021-04-18 LAB — BASIC METABOLIC PANEL
Anion gap: 7 (ref 5–15)
BUN: 17 mg/dL (ref 8–23)
CO2: 27 mmol/L (ref 22–32)
Calcium: 8.9 mg/dL (ref 8.9–10.3)
Chloride: 103 mmol/L (ref 98–111)
Creatinine, Ser: 1.64 mg/dL — ABNORMAL HIGH (ref 0.61–1.24)
GFR, Estimated: 40 mL/min — ABNORMAL LOW (ref >60)
Glucose, Bld: 134 mg/dL — ABNORMAL HIGH (ref 70–99)
Potassium: 4.4 mmol/L (ref 3.5–5.1)
Sodium: 137 mmol/L (ref 135–145)

## 2021-04-18 LAB — VITAMIN B12: Vitamin B-12: 748 pg/mL (ref 180–914)

## 2021-04-18 LAB — GLUCOSE, CAPILLARY: Glucose-Capillary: 140 mg/dL — ABNORMAL HIGH (ref 70–99)

## 2021-04-18 LAB — TSH: TSH: 5.857 u[IU]/mL — ABNORMAL HIGH (ref 0.350–4.500)

## 2021-04-18 LAB — SARS CORONAVIRUS 2 (TAT 6-24 HRS): SARS Coronavirus 2: NEGATIVE

## 2021-04-18 MED ORDER — ADULT MULTIVITAMIN W/MINERALS CH
1.0000 | ORAL_TABLET | Freq: Every day | ORAL | Status: DC
  Administered 2021-04-18 – 2021-04-22 (×5): 1 via ORAL
  Filled 2021-04-18 (×5): qty 1

## 2021-04-18 MED ORDER — FOLIC ACID 5 MG/ML IJ SOLN
1.0000 mg | Freq: Every day | INTRAMUSCULAR | Status: DC
  Administered 2021-04-18: 1 mg via INTRAVENOUS
  Filled 2021-04-18 (×2): qty 0.2

## 2021-04-18 MED ORDER — MEMANTINE HCL 10 MG PO TABS
10.0000 mg | ORAL_TABLET | Freq: Two times a day (BID) | ORAL | Status: DC
  Administered 2021-04-18 – 2021-04-22 (×9): 10 mg via ORAL
  Filled 2021-04-18 (×9): qty 1

## 2021-04-18 MED ORDER — GLUCERNA SHAKE PO LIQD
237.0000 mL | Freq: Every day | ORAL | Status: DC
  Administered 2021-04-19 – 2021-04-20 (×2): 237 mL via ORAL
  Filled 2021-04-18 (×3): qty 237

## 2021-04-18 MED ORDER — QUETIAPINE FUMARATE 25 MG PO TABS
50.0000 mg | ORAL_TABLET | Freq: Four times a day (QID) | ORAL | Status: DC
  Administered 2021-04-18 – 2021-04-22 (×9): 50 mg via ORAL
  Filled 2021-04-18 (×10): qty 2

## 2021-04-18 MED ORDER — APIXABAN 5 MG PO TABS
5.0000 mg | ORAL_TABLET | Freq: Two times a day (BID) | ORAL | Status: DC
  Administered 2021-04-18 – 2021-04-22 (×9): 5 mg via ORAL
  Filled 2021-04-18 (×9): qty 1

## 2021-04-18 MED ORDER — AMLODIPINE BESYLATE 10 MG PO TABS
10.0000 mg | ORAL_TABLET | Freq: Every day | ORAL | Status: DC
  Administered 2021-04-18 – 2021-04-22 (×5): 10 mg via ORAL
  Filled 2021-04-18 (×5): qty 1

## 2021-04-18 MED ORDER — MIRABEGRON ER 25 MG PO TB24
50.0000 mg | ORAL_TABLET | Freq: Every day | ORAL | Status: DC
  Administered 2021-04-18 – 2021-04-22 (×5): 50 mg via ORAL
  Filled 2021-04-18 (×5): qty 2

## 2021-04-18 MED ORDER — THIAMINE HCL 100 MG/ML IJ SOLN
100.0000 mg | Freq: Every day | INTRAMUSCULAR | Status: DC
  Administered 2021-04-18: 100 mg via INTRAVENOUS
  Filled 2021-04-18 (×2): qty 2

## 2021-04-18 MED ORDER — LORAZEPAM 0.5 MG PO TABS: 0.5000 mg | ORAL_TABLET | Freq: Three times a day (TID) | ORAL | Status: DC | PRN

## 2021-04-18 NOTE — Progress Notes (Signed)
PT Cancellation Note  Patient Details Name: Zeven Kocak MRN: [id number] DOB: 04-Oct-1933   Cancelled Treatment:    Reason Eval/Treat Not Completed: Fatigue/lethargy limiting ability to participate, per RN, heavy + 2 assist for bed mobility, patient sleeping. Will check back tomorrow.   Claretha Cooper 04/18/2021, 3:51 PM  [place] Pager [phone] Office [phone]

## 2021-04-18 NOTE — H&P (Addendum)
History and Physical    [name] [name]  BUL:[id number]  DOB: 05-May-1933  DOA: 04/17/2021  PCP: Dol[facility] Patient coming from: home  Chief Complaint: "constipation"  HPI:  Mr. Schliep is an 87 year old male with PMH dementia, hypertension, prostate cancer, CKD 4, nephrolithiasis who presented to the ER with complaints of generalized weakness and ongoing constipation.  He is accompanied by his wife in the ER who helps provide further information as the patient cannot provide any collateral information.  She states that he has become more dependent on ADLs and she has had more difficulty caring for him at home.  Due to this along with worsening constipation, she brought him to the ER for evaluation. He has had no complaints of nausea or vomiting.  There have been no complaints of abdominal pain.  His appetite has been decreased however the day of admission he was asking his wife for food and was endorsing that he was hungry for the first time in a couple days.  In the ER he underwent CT abdomen/pelvis which showed a mildly dilated proximal small bowel compared to the distal small bowel.  There was possible concern for this being ileus or early obstruction or possibly due to presence of contrast material proximally and not distally. Patient was given an enema in the ER and had a large bowel movement. He was admitted for further monitoring and ongoing laxative treatment for underlying constipation.  His wife stated she had not given much thoughts to the patient requiring long-term care if she is unable to continue providing.  Discussed with her that we would have physical therapy evaluate and continue discussions regarding his disposition.  I have personally briefly reviewed patient's old medical records in [facility] and discussed patient with the ER provider when appropriate/indicated.  Assessment/Plan:   Constipation - low suspicion for ileus or early SBO; CT read may be overread  currently; he has no N/V/abd pain; he also responded rapidly to enema in the ER and has appropriate bowel sounds with no TTP - continue miralax and senna  Physical deconditioning Dementia - appears patient is more dependent on ADLs and his wife is having more difficulty caring for him; she states she is not quite ready to consider long-term placement but this might be necessary - Follow-up PT/OT evaluations - continue namenda, ativan, seroquel  Hx DVT - per duplex 01/12/21: LLE DVT involving CFV, saphenofemoral junction, profunda vein, popliteal vein - Continue Eliquis  HTN - continue amlodipine   Code Status:     Code Status: Full Code  DVT Prophylaxis:   Eliquis apixaban (ELIQUIS) tablet 5 mg   Anticipated disposition is to: pending PT/OT evals and discussion with wife  History: Past Medical History:  Diagnosis Date  . Cancer [facility])    prostate  . Gallstones   . Hypertension   . Stone, kidney   . Stone, kidney     Past Surgical History:  Procedure Laterality Date  . IR RADIOLOGIST EVAL & MGMT  01/12/2021  . PROSTATE SURGERY       reports that he quit smoking about 48 years ago. He has a 10.00 pack-year smoking history. He has never used smokeless tobacco. He reports that he does not drink alcohol and does not use drugs.  No Known Allergies  Family History  Problem Relation Age of Onset  . Lung cancer Sister    Home Medications: Prior to Admission medications   Medication Sig Start Date End Date Taking? Authorizing Provider  amLODipine (NORVASC) 10 MG tablet Take 10 mg by mouth daily.   Yes [provider]  apixaban (ELIQUIS) 5 MG TABS tablet Take 1 tablet (5 mg total) by mouth 2 (two) times daily. 01/06/21  Yes Shelly Coss, MD  diclofenac Sodium (VOLTAREN) 1 % GEL Apply 4 g topically 4 (four) times daily as needed (pain).   Yes [provider]  feeding supplement, GLUCERNA SHAKE, (GLUCERNA SHAKE) LIQD Take 237 mLs by mouth daily.   Yes  [provider]  ipratropium (ATROVENT) 0.03 % nasal spray Place 2 sprays into both nostrils every 6 (six) hours.   Yes [provider]  LORazepam (ATIVAN) 0.5 MG tablet Take 1 tablet (0.5 mg total) by mouth every 8 (eight) hours as needed for anxiety. 01/03/21  Yes Shelly Coss, MD  memantine (NAMENDA) 10 MG tablet Take 10 mg by mouth 2 (two) times daily.   Yes [provider]  mirabegron ER (MYRBETRIQ) 50 MG TB24 tablet Take 50 mg by mouth daily.   Yes [provider]  Multiple Vitamin (MULTIVITAMIN WITH MINERALS) TABS tablet Take 1 tablet by mouth daily.   Yes [provider]  QUEtiapine (SEROQUEL) 100 MG tablet Take 50 mg by mouth in the morning, at noon, in the evening, and at bedtime.   Yes [provider]  apixaban (ELIQUIS) 5 MG TABS tablet Take 2 tablets (10 mg total) by mouth 2 (two) times daily for 3 days. 01/03/21 01/06/21  Shelly Coss, MD  cephALEXin (KEFLEX) 500 MG capsule Take 1 capsule (500 mg total) by mouth 3 (three) times daily. Patient not taking: No sig reported 03/29/21   Varney Biles, MD  polyethylene glycol powder (MIRALAX) 17 GM/SCOOP powder Please take 6 capfuls of MiraLAX in a 32 oz bottle of Gatorade over 2-4 hour period. The following day take 3 capfuls. On day 3 start taking 1 capful 3 times a day. Slowly cut back as needed until you have normal bowel movements. 04/15/21   Cardama, Grayce Sessions, MD    Review of Systems:  Review of systems not obtained due to patient factors. Cognitive impairment   Physical Exam: Vitals:   04/17/21 2330 04/18/21 0104 04/18/21 0105 04/18/21 0510  BP: (!) 172/103 (!) 135/100  (!) 158/95  Pulse: 73 69  65  Resp: 16 18  18   Temp:  99.2 F (37.3 C)  98.6 F (37 C)  TempSrc:  Oral  Oral  SpO2: 99% 100%  99%  Weight:   89.9 kg   Height:   6' (1.829 m)    General appearance: Chronically ill-appearing but pleasant elderly man resting in bed in no distress Head:  Normocephalic, without obvious abnormality, atraumatic Eyes: EOMI Lungs: clear to auscultation bilaterally Heart: regular rate and rhythm and S1, S2 normal Abdomen: soft, NT, ND, BS present Extremities: no edema Skin: mobility and turgor normal Neurologic: moves all 4 extremities; follows commands  Labs on Admission:  I have personally reviewed following labs and imaging studies Results for orders placed or performed during the hospital encounter of 04/17/21 (from the past 24 hour(s))  Comprehensive metabolic panel     Status: Abnormal   Collection Time: 04/17/21  1:00 PM  Result Value Ref Range   Sodium 144 135 - 145 mmol/L   Potassium 4.5 3.5 - 5.1 mmol/L   Chloride 108 98 - 111 mmol/L   CO2 27 22 - 32 mmol/L   Glucose, Bld 156 (H) 70 - 99 mg/dL   BUN 23 8 -  23 mg/dL   Creatinine, Ser 2.02 (H) 0.61 - 1.24 mg/dL   Calcium 9.4 8.9 - 10.3 mg/dL   Total Protein 7.7 6.5 - 8.1 g/dL   Albumin 3.5 3.5 - 5.0 g/dL   AST 15 15 - 41 U/L   ALT 9 0 - 44 U/L   Alkaline Phosphatase 90 38 - 126 U/L   Total Bilirubin 0.4 0.3 - 1.2 mg/dL   GFR, Estimated 32 (L) >60 mL/min   Anion gap 9 5 - 15  Lipase, blood     Status: None   Collection Time: 04/17/21  1:00 PM  Result Value Ref Range   Lipase 45 11 - 51 U/L  Troponin I (High Sensitivity)     Status: None   Collection Time: 04/17/21  1:00 PM  Result Value Ref Range   Troponin I (High Sensitivity) 5 <18 ng/L  Brain natriuretic peptide     Status: None   Collection Time: 04/17/21  1:00 PM  Result Value Ref Range   B Natriuretic Peptide 60.6 0.0 - 100.0 pg/mL  CBC with Differential     Status: Abnormal   Collection Time: 04/17/21  1:00 PM  Result Value Ref Range   WBC 9.9 4.0 - 10.5 K/uL   RBC 4.93 4.22 - 5.81 MIL/uL   Hemoglobin 10.9 (L) 13.0 - 17.0 g/dL   HCT 36.4 (L) 39.0 - 52.0 %   MCV 73.8 (L) 80.0 - 100.0 fL   MCH 22.1 (L) 26.0 - 34.0 pg   MCHC 29.9 (L) 30.0 - 36.0 g/dL   RDW 13.6 11.5 - 15.5 %   Platelets 272 150 - 400 K/uL    nRBC 0.0 0.0 - 0.2 %   Neutrophils Relative % 77 %   Neutro Abs 7.7 1.7 - 7.7 K/uL   Lymphocytes Relative 14 %   Lymphs Abs 1.4 0.7 - 4.0 K/uL   Monocytes Relative 7 %   Monocytes Absolute 0.7 0.1 - 1.0 K/uL   Eosinophils Relative 1 %   Eosinophils Absolute 0.1 0.0 - 0.5 K/uL   Basophils Relative 0 %   Basophils Absolute 0.0 0.0 - 0.1 K/uL   Immature Granulocytes 1 %   Abs Immature Granulocytes 0.05 0.00 - 0.07 K/uL  Lactic acid, plasma     Status: None   Collection Time: 04/17/21  2:15 PM  Result Value Ref Range   Lactic Acid, Venous 0.9 0.5 - 1.9 mmol/L  Protime-INR     Status: None   Collection Time: 04/17/21  2:19 PM  Result Value Ref Range   Prothrombin Time 15.0 11.4 - 15.2 seconds   INR 1.2 0.8 - 1.2  Blood gas, venous     Status: Abnormal   Collection Time: 04/17/21  3:18 PM  Result Value Ref Range   pH, Ven 7.363 7.250 - 7.430   pCO2, Ven 53.0 44.0 - 60.0 mmHg   pO2, Ven <31.0 (LL) 32.0 - 45.0 mmHg   Bicarbonate 29.4 (H) 20.0 - 28.0 mmol/L   Acid-Base Excess 3.6 (H) 0.0 - 2.0 mmol/L   O2 Saturation 25.6 %   Patient temperature 98.6   Urinalysis, Routine w reflex microscopic Urine, Clean Catch     Status: Abnormal   Collection Time: 04/17/21  3:34 PM  Result Value Ref Range   Color, Urine YELLOW YELLOW   APPearance CLEAR CLEAR   Specific Gravity, Urine 1.012 1.005 - 1.030   pH 6.0 5.0 - 8.0   Glucose, UA NEGATIVE NEGATIVE mg/dL   Hgb  urine dipstick NEGATIVE NEGATIVE   Bilirubin Urine NEGATIVE NEGATIVE   Ketones, ur NEGATIVE NEGATIVE mg/dL   Protein, ur 30 (A) NEGATIVE mg/dL   Nitrite NEGATIVE NEGATIVE   Leukocytes,Ua NEGATIVE NEGATIVE   RBC / HPF 0-5 0 - 5 RBC/hpf   WBC, UA 0-5 0 - 5 WBC/hpf   Bacteria, UA NONE SEEN NONE SEEN   Squamous Epithelial / LPF 0-5 0 - 5   Mucus PRESENT   Troponin I (High Sensitivity)     Status: None   Collection Time: 04/17/21  5:30 PM  Result Value Ref Range   Troponin I (High Sensitivity) 4 <18 ng/L  POC occult blood, ED      Status: None   Collection Time: 04/17/21  6:08 PM  Result Value Ref Range   Fecal Occult Bld NEGATIVE NEGATIVE  SARS CORONAVIRUS 2 (TAT 6-24 HRS) Nasopharyngeal Nasopharyngeal Swab     Status: None   Collection Time: 04/17/21  9:19 PM   Specimen: Nasopharyngeal Swab  Result Value Ref Range   SARS Coronavirus 2 NEGATIVE NEGATIVE     Radiological Exams on Admission: CT Abdomen Pelvis Wo Contrast  Result Date: 04/17/2021 CLINICAL DATA:  Constipation for 1 month. History of lung cancer and prostate surgery. Decreased renal function. EXAM: CT ABDOMEN AND PELVIS WITHOUT CONTRAST TECHNIQUE: Multidetector CT imaging of the abdomen and pelvis was performed following the standard protocol without IV contrast. COMPARISON:  04/15/2021 FINDINGS: Lower chest: Small bilateral pleural effusions with basilar atelectasis. Coronary artery calcifications. Hepatobiliary: Sludge and stones in the gallbladder. No gallbladder wall thickening or stranding. No bile duct dilatation. No focal liver lesions. Pancreas: Unremarkable. No pancreatic ductal dilatation or surrounding inflammatory changes. Spleen: Normal in size without focal abnormality. Adrenals/Urinary Tract: Left adrenal gland nodule measuring 2.9 cm diameter. Density measurements of about 7 Hounsfield units consistent with benign adenoma. No change since prior study. Multiple bilateral renal cysts including a right upper pole cyst measuring 9.5 cm diameter. No hydronephrosis or hydroureter. No renal stones identified. Bladder is unremarkable. Stomach/Bowel: Stomach and proximal small bowel are contrast filled. Mildly dilated proximal small bowel in comparison to the distal bowel, possibly related to ileus although this may just be due to the presence of contrast material proximally and not distally. Stool fills the colon. No wall thickening or inflammatory changes are suggested. Appendix is not identified. Vascular/Lymphatic: Aortic atherosclerosis. No enlarged  abdominal or pelvic lymph nodes. Reproductive: Prostate gland is surgically absent. Surgical clips in the pelvis. No significant lymphadenopathy. Other: No free air or free fluid in the abdomen. Musculoskeletal: Asymmetric appearance of the left pelvis again demonstrated without change. Degenerative changes in the spine and hips. IMPRESSION: 1. Small bilateral pleural effusions with basilar atelectasis. 2. Sludge and stones in the gallbladder without evidence of cholecystitis. 3. Multiple bilateral renal cysts. 4. Left adrenal gland nodule consistent with benign adenoma. 5. Mildly dilated proximal small bowel in comparison to the distal bowel, possibly related to ileus or early obstruction although this may just be due to the presence of contrast material proximally and not distally. 6. Stool fills the colon consistent with history of constipation. 7. Asymmetric appearance of the left pelvis again demonstrated without change. Aortic Atherosclerosis (ICD10-I70.0). Electronically Signed   By: Lucienne Capers M.D.   On: 04/17/2021 20:05   CT Abdomen Pelvis Wo Contrast  Final Result      Consults called:  n/a   EKG: Independently reviewed. NSR, PACs   Dwyane Dee, MD [facility] 04/18/2021, 7:33 AM

## 2021-04-18 NOTE — Progress Notes (Signed)
PROGRESS NOTE    [name] [name]  ELT:[id number] DOB: 31-May-1933 DOA: 04/17/2021 PCP: [facility]   Brief Narrative: 87 year old with past medical history significant for dementia, hypertension, prostate cancer, CKD stage IV, nephrolithiasis presented to the ER complaining of generalized weakness and ongoing constipation. Patient wife has been having trouble caring for patient at home.  Evaluation in the ED CT abdomen and pelvis showed mildly dilated proximal small bowel compared to the distal bowel.  There was possible concern for ileus or early obstruction or possibly due to the presence of contrast material approximately and not distally.  Received enema in the ER and he had a large bowel movement.    Assessment & Plan:   Active Problems:   Constipation   History of DVT (deep vein thrombosis)   Dementia without behavioral disturbance (HCC)  1-constipation, suspicious for ileus or early SBO Continue with MiraLAX and senna Receive enema. Had bowel movement overnight x2.  2-physical deconditioning, dementia: Might need to consider long-term care placement. Continue  with Namenda Ativan and Seroquel  3-History of DVT: Continue with Eliquis  4-Hypertension: Continue with amlodipine  5-Acute metabolic encephalopathy: Patient more sleepy this morning, this could be related to delirium secondary to acute illness. Check B12 and TSH Will check CT head.   6-CKD stage IV; CR stable.  CR baseline 2   Estimated body mass index is 26.88 kg/m as calculated from the following:   Height as of this encounter: 6' (1.829 m).   Weight as of this encounter: 89.9 kg.   DVT prophylaxis: Eliquis Code Status: Full code Family Communication: care discussed with wife Disposition Plan:  Status is: Observation  The patient remains OBS appropriate and will d/c before 2 midnights.  Dispo: The patient is from: Home              Anticipated d/c is to: SNF              Patient currently  is not medically stable to d/c.   Difficult to place patient No        Consultants:   none  Procedures:   none  Antimicrobials:    Subjective: He is sleepy, keeps eyes close, said few words.    Objective: Vitals:   04/17/21 2330 04/18/21 0104 04/18/21 0105 04/18/21 0510  BP: (!) 172/103 (!) 135/100  (!) 158/95  Pulse: 73 69  65  Resp: 16 18  18   Temp:  99.2 F (37.3 C)  98.6 F (37 C)  TempSrc:  Oral  Oral  SpO2: 99% 100%  99%  Weight:   89.9 kg   Height:   6' (1.829 m)     Intake/Output Summary (Last 24 hours) at 04/18/2021 1121 Last data filed at 04/18/2021 0918 Gross per 24 hour  Intake 1813.31 ml  Output 1650 ml  Net 163.31 ml   Filed Weights   04/18/21 0105  Weight: 89.9 kg    Examination:  General exam: Appears calm and comfortable  Respiratory system: Clear to auscultation. Respiratory effort normal. Cardiovascular system: S1 & S2 heard, RRR.  Gastrointestinal system: Abdomen is nondistended, soft and nontender. No organomegaly or masses felt. Normal bowel sounds heard. Central nervous system: sleepy Extremities: Symmetric 5 x 5 power.   Data Reviewed: I have personally reviewed following labs and imaging studies  CBC: Recent Labs  Lab 04/14/21 2318 04/17/21 1300  WBC 9.9 9.9  NEUTROABS 7.4 7.7  HGB 10.0* 10.9*  HCT 33.6* 36.4*  MCV 73.8* 73.8*  PLT 234 884   Basic Metabolic Panel: Recent Labs  Lab 04/14/21 2318 04/17/21 1300  NA 137 144  K 4.6 4.5  CL 104 108  CO2 26 27  GLUCOSE 154* 156*  BUN 31* 23  CREATININE 2.14* 2.02*  CALCIUM 8.9 9.4   GFR: Estimated Creatinine Clearance: 28.8 mL/min (A) (by C-G formula based on SCr of 2.02 mg/dL (H)). Liver Function Tests: Recent Labs  Lab 04/17/21 1300  AST 15  ALT 9  ALKPHOS 90  BILITOT 0.4  PROT 7.7  ALBUMIN 3.5   Recent Labs  Lab 04/17/21 1300  LIPASE 45   No results for input(s): AMMONIA in the last 168 hours. Coagulation Profile: Recent Labs  Lab  04/17/21 1419  INR 1.2   Cardiac Enzymes: No results for input(s): CKTOTAL, CKMB, CKMBINDEX, TROPONINI in the last 168 hours. BNP (last 3 results) No results for input(s): PROBNP in the last 8760 hours. HbA1C: No results for input(s): HGBA1C in the last 72 hours. CBG: No results for input(s): GLUCAP in the last 168 hours. Lipid Profile: No results for input(s): CHOL, HDL, LDLCALC, TRIG, CHOLHDL, LDLDIRECT in the last 72 hours. Thyroid Function Tests: No results for input(s): TSH, T4TOTAL, FREET4, T3FREE, THYROIDAB in the last 72 hours. Anemia Panel: No results for input(s): VITAMINB12, FOLATE, FERRITIN, TIBC, IRON, RETICCTPCT in the last 72 hours. Sepsis Labs: Recent Labs  Lab 04/17/21 1415  LATICACIDVEN 0.9    Recent Results (from the past 240 hour(s))  SARS CORONAVIRUS 2 (TAT 6-24 HRS) Nasopharyngeal Nasopharyngeal Swab     Status: None   Collection Time: 04/17/21  9:19 PM   Specimen: Nasopharyngeal Swab  Result Value Ref Range Status   SARS Coronavirus 2 NEGATIVE NEGATIVE Final    Comment: (NOTE) SARS-CoV-2 target nucleic acids are NOT DETECTED.  The SARS-CoV-2 RNA is generally detectable in upper and lower respiratory specimens during the acute phase of infection. Negative results do not preclude SARS-CoV-2 infection, do not rule out co-infections with other pathogens, and should not be used as the sole basis for treatment or other patient management decisions. Negative results must be combined with clinical observations, patient history, and epidemiological information. The expected result is Negative.  Fact Sheet for Patients: SugarRoll.be  Fact Sheet for Healthcare Providers: https://www.woods-mathews.com/  This test is not yet approved or cleared by the [place] FDA and  has been authorized for detection and/or diagnosis of SARS-CoV-2 by FDA under an Emergency Use Authorization (EUA). This EUA will remain  in  effect (meaning this test can be used) for the duration of the COVID-19 declaration under Se ction 564(b)(1) of the Act, 21 U.S.C. section 360bbb-3(b)(1), unless the authorization is terminated or revoked sooner.  Performed at [facility], [place] [address], [place], [place] [address]          Radiology Studies: CT Abdomen Pelvis Wo Contrast  Result Date: 04/17/2021 CLINICAL DATA:  Constipation for 1 month. History of lung cancer and prostate surgery. Decreased renal function. EXAM: CT ABDOMEN AND PELVIS WITHOUT CONTRAST TECHNIQUE: Multidetector CT imaging of the abdomen and pelvis was performed following the standard protocol without IV contrast. COMPARISON:  04/15/2021 FINDINGS: Lower chest: Small bilateral pleural effusions with basilar atelectasis. Coronary artery calcifications. Hepatobiliary: Sludge and stones in the gallbladder. No gallbladder wall thickening or stranding. No bile duct dilatation. No focal liver lesions. Pancreas: Unremarkable. No pancreatic ductal dilatation or surrounding inflammatory changes. Spleen: Normal in size without focal abnormality. Adrenals/Urinary Tract: Left adrenal gland nodule measuring  2.9 cm diameter. Density measurements of about 7 Hounsfield units consistent with benign adenoma. No change since prior study. Multiple bilateral renal cysts including a right upper pole cyst measuring 9.5 cm diameter. No hydronephrosis or hydroureter. No renal stones identified. Bladder is unremarkable. Stomach/Bowel: Stomach and proximal small bowel are contrast filled. Mildly dilated proximal small bowel in comparison to the distal bowel, possibly related to ileus although this may just be due to the presence of contrast material proximally and not distally. Stool fills the colon. No wall thickening or inflammatory changes are suggested. Appendix is not identified. Vascular/Lymphatic: Aortic atherosclerosis. No enlarged abdominal or pelvic lymph nodes. Reproductive:  Prostate gland is surgically absent. Surgical clips in the pelvis. No significant lymphadenopathy. Other: No free air or free fluid in the abdomen. Musculoskeletal: Asymmetric appearance of the left pelvis again demonstrated without change. Degenerative changes in the spine and hips. IMPRESSION: 1. Small bilateral pleural effusions with basilar atelectasis. 2. Sludge and stones in the gallbladder without evidence of cholecystitis. 3. Multiple bilateral renal cysts. 4. Left adrenal gland nodule consistent with benign adenoma. 5. Mildly dilated proximal small bowel in comparison to the distal bowel, possibly related to ileus or early obstruction although this may just be due to the presence of contrast material proximally and not distally. 6. Stool fills the colon consistent with history of constipation. 7. Asymmetric appearance of the left pelvis again demonstrated without change. Aortic Atherosclerosis (ICD10-I70.0). Electronically Signed   By: Lucienne Capers M.D.   On: 04/17/2021 20:05        Scheduled Meds: . amLODipine  10 mg Oral Daily  . apixaban  5 mg Oral BID  . feeding supplement (GLUCERNA SHAKE)  237 mL Oral Daily  . memantine  10 mg Oral BID  . mirabegron ER  50 mg Oral Daily  . multivitamin with minerals  1 tablet Oral Daily  . polyethylene glycol  17 g Oral BID  . QUEtiapine  50 mg Oral QID  . senna-docusate  1 tablet Oral BID  . sodium chloride flush  3 mL Intravenous Q12H   Continuous Infusions: . lactated ringers 125 mL/hr at 04/18/21 1018     LOS: 0 days    Time spent: 35 minutes    Dannya Pitkin A Tatum Massman, MD [facility]   If 7PM-7AM, please contact night-coverage [email or web address]  04/18/2021, 11:21 AM

## 2021-04-19 DIAGNOSIS — K567 Ileus, unspecified: Secondary | ICD-10-CM | POA: Diagnosis present

## 2021-04-19 DIAGNOSIS — Z7901 Long term (current) use of anticoagulants: Secondary | ICD-10-CM | POA: Diagnosis not present

## 2021-04-19 DIAGNOSIS — G9341 Metabolic encephalopathy: Secondary | ICD-10-CM | POA: Diagnosis present

## 2021-04-19 DIAGNOSIS — N184 Chronic kidney disease, stage 4 (severe): Secondary | ICD-10-CM | POA: Diagnosis present

## 2021-04-19 DIAGNOSIS — R627 Adult failure to thrive: Secondary | ICD-10-CM | POA: Diagnosis present

## 2021-04-19 DIAGNOSIS — Z87891 Personal history of nicotine dependence: Secondary | ICD-10-CM | POA: Diagnosis not present

## 2021-04-19 DIAGNOSIS — Z86718 Personal history of other venous thrombosis and embolism: Secondary | ICD-10-CM | POA: Diagnosis not present

## 2021-04-19 DIAGNOSIS — I129 Hypertensive chronic kidney disease with stage 1 through stage 4 chronic kidney disease, or unspecified chronic kidney disease: Secondary | ICD-10-CM | POA: Diagnosis present

## 2021-04-19 DIAGNOSIS — F0280 Dementia in other diseases classified elsewhere without behavioral disturbance: Secondary | ICD-10-CM | POA: Diagnosis present

## 2021-04-19 DIAGNOSIS — Z6826 Body mass index (BMI) 26.0-26.9, adult: Secondary | ICD-10-CM | POA: Diagnosis not present

## 2021-04-19 DIAGNOSIS — Z8546 Personal history of malignant neoplasm of prostate: Secondary | ICD-10-CM | POA: Diagnosis not present

## 2021-04-19 DIAGNOSIS — Z79899 Other long term (current) drug therapy: Secondary | ICD-10-CM | POA: Diagnosis not present

## 2021-04-19 DIAGNOSIS — Z20822 Contact with and (suspected) exposure to covid-19: Secondary | ICD-10-CM | POA: Diagnosis present

## 2021-04-19 DIAGNOSIS — G309 Alzheimer's disease, unspecified: Secondary | ICD-10-CM | POA: Diagnosis present

## 2021-04-19 LAB — T4, FREE: Free T4: 0.82 ng/dL (ref 0.61–1.12)

## 2021-04-19 LAB — BASIC METABOLIC PANEL
Anion gap: 8 (ref 5–15)
BUN: 16 mg/dL (ref 8–23)
CO2: 26 mmol/L (ref 22–32)
Calcium: 9.3 mg/dL (ref 8.9–10.3)
Chloride: 103 mmol/L (ref 98–111)
Creatinine, Ser: 1.7 mg/dL — ABNORMAL HIGH (ref 0.61–1.24)
GFR, Estimated: 39 mL/min — ABNORMAL LOW (ref >60)
Glucose, Bld: 127 mg/dL — ABNORMAL HIGH (ref 70–99)
Potassium: 4.9 mmol/L (ref 3.5–5.1)
Sodium: 137 mmol/L (ref 135–145)

## 2021-04-19 MED ORDER — BISACODYL 10 MG RE SUPP
10.0000 mg | Freq: Once | RECTAL | Status: AC
  Administered 2021-04-19: 10 mg via RECTAL
  Filled 2021-04-19: qty 1

## 2021-04-19 MED ORDER — THIAMINE HCL 100 MG PO TABS
100.0000 mg | ORAL_TABLET | Freq: Every day | ORAL | Status: DC
  Administered 2021-04-19 – 2021-04-22 (×4): 100 mg via ORAL
  Filled 2021-04-19 (×4): qty 1

## 2021-04-19 MED ORDER — FOLIC ACID 1 MG PO TABS
1.0000 mg | ORAL_TABLET | Freq: Every day | ORAL | Status: DC
  Administered 2021-04-19 – 2021-04-22 (×4): 1 mg via ORAL
  Filled 2021-04-19 (×4): qty 1

## 2021-04-19 NOTE — Evaluation (Signed)
Occupational Therapy Evaluation Patient Details Name: [name] [name] MRN: [id number] DOB: 12-Dec-1933 Today's Date: 04/19/2021    History of Present Illness 87 year old male with PMH dementia, hypertension, prostate cancer, CKD 4, nephrolithiasis who presented to the ER with complaints of generalized weakness and ongoing constipation.   Clinical Impression   Patient is poor historian, unable to clearly state his own birthday perseverates on month only. Patient needing max cues to initiate/follow directions and does so inconsistently. Per chart review spouse having to provide more assistance with self care and having difficulty caring for patient at home. Unsure of patient's baseline mentation or assist levels prior to this decline, will need to clarify with family. Overall patient total A for bed mobility as patient becomes more rigid and almost resistive to mobility. Patient needing max cues and mod A for thoroughness to wash his face at edge of bed. Attempted standing with bed height elevated and assist x1 however patient does not attempt to power up to standing with feet sliding therefore deferred and notified RN will need lift for out of bed.   Will trial acute OT to see if able to maximize patient overall balance, safety/cognition in order to reduce caregiver burden with self care however if unable to progress will likely need 24/7 custodial care.    Follow Up Recommendations  SNF;Other (comment);Supervision/Assistance - 24 hour (likely for custodial care, will trial acute OT)    Equipment Recommendations  None recommended by OT       Precautions / Restrictions Precautions Precautions: Fall Restrictions Weight Bearing Restrictions: No      Mobility Bed Mobility Overal bed mobility: Needs Assistance Bed Mobility: Supine to Sit;Sit to Supine     Supine to sit: Total assist;HOB elevated Sit to supine: Total assist   General bed mobility comments: patient becomes almost resistive  and more rigid with any mobility, does not assist with bed mobility    Transfers                 General transfer comment: unable, please see toilet transfer in ADL section    Balance Overall balance assessment: Needs assistance Sitting-balance support: Feet supported Sitting balance-Leahy Scale: Fair Sitting balance - Comments: able to sit statically without UE support       Standing balance comment: unable                           ADL either performed or assessed with clinical judgement   ADL Overall ADL's : Needs assistance/impaired Eating/Feeding: Maximal assistance;Sitting;Bed level   Grooming: Wash/dry face;Sitting;Moderate assistance Grooming Details (indicate cue type and reason): patient needs max cues to reach for wash cloth and needs mod A for thoroughness to wash face Upper Body Bathing: Maximal assistance;Bed level;Sitting   Lower Body Bathing: Total assistance;Sitting/lateral leans;Bed level   Upper Body Dressing : Total assistance;Sitting;Bed level   Lower Body Dressing: Total assistance;Sitting/lateral leans;Bed level     Toilet Transfer Details (indicate cue type and reason): unable, attempt to have patient stand from edge of bed with bed height elevated and hand over hand assist on walker however patient does not provide any effort to try and power up to standing Toileting- Clothing Manipulation and Hygiene: Total assistance;Bed level Toileting - Clothing Manipulation Details (indicate cue type and reason): appears patient is incontinent, per chart review wearing a diaper at home     Functional mobility during ADLs: Total assistance General ADL Comments: unsure of patient's baseline/what he was  able to perform prior to recent decline. Per chart review spouse having to provide increased assist with ADLs at home and having difficulty caring for him     Vision   Additional Comments: difficult to assess due to cognition, patient having  trouble focusing on visual stimuli            Pertinent Vitals/Pain Pain Assessment: Faces Faces Pain Scale: No hurt     Hand Dominance  (does not specify)   Extremity/Trunk Assessment Upper Extremity Assessment Upper Extremity Assessment: Difficult to assess due to impaired cognition (patient UE functionally very strong however not following directions to try and assess ROM, also becomes more rigid almost resistive when trying to range or have patient reach with UEs)   Lower Extremity Assessment Lower Extremity Assessment: Defer to PT evaluation       Communication Communication Communication: HOH   Cognition Arousal/Alertness: Awake/alert Behavior During Therapy: Flat affect Overall Cognitive Status: No family/caregiver present to determine baseline cognitive functioning                                 General Comments: per chart history of dementia, unsure if patient is at his baseline however needing max cues to follow directions inconsistently              Home Living Family/patient expects to be discharged to:: Skilled nursing facility                                 Additional Comments: patient is a poor historian and family not present, unsure of home set up      Prior Functioning/Environment          Comments: per chart review spouse having to provide increased assistance with ADLs, unsure what patient was able to do before this decline        OT Problem List: Impaired balance (sitting and/or standing);Decreased activity tolerance;Decreased safety awareness;Decreased cognition      OT Treatment/Interventions: Self-care/ADL training;Therapeutic activities;Cognitive remediation/compensation;Patient/family education;Balance training    OT Goals(Current goals can be found in the care plan section) Acute Rehab OT Goals Patient Stated Goal: unable to state OT Goal Formulation: Patient unable to participate in goal setting Time  For Goal Achievement: 05/03/21 Potential to Achieve Goals: Fair  OT Frequency: Min 2X/week    AM-PAC OT "6 Clicks" Daily Activity     Outcome Measure Help from another person eating meals?: A Lot Help from another person taking care of personal grooming?: A Lot Help from another person toileting, which includes using toliet, bedpan, or urinal?: Total Help from another person bathing (including washing, rinsing, drying)?: A Lot Help from another person to put on and taking off regular upper body clothing?: A Lot Help from another person to put on and taking off regular lower body clothing?: Total 6 Click Score: 10   End of Session Nurse Communication: Mobility status;Need for lift equipment  Activity Tolerance: Other (comment) (Treatment limited secondary to cognition) Patient left: in bed;with call bell/phone within reach;with bed alarm set;with nursing/sitter in room  OT Visit Diagnosis: Other abnormalities of gait and mobility (R26.89);Other symptoms and signs involving cognitive function                Time: 5974-1638 OT Time Calculation (min): 19 min Charges:  OT General Charges $OT Visit: 1 Visit OT Evaluation $OT Eval Low  Complexity: [place] OT OT pager: [place] 04/19/2021, 2:58 PM

## 2021-04-19 NOTE — Progress Notes (Signed)
PROGRESS NOTE    [name] [name]  NIO:[id number] DOB: 1933/07/15 DOA: 04/17/2021 PCP: [facility]   Brief Narrative: 87 year old with past medical history significant for dementia, hypertension, prostate cancer, CKD stage IV, nephrolithiasis presented to the ER complaining of generalized weakness and ongoing constipation. Patient wife has been having trouble caring for patient at home.  Evaluation in the ED CT abdomen and pelvis showed mildly dilated proximal small bowel compared to the distal bowel.  There was possible concern for ileus or early obstruction or possibly due to the presence of contrast material approximately and not distally.  Received enema in the ER and he had a large bowel movement.    Assessment & Plan:   Active Problems:   Constipation   History of DVT (deep vein thrombosis)   Dementia without behavioral disturbance (HCC)   Ileus (HCC)  1-Constipation, suspicious for ileus or early SBO Continue with MiraLAX and senna Receive enema on admission.  Had bowel movement overnight x2.4/26. No BM today. Will give dulcolax suppository. Will advance diet today.   2-Physical Deconditioning, Dementia: Might need to consider long-term care placement. Continue  with Namenda Ativan and Seroquel Patient pocketing food, for lunch didn't want to swallow food. Plan to change diet to soft, will get speech swallow.   3-History of DVT: Continue with Eliquis  4-Hypertension: Continue with amlodipine  5-Acute metabolic encephalopathy: Patient more sleepy, lethargic  the morning 4/26, this could be related to delirium secondary to acute illness. B12: 748 and TSH ; 5.8 mildly elevated but Free T 4 normal 0.8, await Free T 3.  CT head; no acute intracranial abnormalities.   6-CKD stage IV; CR stable.  CR baseline 2   Estimated body mass index is 26.88 kg/m as calculated from the following:   Height as of this encounter: 6' (1.829 m).   Weight as of this encounter: 89.9  kg.   DVT prophylaxis: Eliquis Code Status: Full code Family Communication: care discussed with wife Disposition Plan:  Status is: Observation  The patient remains OBS appropriate and will d/c before 2 midnights.  Dispo: The patient is from: Home              Anticipated d/c is to: SNF              Patient currently is not medically stable to d/c.   Difficult to place patient No        Consultants:   none  Procedures:   none  Antimicrobials:    Subjective: Patient is awake today, eyes open, answering questions today.; yes and no.  No BM since yesterday.    Objective: Vitals:   04/19/21 0503 04/19/21 0954 04/19/21 1154 04/19/21 1437  BP: (!) 189/119 (!) 180/90 (!) 141/97 137/89  Pulse: 61 (!) 58 65 69  Resp: 20 20 16 16   Temp: 98 F (36.7 C)   98.3 F (36.8 C)  TempSrc:    Oral  SpO2: 96% 99% 98% 98%  Weight:      Height:        Intake/Output Summary (Last 24 hours) at 04/19/2021 1612 Last data filed at 04/19/2021 1100 Gross per 24 hour  Intake 553.97 ml  Output 2625 ml  Net -2071.03 ml   Filed Weights   04/18/21 0105  Weight: 89.9 kg    Examination:  General exam: NAD Respiratory system: CTA Cardiovascular system: S 1, S 2 RRR Gastrointestinal system: BS present, soft, nt Central nervous system: alert, answer simple  questions Extremities:  no edema   Data Reviewed: I have personally reviewed following labs and imaging studies  CBC: Recent Labs  Lab 04/14/21 2318 04/17/21 1300  WBC 9.9 9.9  NEUTROABS 7.4 7.7  HGB 10.0* 10.9*  HCT 33.6* 36.4*  MCV 73.8* 73.8*  PLT 234 409   Basic Metabolic Panel: Recent Labs  Lab 04/14/21 2318 04/17/21 1300 04/18/21 1138 04/19/21 0501  NA 137 144 137 137  K 4.6 4.5 4.4 4.9  CL 104 108 103 103  CO2 26 27 27 26   GLUCOSE 154* 156* 134* 127*  BUN 31* 23 17 16   CREATININE 2.14* 2.02* 1.64* 1.70*  CALCIUM 8.9 9.4 8.9 9.3   GFR: Estimated Creatinine Clearance: 34.2 mL/min (A) (by C-G  formula based on SCr of 1.7 mg/dL (H)). Liver Function Tests: Recent Labs  Lab 04/17/21 1300  AST 15  ALT 9  ALKPHOS 90  BILITOT 0.4  PROT 7.7  ALBUMIN 3.5   Recent Labs  Lab 04/17/21 1300  LIPASE 45   No results for input(s): AMMONIA in the last 168 hours. Coagulation Profile: Recent Labs  Lab 04/17/21 1419  INR 1.2   Cardiac Enzymes: No results for input(s): CKTOTAL, CKMB, CKMBINDEX, TROPONINI in the last 168 hours. BNP (last 3 results) No results for input(s): PROBNP in the last 8760 hours. HbA1C: No results for input(s): HGBA1C in the last 72 hours. CBG: Recent Labs  Lab 04/18/21 1222  GLUCAP 140*   Lipid Profile: No results for input(s): CHOL, HDL, LDLCALC, TRIG, CHOLHDL, LDLDIRECT in the last 72 hours. Thyroid Function Tests: Recent Labs    04/18/21 1138 04/19/21 0501  TSH 5.857*  --   FREET4  --  0.82   Anemia Panel: Recent Labs    04/18/21 1138  VITAMINB12 748   Sepsis Labs: Recent Labs  Lab 04/17/21 1415  LATICACIDVEN 0.9    Recent Results (from the past 240 hour(s))  SARS CORONAVIRUS 2 (TAT 6-24 HRS) Nasopharyngeal Nasopharyngeal Swab     Status: None   Collection Time: 04/17/21  9:19 PM   Specimen: Nasopharyngeal Swab  Result Value Ref Range Status   SARS Coronavirus 2 NEGATIVE NEGATIVE Final    Comment: (NOTE) SARS-CoV-2 target nucleic acids are NOT DETECTED.  The SARS-CoV-2 RNA is generally detectable in upper and lower respiratory specimens during the acute phase of infection. Negative results do not preclude SARS-CoV-2 infection, do not rule out co-infections with other pathogens, and should not be used as the sole basis for treatment or other patient management decisions. Negative results must be combined with clinical observations, patient history, and epidemiological information. The expected result is Negative.  Fact Sheet for Patients: SugarRoll.be  Fact Sheet for Healthcare  Providers: https://www.woods-mathews.com/  This test is not yet approved or cleared by the [place] FDA and  has been authorized for detection and/or diagnosis of SARS-CoV-2 by FDA under an Emergency Use Authorization (EUA). This EUA will remain  in effect (meaning this test can be used) for the duration of the COVID-19 declaration under Se ction 564(b)(1) of the Act, 21 U.S.C. section 360bbb-3(b)(1), unless the authorization is terminated or revoked sooner.  Performed at [facility], [place] [address] [place], [place] [address]          Radiology Studies: CT Abdomen Pelvis Wo Contrast  Result Date: 04/17/2021 CLINICAL DATA:  Constipation for 1 month. History of lung cancer and prostate surgery. Decreased renal function. EXAM: CT ABDOMEN AND PELVIS WITHOUT CONTRAST TECHNIQUE: Multidetector CT imaging of the  abdomen and pelvis was performed following the standard protocol without IV contrast. COMPARISON:  04/15/2021 FINDINGS: Lower chest: Small bilateral pleural effusions with basilar atelectasis. Coronary artery calcifications. Hepatobiliary: Sludge and stones in the gallbladder. No gallbladder wall thickening or stranding. No bile duct dilatation. No focal liver lesions. Pancreas: Unremarkable. No pancreatic ductal dilatation or surrounding inflammatory changes. Spleen: Normal in size without focal abnormality. Adrenals/Urinary Tract: Left adrenal gland nodule measuring 2.9 cm diameter. Density measurements of about 7 Hounsfield units consistent with benign adenoma. No change since prior study. Multiple bilateral renal cysts including a right upper pole cyst measuring 9.5 cm diameter. No hydronephrosis or hydroureter. No renal stones identified. Bladder is unremarkable. Stomach/Bowel: Stomach and proximal small bowel are contrast filled. Mildly dilated proximal small bowel in comparison to the distal bowel, possibly related to ileus although this may just be due to the  presence of contrast material proximally and not distally. Stool fills the colon. No wall thickening or inflammatory changes are suggested. Appendix is not identified. Vascular/Lymphatic: Aortic atherosclerosis. No enlarged abdominal or pelvic lymph nodes. Reproductive: Prostate gland is surgically absent. Surgical clips in the pelvis. No significant lymphadenopathy. Other: No free air or free fluid in the abdomen. Musculoskeletal: Asymmetric appearance of the left pelvis again demonstrated without change. Degenerative changes in the spine and hips. IMPRESSION: 1. Small bilateral pleural effusions with basilar atelectasis. 2. Sludge and stones in the gallbladder without evidence of cholecystitis. 3. Multiple bilateral renal cysts. 4. Left adrenal gland nodule consistent with benign adenoma. 5. Mildly dilated proximal small bowel in comparison to the distal bowel, possibly related to ileus or early obstruction although this may just be due to the presence of contrast material proximally and not distally. 6. Stool fills the colon consistent with history of constipation. 7. Asymmetric appearance of the left pelvis again demonstrated without change. Aortic Atherosclerosis (ICD10-I70.0). Electronically Signed   By: Lucienne Capers M.D.   On: 04/17/2021 20:05   CT HEAD WO CONTRAST  Result Date: 04/18/2021 CLINICAL DATA:  Delirium. EXAM: CT HEAD WITHOUT CONTRAST TECHNIQUE: Contiguous axial images were obtained from the base of the skull through the vertex without intravenous contrast. COMPARISON:  Prior head CT examinations 03/25/2016 and earlier. FINDINGS: Brain: Moderate to moderately advanced cerebral atrophy. Commensurate prominence of the ventricles and sulci. Comparatively mild cerebellar atrophy. Advanced patchy and ill-defined hypoattenuation within the cerebral white matter, nonspecific but compatible with chronic small vessel ischemic disease. There is no acute intracranial hemorrhage. No demarcated cortical  infarct. No extra-axial fluid collection. No evidence of intracranial mass. No midline shift. Vascular: No hyperdense vessel.  Atherosclerotic calcifications Skull: Normal. Negative for fracture or focal suspicious osseous lesion. Sinuses/Orbits: Visualized orbits show no acute finding. Trace bilateral ethmoid and maxillary sinus mucosal thickening. Small right maxillary sinus mucous retention cyst. IMPRESSION: No evidence of acute intracranial abnormality. Advanced cerebral white matter chronic small vessel ischemic disease, progressed from the prior head CT of 03/25/2016. Moderate to moderately advanced cerebral atrophy, also progressed. Comparatively mild cerebellar atrophy. Electronically Signed   By: Kellie Simmering DO   On: 04/18/2021 14:43        Scheduled Meds: . amLODipine  10 mg Oral Daily  . apixaban  5 mg Oral BID  . feeding supplement (GLUCERNA SHAKE)  237 mL Oral Daily  . folic acid  1 mg Oral Daily  . memantine  10 mg Oral BID  . mirabegron ER  50 mg Oral Daily  . multivitamin with minerals  1 tablet Oral Daily  .  polyethylene glycol  17 g Oral BID  . QUEtiapine  50 mg Oral QID  . senna-docusate  1 tablet Oral BID  . sodium chloride flush  3 mL Intravenous Q12H  . thiamine  100 mg Oral Daily   Continuous Infusions:    LOS: 0 days    Time spent: 35 minutes    Sheyna Pettibone A Chidubem Chaires, MD [facility]   If 7PM-7AM, please contact night-coverage [email or web address]  04/19/2021, 4:12 PM

## 2021-04-19 NOTE — Evaluation (Signed)
Physical Therapy Evaluation Patient Details Name: [name] [name] MRN: [id number] DOB: 07/01/1933 Today's Date: 04/19/2021   History of Present Illness  87 year old male with PMH dementia, hypertension, prostate cancer, CKD 4, nephrolithiasis who presented to the ER with complaints of generalized weakness and ongoing constipation.  Clinical Impression  Pt admitted with above diagnosis. Pt cooperative and pleasant with PT. Able to sit EOB, stand and take lateral steps with overall min-mod assist, second person for safety; pt with delayed motor planning, requires repetition and multi-modal cues for initiation/participation. Recommend SNF post acute. Will follow   Pt currently with functional limitations due to the deficits listed below (see PT Problem List). Pt will benefit from skilled PT to increase their independence and safety with mobility to allow discharge to the venue listed below.       Follow Up Recommendations SNF    Equipment Recommendations  None recommended by PT    Recommendations for Other Services       Precautions / Restrictions Precautions Precautions: Fall Restrictions Weight Bearing Restrictions: No      Mobility  Bed Mobility Overal bed mobility: Needs Assistance Bed Mobility: Supine to Sit;Sit to Supine     Supine to sit: Mod assist;HOB elevated Sit to supine: Mod assist;+2 for physical assistance;+2 for safety/equipment   General bed mobility comments: guided pt to s/l with hand over hand, able to come to sit with min assist with multi-modal cues; requires tactile cues to return to supine, assist to control descent and position trunk and lift LEs on to bed    Transfers Overall transfer level: Needs assistance Equipment used: Rolling walker (2 wheeled) Transfers: Sit to/from Stand Sit to Stand: Min assist;+2 safety/equipment         General transfer comment: difficulty with motor planning; mutli-modal cues for pt to stand, light assist to rise once  pt initiated movement  Ambulation/Gait             General Gait Details: lateral steps along EOB with RW and min assist of 2  Stairs            Wheelchair Mobility    Modified Rankin (Stroke Patients Only)       Balance Overall balance assessment: Needs assistance Sitting-balance support: Feet supported;Single extremity supported;No upper extremity supported Sitting balance-Leahy Scale: Fair Sitting balance - Comments: able to sit statically without UE support     Standing balance-Leahy Scale: Poor Standing balance comment: reliant on UEs                             Pertinent Vitals/Pain Pain Assessment: No/denies pain Faces Pain Scale: No hurt    Home Living Family/patient expects to be discharged to:: Skilled nursing facility Living Arrangements: Spouse/significant other               Additional Comments: patient is a poor historian and family not present, unsure of home set up    Prior Function Level of Independence: Needs assistance   Gait / Transfers Assistance Needed: per spouse pt ws able to sit EOB and transfer to chair, slept a fair amount; varying levels of assist/cooperation at home  ADL's / Homemaking Assistance Needed: assist from aide  Comments: pt requiring incr assist recently per spouse;  has aide 3 hrs/day--mostly assist with ADLs     Hand Dominance   Dominant Hand:  (does not specify)    Extremity/Trunk Assessment   Upper Extremity Assessment Upper Extremity Assessment:  Defer to OT evaluation;Overall [facility] for tasks assessed    Lower Extremity Assessment Lower Extremity Assessment: Generalized weakness       Communication   Communication: HOH  Cognition Arousal/Alertness: Awake/alert Behavior During Therapy: WFL for tasks assessed/performed Overall Cognitive Status: History of cognitive impairments - at baseline Area of Impairment: Following commands;Problem solving                        Following Commands: Follows one step commands inconsistently     Problem Solving: Slow processing;Decreased initiation;Difficulty sequencing;Requires verbal cues;Requires tactile cues General Comments: requires repetition, multi-modal cues, hand over hand  to follow one step commands      General Comments      Exercises     Assessment/Plan    PT Assessment Patient needs continued PT services  PT Problem List Decreased strength;Decreased mobility;Decreased activity tolerance;Decreased balance;Decreased cognition;Decreased knowledge of use of DME;Decreased safety awareness       PT Treatment Interventions DME instruction;Therapeutic exercise;Gait training;Functional mobility training;Therapeutic activities;Patient/family education;Balance training    PT Goals (Current goals can be found in the Care Plan section)  Acute Rehab PT Goals Patient Stated Goal: unable to state; wife states to go to  rehab/SNF PT Goal Formulation: With family Time For Goal Achievement: 05/02/21 Potential to Achieve Goals: Good    Frequency Min 2X/week   Barriers to discharge        Co-evaluation               AM-PAC PT "6 Clicks" Mobility  Outcome Measure Help needed turning from your back to your side while in a flat bed without using bedrails?: A Little Help needed moving from lying on your back to sitting on the side of a flat bed without using bedrails?: A Lot Help needed moving to and from a bed to a chair (including a wheelchair)?: Total Help needed standing up from a chair using your arms (e.g., wheelchair or bedside chair)?: Total Help needed to walk in hospital room?: Total Help needed climbing 3-5 steps with a railing? : Total 6 Click Score: 9    End of Session   Activity Tolerance: Patient tolerated treatment well Patient left: with call bell/phone within reach;in bed;with family/visitor present;with chair alarm set   PT Visit Diagnosis: Other abnormalities of gait and  mobility (R26.89)    Time: 5732-2025 PT Time Calculation (min) (ACUTE ONLY): 23 min   Charges:   PT Evaluation $PT Eval Low Complexity: 1 Low PT Treatments $Therapeutic Activity: 8-22 mins        Baxter Flattery, PT  Acute Rehab Dept ([place]) [phone] Pager [phone]  04/19/2021   [facility] 04/19/2021, 5:51 PM

## 2021-04-19 NOTE — TOC Initial Note (Signed)
Transition of Care [facility]) - Initial/Assessment Note    Patient Details  Name: [name] [name] MRN: [id number] Date of Birth: 05-16-1933  Transition of Care [facility]) CM/SW Contact:    Trish Mage, LCSW Phone Number: 04/19/2021, 2:21 PM  Clinical Narrative:  Patient seen in follow up to question by MD about need for LTC.  Mr Cookson, who is diagnosed with Alzheminer's, was asleep in bed, his wife was bedside visiting.  She confirmed that she has been getting three hours of aide services in the home daily, is there alone the rest of the time, has no family help, and is no longer able to care for him given the progression of his illness.  She shared phone numbers for members of his [place] home bound team, one of which was CSW Ms Bonds at [phone].  She stated that patient is not eligible for LTC through the [place], and that Ms Deardorff was told by previous CSW that she would need to apply for MCD for payor source.  She did offer respite bed, for which patient is eligible for 30 day stay, but up to this point it has not worked out as Ms Divis wanted him to stay locally at [facility], which has not had any openings.  Will follow up with Ms Birky, asking her to apply for MCD if she has not already, and encourage her to be OK with broadening search for respite bed so she can spend all her energies on getting him set up for LTC. Will send updated FL2 to Ms Bonds per request to help with respite bed search.  TOC will continue to follow during the course of hospitalization.                  Expected Discharge Plan: [place] Barriers to Discharge: No Barriers Identified   Patient Goals and CMS Choice        Expected Discharge Plan and Services Expected Discharge Plan: [place]   Discharge Planning Services: CM Consult Post Acute Care Choice: Resumption of Svcs/PTA Provider Living arrangements for the past 2 months: Single Family Home                                       Prior Living Arrangements/Services Living arrangements for the past 2 months: Single Family Home Lives with:: Spouse Patient language and need for interpreter reviewed:: Yes        Need for Family Participation in Patient Care: Yes (Comment) Care giver support system in place?: Yes (comment) Current home services: Homehealth aide Criminal Activity/Legal Involvement Pertinent to Current Situation/Hospitalization: No - Comment as needed  Activities of Daily Living Home Assistive Devices/Equipment: Walker (specify type),Cane (specify quad or straight) ADL Screening (condition at time of admission) Patient's cognitive ability adequate to safely complete daily activities?: No Is the patient deaf or have difficulty hearing?: No Does the patient have difficulty seeing, even when wearing glasses/contacts?: Yes Does the patient have difficulty concentrating, remembering, or making decisions?: Yes Patient able to express need for assistance with ADLs?: No Does the patient have difficulty dressing or bathing?: Yes Independently performs ADLs?: No Communication: Needs assistance Is this a change from baseline?: Pre-admission baseline Dressing (OT): Needs assistance Is this a change from baseline?: Pre-admission baseline Grooming: Needs assistance Is this a change from baseline?: Pre-admission baseline Feeding: Needs assistance Is this a change  from baseline?: Pre-admission baseline Bathing: Needs assistance Is this a change from baseline?: Pre-admission baseline Toileting: Needs assistance Is this a change from baseline?: Pre-admission baseline In/Out Bed: Needs assistance Is this a change from baseline?: Pre-admission baseline Walks in Home: Needs assistance Is this a change from baseline?: Pre-admission baseline Does the patient have difficulty walking or climbing stairs?: Yes Weakness of Legs: Both Weakness of Arms/Hands: Both  Permission Sought/Granted Permission sought to  share information with : Family Supports Permission granted to share information with : No  Share Information with NAME: Hsiung,Ernestine (Spouse)   [phone]           Emotional Assessment Appearance:: Appears stated age Attitude/Demeanor/Rapport: Unable to Assess Affect (typically observed): Unable to Assess Orientation: : Oriented to Self Alcohol / Substance Use: Not Applicable Psych Involvement: No (comment)  Admission diagnosis:  Ileus (HCC) [K56.7] Failure to thrive in adult [R62.7] Constipation [K59.00] Uncooperative behavior [R46.89] Constipation, unspecified constipation type [K59.00] Alzheimer's dementia without behavioral disturbance, unspecified timing of dementia onset ([place]) [G30.9, F02.80] Patient Active Problem List   Diagnosis Date Noted  . History of DVT (deep vein thrombosis) 04/18/2021  . Dementia without behavioral disturbance ([place]) 04/18/2021  . Ileus ([place])   . Constipation 04/17/2021  . Palliative care by specialist   . DVT (deep venous thrombosis) ([place]) 12/27/2020  . Acute kidney injury superimposed on chronic kidney disease ([place]) 03/26/2016  . Syncope and collapse 11/06/2015  . Hypertension 11/06/2015  . Carotid sinus syncope or syndrome 11/06/2015  . Acute renal failure syndrome ([place])   . CKD (chronic kidney disease) stage 4, GFR 15-29 ml/min (HCC) 03/27/2015  . Anemia of chronic disease 03/27/2015  . Syncope 12/02/2011  . HTN (hypertension) 12/02/2011   PCP:  [place]:   [place], [place] - [address] [place] [address] Phone: [phone] Fax: [phone]     Social Determinants of Health (SDOH) Interventions    Readmission Risk Interventions No flowsheet data found.

## 2021-04-19 NOTE — Plan of Care (Signed)
  Problem: Nutrition: Goal: Adequate nutrition will be maintained Outcome: Progressing   Problem: Pain Managment: Goal: General experience of comfort will improve Outcome: Progressing   Problem: Elimination: Goal: Will not experience complications related to bowel motility Outcome: Progressing   

## 2021-04-19 NOTE — NC FL2 (Signed)
[place] LEVEL OF CARE SCREENING TOOL     IDENTIFICATION  Patient Name: [name] [name] Birthdate: September 07, 1933 Sex: male Admission Date (Current Location): 04/17/2021  [facility] and [place] Number:  Herbalist and Address:  [facility],  [place] [address] [place]      Provider Number: [phone]  Attending Physician Name and Address:  Elmarie Shiley, MD  Relative Name and Phone Number:  Daxon, Kyne (Spouse)   [phone]    Current Level of Care: Hospital Recommended Level of Care: Memory Care,Other (Comment) (respite care) Prior Approval Number:    Date Approved/Denied:   PASRR Number:    Discharge Plan: SNF    Current Diagnoses: Patient Active Problem List   Diagnosis Date Noted  . History of DVT (deep vein thrombosis) 04/18/2021  . Dementia without behavioral disturbance ([place]) 04/18/2021  . Ileus ([place])   . Constipation 04/17/2021  . Palliative care by specialist   . DVT (deep venous thrombosis) ([place]) 12/27/2020  . Acute kidney injury superimposed on chronic kidney disease ([place]) 03/26/2016  . Syncope and collapse 11/06/2015  . Hypertension 11/06/2015  . Carotid sinus syncope or syndrome 11/06/2015  . Acute renal failure syndrome ([place])   . CKD (chronic kidney disease) stage 4, GFR 15-29 ml/min (HCC) 03/27/2015  . Anemia of chronic disease 03/27/2015  . Syncope 12/02/2011  . HTN (hypertension) 12/02/2011    Orientation RESPIRATION BLADDER Height & Weight     Self  Normal External catheter Weight: 89.9 kg Height:  6' (182.9 cm)  BEHAVIORAL SYMPTOMS/MOOD NEUROLOGICAL BOWEL NUTRITION STATUS   (none)  (none) Incontinent Diet (mechanical soft)  AMBULATORY STATUS COMMUNICATION OF NEEDS Skin   Extensive Assist Verbally Normal                       Personal Care Assistance Level of Assistance  Bathing,Feeding,Dressing Bathing Assistance: Maximum assistance Feeding assistance: Maximum assistance Dressing  Assistance: Maximum assistance     Functional Limitations Info  Sight,Hearing,Speech Sight Info: Adequate Hearing Info: Adequate Speech Info: Adequate    SPECIAL CARE FACTORS FREQUENCY                       Contractures Contractures Info: Not present    Additional Factors Info  Code Status,Allergies Code Status Info: Full Allergies Info: NKA           Current Medications (04/19/2021):  This is the current hospital active medication list Current Facility-Administered Medications  Medication Dose Route Frequency Provider Last Rate Last Admin  . amLODipine (NORVASC) tablet 10 mg  10 mg Oral Daily Dwyane Dee, MD   10 mg at 04/19/21 0955  . apixaban (ELIQUIS) tablet 5 mg  5 mg Oral BID Dwyane Dee, MD   5 mg at 04/19/21 0914  . feeding supplement (GLUCERNA SHAKE) (GLUCERNA SHAKE) liquid 237 mL  237 mL Oral Daily Dwyane Dee, MD   237 mL at 04/19/21 1006  . folic acid (FOLVITE) tablet 1 mg  1 mg Oral Daily [name] T, RPH   1 mg at 04/19/21 0956  . LORazepam (ATIVAN) tablet 0.5 mg  0.5 mg Oral Q8H PRN Dwyane Dee, MD      . memantine [facility]) tablet 10 mg  10 mg Oral BID Dwyane Dee, MD   10 mg at 04/19/21 0913  . mirabegron ER (MYRBETRIQ) tablet 50 mg  50 mg Oral Daily Dwyane Dee, MD   50 mg at 04/19/21 0912  .  multivitamin with minerals tablet 1 tablet  1 tablet Oral Daily Dwyane Dee, MD   1 tablet at 04/19/21 0913  . polyethylene glycol (MIRALAX / GLYCOLAX) packet 17 g  17 g Oral BID Dwyane Dee, MD   17 g at 04/19/21 0914  . QUEtiapine (SEROQUEL) tablet 50 mg  50 mg Oral QID Dwyane Dee, MD   50 mg at 04/19/21 0914  . senna-docusate (Senokot-S) tablet 1 tablet  1 tablet Oral BID Dwyane Dee, MD   1 tablet at 04/19/21 0914  . sodium chloride flush (NS) 0.9 % injection 3 mL  3 mL Intravenous Eddie Candle, MD   3 mL at 04/18/21 2128  . thiamine tablet 100 mg  100 mg Oral Daily Eudelia Bunch, RPH   100 mg at 04/19/21 9767      Discharge Medications: Please see discharge summary for a list of discharge medications.  Relevant Imaging Results:  Relevant Lab Results:   Additional Information ssn:[id number]  Trish Mage, LCSW

## 2021-04-20 LAB — T3, FREE: T3, Free: 2.5 pg/mL (ref 2.0–4.4)

## 2021-04-20 MED ORDER — ENSURE ENLIVE PO LIQD
237.0000 mL | Freq: Two times a day (BID) | ORAL | Status: DC
  Administered 2021-04-21 (×2): 237 mL via ORAL

## 2021-04-20 MED ORDER — LIP MEDEX EX OINT
TOPICAL_OINTMENT | CUTANEOUS | Status: DC | PRN
  Filled 2021-04-20: qty 7

## 2021-04-20 MED ORDER — HYDRALAZINE HCL 10 MG PO TABS
10.0000 mg | ORAL_TABLET | Freq: Two times a day (BID) | ORAL | Status: DC
  Administered 2021-04-20 – 2021-04-22 (×5): 10 mg via ORAL
  Filled 2021-04-20 (×5): qty 1

## 2021-04-20 NOTE — Progress Notes (Signed)
Initial Nutrition Assessment  DOCUMENTATION CODES:  Not applicable  INTERVENTION:  Reweigh patient as soon as possible.  Continue diet order per SLP.  Add Ensure Enlive po BID, each supplement provides 350 kcal and 20 grams of protein.  Add Magic cup TID with meals, each supplement provides 290 kcal and 9 grams of protein.  Continue MVI with minerals daily.  NUTRITION DIAGNOSIS:  Increased nutrient needs related to chronic illness as evidenced by estimated needs.  GOAL:  Patient will meet greater than or equal to 90% of their needs  MONITOR:  PO intake,Supplement acceptance,Labs,Weight trends,I & O's  REASON FOR ASSESSMENT:  Malnutrition Screening Tool    ASSESSMENT:  87 yo male with a PMH of dementia, hypertension, prostate cancer, CKD stage IV, and nephrolithiasis who presents with constipation, acute metabolic encephalopathy, and physical deconditioning 2/2 dementia.  4/28 - SLP recommends Dys 2 with thins  Spoke with wife at bedside while pt watched TV. Wife reports that pt has a good appetite and finishes all his meals with encouragement. She has not noticed any changes in his eating. She also reports giving him Glucerna twice per day.  She reports that she may have seen a 10 lb weight loss, but per Epic pt has lost 27 lbs (12% BW) in 20 days. This is questionable and likely inaccurate. Recommend reweighing pt as soon as possible. To determine needs, will use IBW.  On exam, pt has some mild depletions, likely from age - at risk for malnutrition, but none at this time.  Recommend discontinuing Glucerna TID and adding Ensure BID to promote caloric and protein intake. Also recommend adding Magic Cup TID.  Medications: folic acid, MVI with minerals, miralax BID, Senokot BID, thiamine 100 mg Labs: reviewed; CBG 140-297 HbA1c: 5.8% (12/2020)  NUTRITION - FOCUSED PHYSICAL EXAM: Flowsheet Row Most Recent Value  Orbital Region Mild depletion  Upper Arm Region Mild depletion   Thoracic and Lumbar Region No depletion  Buccal Region No depletion  Temple Region Mild depletion  Clavicle Bone Region Mild depletion  Clavicle and Acromion Bone Region No depletion  Scapular Bone Region No depletion  Dorsal Hand Mild depletion  Patellar Region Mild depletion  Anterior Thigh Region Mild depletion  Posterior Calf Region Mild depletion  Edema (RD Assessment) None  Hair Reviewed  Eyes Reviewed  Mouth Reviewed  Skin Reviewed  Nails Reviewed     Diet Order:   Diet Order            DIET DYS 2 Room service appropriate? No; Fluid consistency: Thin  Diet effective now                EDUCATION NEEDS:  Education needs have been addressed  Skin:  Skin Assessment: Reviewed RN Assessment (Dry, ecchymosis)  Last BM:  04/19/21 - Type 5, large amount  Height:  Ht Readings from Last 1 Encounters:  04/18/21 6' (1.829 m)   Weight:  Wt Readings from Last 1 Encounters:  04/18/21 89.9 kg   Ideal Body Weight:  81 kg  BMI:  Body mass index is 26.88 kg/m.  Estimated Nutritional Needs: used IBW Kcal:  1850-2050 Protein:  90-105 grams Fluid:  >1.65 L  Derrel Nip, RD, LDN Registered Dietitian After Hours/Weekend Pager # in [place]

## 2021-04-20 NOTE — Progress Notes (Signed)
PROGRESS NOTE    [name] [name]  UDJ:[id number] DOB: 07/08/1933 DOA: 04/17/2021 PCP: [facility]   Brief Narrative: 87 year old with past medical history significant for dementia, hypertension, prostate cancer, CKD stage IV, nephrolithiasis presented to the ER complaining of generalized weakness and ongoing constipation. Patient wife has been having trouble caring for patient at home.  Evaluation in the ED CT abdomen and pelvis showed mildly dilated proximal small bowel compared to the distal bowel.  There was possible concern for ileus or early obstruction or possibly due to the presence of contrast material approximately and not distally.  Received enema in the ER and he had a large bowel movement.    Assessment & Plan:   Active Problems:   Constipation   History of DVT (deep vein thrombosis)   Dementia without behavioral disturbance (HCC)   Ileus (HCC)  1-Constipation, suspicious for ileus or early SBO: Continue with MiraLAX and senna Receive enema on admission.  Had bowel movement overnight x2.4/26. BM 4/27 Tolerating dysphagia diet better.   2-Physical Deconditioning, Dementia: Might need to consider long-term care placement. Continue  with Namenda Ativan and Seroquel Patient pocketing food, for lunch didn't want to swallow food. He is doing better with Dysphagia 2 diet.   3-History of DVT: Continue with Eliquis  4-Hypertension: Continue with amlodipine  5-Acute metabolic encephalopathy: Patient more sleepy, lethargic  the morning 4/26, this could be related to delirium secondary to acute illness. B12: 748 and TSH ; 5.8 mildly elevated but Free T 4 normal 0.8, await Free T 3.  CT head; no acute intracranial abnormalities.   6-CKD stage IV; CR stable.  CR baseline 2   Estimated body mass index is 26.88 kg/m as calculated from the following:   Height as of this encounter: 6' (1.829 m).   Weight as of this encounter: 89.9 kg.   DVT prophylaxis: Eliquis Code  Status: Full code Family Communication: care discussed with wife 4/27 Disposition Plan:  Status is: Observation  The patient remains OBS appropriate and will d/c before 2 midnights.  Dispo: The patient is from: Home              Anticipated d/c is to: SNF              Patient currently is medically stable to d/c. awaiting SNF   Difficult to place patient No        Consultants:   none  Procedures:   none  Antimicrobials:    Subjective: He is alert, answer yes and no.  He denies pain. Per nurse report he had BM.    Objective: Vitals:   04/19/21 1437 04/19/21 2043 04/20/21 0513 04/20/21 0559  BP: 137/89 (!) 152/87 (!) 201/106 (!) 154/96  Pulse: 69 73 63 64  Resp: 16 18 20 20   Temp: 98.3 F (36.8 C) 98 F (36.7 C) 97.8 F (36.6 C) 97.8 F (36.6 C)  TempSrc: Oral     SpO2: 98% 99%    Weight:      Height:        Intake/Output Summary (Last 24 hours) at 04/20/2021 1357 Last data filed at 04/20/2021 1339 Gross per 24 hour  Intake 715 ml  Output 1550 ml  Net -835 ml   Filed Weights   04/18/21 0105  Weight: 89.9 kg    Examination:  General exam: NAD Respiratory system: CTA Cardiovascular system: S 1, S 2 RRR Gastrointestinal system: BS present,, soft, nt Central nervous system: Alert.  Extremities: No edema  Data Reviewed: I have personally reviewed following labs and imaging studies  CBC: Recent Labs  Lab 04/14/21 2318 04/17/21 1300  WBC 9.9 9.9  NEUTROABS 7.4 7.7  HGB 10.0* 10.9*  HCT 33.6* 36.4*  MCV 73.8* 73.8*  PLT 234 119   Basic Metabolic Panel: Recent Labs  Lab 04/14/21 2318 04/17/21 1300 04/18/21 1138 04/19/21 0501  NA 137 144 137 137  K 4.6 4.5 4.4 4.9  CL 104 108 103 103  CO2 26 27 27 26   GLUCOSE 154* 156* 134* 127*  BUN 31* 23 17 16   CREATININE 2.14* 2.02* 1.64* 1.70*  CALCIUM 8.9 9.4 8.9 9.3   GFR: Estimated Creatinine Clearance: 34.2 mL/min (A) (by C-G formula based on SCr of 1.7 mg/dL (H)). Liver Function  Tests: Recent Labs  Lab 04/17/21 1300  AST 15  ALT 9  ALKPHOS 90  BILITOT 0.4  PROT 7.7  ALBUMIN 3.5   Recent Labs  Lab 04/17/21 1300  LIPASE 45   No results for input(s): AMMONIA in the last 168 hours. Coagulation Profile: Recent Labs  Lab 04/17/21 1419  INR 1.2   Cardiac Enzymes: No results for input(s): CKTOTAL, CKMB, CKMBINDEX, TROPONINI in the last 168 hours. BNP (last 3 results) No results for input(s): PROBNP in the last 8760 hours. HbA1C: No results for input(s): HGBA1C in the last 72 hours. CBG: Recent Labs  Lab 04/18/21 1222  GLUCAP 140*   Lipid Profile: No results for input(s): CHOL, HDL, LDLCALC, TRIG, CHOLHDL, LDLDIRECT in the last 72 hours. Thyroid Function Tests: Recent Labs    04/18/21 1138 04/19/21 0501  TSH 5.857*  --   FREET4  --  0.82  T3FREE  --  2.5   Anemia Panel: Recent Labs    04/18/21 1138  VITAMINB12 748   Sepsis Labs: Recent Labs  Lab 04/17/21 1415  LATICACIDVEN 0.9    Recent Results (from the past 240 hour(s))  SARS CORONAVIRUS 2 (TAT 6-24 HRS) Nasopharyngeal Nasopharyngeal Swab     Status: None   Collection Time: 04/17/21  9:19 PM   Specimen: Nasopharyngeal Swab  Result Value Ref Range Status   SARS Coronavirus 2 NEGATIVE NEGATIVE Final    Comment: (NOTE) SARS-CoV-2 target nucleic acids are NOT DETECTED.  The SARS-CoV-2 RNA is generally detectable in upper and lower respiratory specimens during the acute phase of infection. Negative results do not preclude SARS-CoV-2 infection, do not rule out co-infections with other pathogens, and should not be used as the sole basis for treatment or other patient management decisions. Negative results must be combined with clinical observations, patient history, and epidemiological information. The expected result is Negative.  Fact Sheet for Patients: SugarRoll.be  Fact Sheet for Healthcare  Providers: https://www.woods-mathews.com/  This test is not yet approved or cleared by the [place] FDA and  has been authorized for detection and/or diagnosis of SARS-CoV-2 by FDA under an Emergency Use Authorization (EUA). This EUA will remain  in effect (meaning this test can be used) for the duration of the COVID-19 declaration under Se ction 564(b)(1) of the Act, 21 U.S.C. section 360bbb-3(b)(1), unless the authorization is terminated or revoked sooner.  Performed at [facility], [place] [address], [place], [place] [address]          Radiology Studies: No results found.      Scheduled Meds: . amLODipine  10 mg Oral Daily  . apixaban  5 mg Oral BID  . feeding supplement (GLUCERNA SHAKE)  237 mL Oral Daily  .  folic acid  1 mg Oral Daily  . hydrALAZINE  10 mg Oral BID  . memantine  10 mg Oral BID  . mirabegron ER  50 mg Oral Daily  . multivitamin with minerals  1 tablet Oral Daily  . polyethylene glycol  17 g Oral BID  . QUEtiapine  50 mg Oral QID  . senna-docusate  1 tablet Oral BID  . sodium chloride flush  3 mL Intravenous Q12H  . thiamine  100 mg Oral Daily   Continuous Infusions:    LOS: 1 day    Time spent: 35 minutes    [place] Current A Ondine Gemme, MD [facility]   If 7PM-7AM, please contact night-coverage [email or web address]  04/20/2021, 1:57 PM

## 2021-04-20 NOTE — Evaluation (Signed)
Clinical/Bedside Swallow Evaluation Patient Details  Name: [name] [name] MRN: [id number] Date of Birth: [birth year]  Today's Date: 04/20/2021 Time: SLP Start Time (ACUTE ONLY): 0930 SLP Stop Time (ACUTE ONLY): 0950 SLP Time Calculation (min) (ACUTE ONLY): 20 min  Past Medical History:  Past Medical History:  Diagnosis Date  . Cancer [facility])    prostate  . Gallstones   . Hypertension   . Stone, kidney   . Stone, kidney    Past Surgical History:  Past Surgical History:  Procedure Laterality Date  . IR RADIOLOGIST EVAL & MGMT  01/12/2021  . PROSTATE SURGERY     HPI:  87yo male admitted 04/17/21 wtih generalized weakness and ongoing constipation. PMH: dementia, HTN, prostate cancer, CKD4, nephrolithiasis. CTHead - no acute abnormality   Assessment / Plan / Recommendation Clinical Impression  Pt seen at bedside for assessment of swallow function and safety. NT assisted SLP to reposition pt upright. CN exam appears unremarkable. Pt unable to consistently follow commands for full evaluation, due to cognitive impairment. Pt tolerated trials of thin liquid, puree, and solid textures. Extended oral prep noted with puree and cracker, which raises concern for increased fatigue as a full meal progresses. No overt s/s aspiration observed on any consistency. Will downgrade diet to dys 2/thin, primarily for energy conservation. Safe swallow precautions posted at [facility]. No further ST intervention recommended at this time. Please reconsult if needs arise.    SLP Visit Diagnosis: Dysphagia, unspecified (R13.10)    Aspiration Risk  Mild aspiration risk    Diet Recommendation Dysphagia 2 (Fine chop);Thin liquid   Liquid Administration via: Cup;Straw Medication Administration: Crushed with puree Supervision: Staff to assist with self feeding;Full supervision/cueing for compensatory strategies Compensations: Minimize environmental distractions;Slow rate;Small sips/bites Postural Changes: Seated upright  at 90 degrees;Remain upright for at least 30 minutes after po intake    Other  Recommendations Oral Care Recommendations: Oral care BID   Follow up Recommendations 24 hour supervision/assistance          Prognosis Prognosis for Safe Diet Advancement: Fair Barriers to Reach Goals: Cognitive deficits      Swallow Study   General Date of Onset: 04/17/21 HPI: 87yo male admitted 04/17/21 wtih generalized weakness and ongoing constipation. PMH: dementia, HTN, prostate cancer, CKD4, nephrolithiasis. CTHead - no acute abnormality Type of Study: Bedside Swallow Evaluation Previous Swallow Assessment: none Diet Prior to this Study: Dysphagia 3 (soft);Thin liquids Temperature Spikes Noted: No Respiratory Status: Room air History of Recent Intubation: No Behavior/Cognition: Doesn't follow directions;Alert;Cooperative;Pleasant mood;Confused;Distractible Oral Cavity Assessment: Within Functional Limits Oral Care Completed by SLP: No Oral Cavity - Dentition: Adequate natural dentition Vision: Functional for self-feeding Self-Feeding Abilities: Needs assist Patient Positioning: Upright in bed Baseline Vocal Quality: Normal Volitional Cough: Cognitively unable to elicit Volitional Swallow: Unable to elicit    Oral/Motor/Sensory Function Overall Oral Motor/Sensory Function: Within functional limits   Ice Chips Ice chips: Not tested   Thin Liquid Thin Liquid: Within functional limits Presentation: Self Fed;Straw    Nectar Thick Nectar Thick Liquid: Not tested   Honey Thick Honey Thick Liquid: Not tested   Puree Puree: Within functional limits Presentation: Spoon   Solid     Solid: Impaired Oral Phase Impairments: Impaired mastication Oral Phase Functional Implications: Prolonged oral transit;Oral residue     Blu Lori B. Quentin Ore, [facility], [place] Speech Language Pathologist Office: [phone] Pager: [phone]  Shonna Chock 04/20/2021,9:51 AM

## 2021-04-20 NOTE — TOC Progression Note (Signed)
Transition of Care [facility]) - Progression Note    Patient Details  Name: [name] [name] MRN: [id number] Date of Birth: [birth year]  Transition of Care [facility]) CM/SW [place], [place] Phone Number: 04/20/2021, 11:08 AM  Clinical Narrative:   Spoke with leadership who suggested a two prong approach of working on both respite bed through [place] and SNF placement at same time.  This decision was made as it was felt that going home with wife is not a safe plan at this point for patient, given her limited resources. Bed search initiated. TOC will continue to follow during the course of hospitalization.     Expected Discharge Plan: [place] (SNF v respite bed through the [place]) Barriers to Discharge:  (dispositional plan unclear; SNF v respite bed)  Expected Discharge Plan and Services Expected Discharge Plan: [place] (SNF v respite bed through the [place])   Discharge Planning Services: CM Consult Post Acute Care Choice: Resumption of Svcs/PTA Provider Living arrangements for the past 2 months: Single Family Home                                       Social Determinants of Health (SDOH) Interventions    Readmission Risk Interventions No flowsheet data found.

## 2021-04-21 MED ORDER — HYDRALAZINE HCL 10 MG PO TABS: 10.0000 mg | ORAL_TABLET | Freq: Two times a day (BID) | ORAL | 0 refills | Status: AC

## 2021-04-21 MED ORDER — THIAMINE HCL 100 MG PO TABS: 100.0000 mg | ORAL_TABLET | Freq: Every day | ORAL | 0 refills | Status: AC

## 2021-04-21 MED ORDER — SENNOSIDES-DOCUSATE SODIUM 8.6-50 MG PO TABS: 1.0000 | ORAL_TABLET | Freq: Two times a day (BID) | ORAL | 0 refills | Status: AC

## 2021-04-21 MED ORDER — LORAZEPAM 0.5 MG PO TABS: 0.5000 mg | ORAL_TABLET | Freq: Three times a day (TID) | ORAL | 0 refills | Status: AC | PRN

## 2021-04-21 NOTE — Progress Notes (Signed)
Physical Therapy Treatment Patient Details Name: [name] [name] MRN: [id number] DOB: 10/08/1933 Today's Date: 04/21/2021    History of Present Illness 87 year old male with PMH dementia, hypertension, prostate cancer, CKD 4, nephrolithiasis who presented to the ER with complaints of generalized weakness and ongoing constipation.    PT Comments    Pt progressing toward goals, slowly d/t difficulty processing. Pt is pleasantly confused and agreeable to work with PT. Continue to recommend SNF   Follow Up Recommendations  SNF     Equipment Recommendations  None recommended by PT    Recommendations for Other Services       Precautions / Restrictions Precautions Precautions: Fall Restrictions Weight Bearing Restrictions: No    Mobility  Bed Mobility Overal bed mobility: Needs Assistance Bed Mobility: Supine to Sit     Supine to sit: Max assist;HOB elevated;+2 for safety/equipment     General bed mobility comments: assist to facilitate LE movement, bring LEs off bed; assist to elevate trunk    Transfers Overall transfer level: Needs assistance Equipment used: Rolling walker (2 wheeled) Transfers: Sit to/from [facility] Sit to Stand: Mod assist;+2 safety/equipment;+2 physical assistance Stand pivot transfers: Mod assist;+2 physical assistance;+2 safety/equipment       General transfer comment: difficulty with motor planning; mutli-modal cues for pt to stand, assist with anterior - superior wt shift as well as assist to maintain standing  for nursing to complete peri-care  Ambulation/Gait                 Stairs             Wheelchair Mobility    Modified Rankin (Stroke Patients Only)       Balance                                            Cognition Arousal/Alertness: Awake/alert Behavior During Therapy: WFL for tasks assessed/performed Overall Cognitive Status: History of cognitive impairments - at  baseline Area of Impairment: Following commands;Problem solving                       Following Commands: Follows one step commands inconsistently     Problem Solving: Slow processing;Decreased initiation;Difficulty sequencing;Requires verbal cues;Requires tactile cues General Comments: requires repetition, multi-modal cues, hand over hand  to follow one step commands      Exercises      General Comments        Pertinent Vitals/Pain      Home Living                      Prior Function            PT Goals (current goals can now be found in the care plan section) Acute Rehab PT Goals Patient Stated Goal: unable to state; wife states to go to  rehab/SNF PT Goal Formulation: With family Time For Goal Achievement: 05/02/21 Potential to Achieve Goals: Good Progress towards PT goals: Progressing toward goals    Frequency    Min 2X/week      PT Plan Current plan remains appropriate    Co-evaluation              AM-PAC PT "6 Clicks" Mobility   Outcome Measure  Help needed turning from your back to your side while in a flat bed without using bedrails?: A  Lot Help needed moving from lying on your back to sitting on the side of a flat bed without using bedrails?: Total Help needed moving to and from a bed to a chair (including a wheelchair)?: Total Help needed standing up from a chair using your arms (e.g., wheelchair or bedside chair)?: Total Help needed to walk in hospital room?: Total Help needed climbing 3-5 steps with a railing? : Total 6 Click Score: 7    End of Session Equipment Utilized During Treatment: Gait belt Activity Tolerance: Patient tolerated treatment well Patient left: in chair;with call bell/phone within reach;with chair alarm set;with nursing/sitter in room Nurse Communication: Mobility status PT Visit Diagnosis: Other abnormalities of gait and mobility (R26.89)     Time: 1125-1140 PT Time Calculation (min) (ACUTE  ONLY): 15 min  Charges:  $Therapeutic Activity: 8-22 mins                     Baxter Flattery, PT  Acute Rehab Dept ([place]) [phone] Pager [phone]  04/21/2021    [facility] 04/21/2021, 12:27 PM

## 2021-04-21 NOTE — TOC Transition Note (Signed)
Transition of Care [facility]) - CM/SW Discharge Note   Patient Details  Name: Tymir Terral MRN: [id number] Date of Birth: 1933/04/21  Transition of Care [facility]) CM/SW Contact:  Trish Mage, LCSW Phone Number: 04/21/2021, 2:42 PM   Clinical Narrative:  Patient who is stable for today will transfer to [facility] in [facility]. Family alerted. PTAR arranged.  Nursing, please call report to [phone], room 122A. TOC sign off.     Final next level of care: Skilled Nursing Facility Barriers to Discharge: Barriers Resolved   Patient Goals and CMS Choice        Discharge Placement                       Discharge Plan and Services   Discharge Planning Services: CM Consult Post Acute Care Choice: Resumption of Svcs/PTA Provider                               Social Determinants of Health (SDOH) Interventions     Readmission Risk Interventions No flowsheet data found.

## 2021-04-21 NOTE — Progress Notes (Signed)
RN called report to [facility]. Discharge instructions and medication prescription placed in packet for receiving facility. Pt being d/c via PTAR to [facility].

## 2021-04-21 NOTE — Discharge Summary (Addendum)
Physician Discharge Summary  [name] [name] CHE:[id number] DOB: [birth year] DOA: 04/17/2021  PCP: [facility]  Admit date: 04/17/2021 Discharge date: 04/21/2021  Admitted From: Home  Disposition:  SNF/Home   Recommendations for Outpatient Follow-up:  1. Follow up with PCP in 1-2 weeks 2. Please obtain BMP/CBC in one week   Discharge Condition: Stable.  CODE STATUS: Full code Diet recommendation: Dysphagia 2 diet.   Brief/Interim Summary: 87 year old with past medical history significant for dementia, hypertension, prostate cancer, CKD stage IV, nephrolithiasis presented to the ER complaining of generalized weakness and ongoing constipation. Patient wife has been having trouble caring for patient at home.  Evaluation in the ED CT abdomen and pelvis showed mildly dilated proximal small bowel compared to the distal bowel.  There was possible concern for ileus or early obstruction or possibly due to the presence of contrast material approximately and not distally.  Received enema in the ER and he had a large bowel movement.    Ileus ([place])  1-Constipation, suspicious for ileus or early SBO: Continue with MiraLAX and senna Receive enema on admission.  Had bowel movement overnight x2.4/26. BM 4/27 Tolerating dysphagia diet better.   2-Physical Deconditioning, Dementia: Might need to consider long-term care placement. Continue  with Namenda Ativan and Seroquel Patient pocketing food, for lunch didn't want to swallow food. He is doing better with Dysphagia 2 diet.  Patient does not have a Actuary. He has not required a sitter/.   3-History of DVT: Continue with Eliquis.  4-Hypertension: Continue with amlodipine.  5-Acute metabolic encephalopathy: Patient more sleepy, lethargic  the morning 4/26, this could be related to delirium secondary to acute illness. B12: 748 and TSH ; 5.8 mildly elevated but Free T 4 normal 0.8, await Free T 3 : 2.5 . Needs repeat TSH in 4 weeks.   CT  head; no acute intracranial abnormalities.   6-CKD stage IV; CR stable.  CR baseline 2  Discharge Diagnoses:  Active Problems:   Constipation   History of DVT (deep vein thrombosis)   Dementia without behavioral disturbance ([place])   Ileus [facility])    Discharge Instructions  Discharge Instructions    Diet - low sodium heart healthy   Complete by: As directed    Increase activity slowly   Complete by: As directed      Allergies as of 04/21/2021   No Known Allergies     Medication List    TAKE these medications   amLODipine 10 MG tablet Commonly known as: NORVASC Take 10 mg by mouth daily.   apixaban 5 MG Tabs tablet Commonly known as: ELIQUIS Take 1 tablet (5 mg total) by mouth 2 (two) times daily. What changed: Another medication with the same name was removed. Continue taking this medication, and follow the directions you see here.   diclofenac Sodium 1 % Gel Commonly known as: VOLTAREN Apply 4 g topically 4 (four) times daily as needed (pain).   feeding supplement (GLUCERNA SHAKE) Liqd Take 237 mLs by mouth daily.   hydrALAZINE 10 MG tablet Commonly known as: APRESOLINE Take 1 tablet (10 mg total) by mouth 2 (two) times daily.   ipratropium 0.03 % nasal spray Commonly known as: ATROVENT Place 2 sprays into both nostrils every 6 (six) hours.   LORazepam 0.5 MG tablet Commonly known as: ATIVAN Take 1 tablet (0.5 mg total) by mouth every 8 (eight) hours as needed for anxiety.   memantine 10 MG tablet Commonly known as: NAMENDA Take 10 mg by mouth 2 (  two) times daily.   mirabegron ER 50 MG Tb24 tablet Commonly known as: MYRBETRIQ Take 50 mg by mouth daily.   multivitamin with minerals Tabs tablet Take 1 tablet by mouth daily.   polyethylene glycol powder 17 GM/SCOOP powder Commonly known as: MiraLax Please take 6 capfuls of MiraLAX in a 32 oz bottle of Gatorade over 2-4 hour period. The following day take 3 capfuls. On day 3 start taking 1 capful 3  times a day. Slowly cut back as needed until you have normal bowel movements.   QUEtiapine 100 MG tablet Commonly known as: SEROQUEL Take 50 mg by mouth in the morning, at noon, in the evening, and at bedtime.   senna-docusate 8.6-50 MG tablet Commonly known as: Senokot-S Take 1 tablet by mouth 2 (two) times daily.   thiamine 100 MG tablet Take 1 tablet (100 mg total) by mouth daily. Start taking on: April 22, 2021       No Known Allergies  Consultations: None  Procedures/Studies: CT Abdomen Pelvis Wo Contrast  Result Date: 04/17/2021 CLINICAL DATA:  Constipation for 1 month. History of lung cancer and prostate surgery. Decreased renal function. EXAM: CT ABDOMEN AND PELVIS WITHOUT CONTRAST TECHNIQUE: Multidetector CT imaging of the abdomen and pelvis was performed following the standard protocol without IV contrast. COMPARISON:  04/15/2021 FINDINGS: Lower chest: Small bilateral pleural effusions with basilar atelectasis. Coronary artery calcifications. Hepatobiliary: Sludge and stones in the gallbladder. No gallbladder wall thickening or stranding. No bile duct dilatation. No focal liver lesions. Pancreas: Unremarkable. No pancreatic ductal dilatation or surrounding inflammatory changes. Spleen: Normal in size without focal abnormality. Adrenals/Urinary Tract: Left adrenal gland nodule measuring 2.9 cm diameter. Density measurements of about 7 Hounsfield units consistent with benign adenoma. No change since prior study. Multiple bilateral renal cysts including a right upper pole cyst measuring 9.5 cm diameter. No hydronephrosis or hydroureter. No renal stones identified. Bladder is unremarkable. Stomach/Bowel: Stomach and proximal small bowel are contrast filled. Mildly dilated proximal small bowel in comparison to the distal bowel, possibly related to ileus although this may just be due to the presence of contrast material proximally and not distally. Stool fills the colon. No wall  thickening or inflammatory changes are suggested. Appendix is not identified. Vascular/Lymphatic: Aortic atherosclerosis. No enlarged abdominal or pelvic lymph nodes. Reproductive: Prostate gland is surgically absent. Surgical clips in the pelvis. No significant lymphadenopathy. Other: No free air or free fluid in the abdomen. Musculoskeletal: Asymmetric appearance of the left pelvis again demonstrated without change. Degenerative changes in the spine and hips. IMPRESSION: 1. Small bilateral pleural effusions with basilar atelectasis. 2. Sludge and stones in the gallbladder without evidence of cholecystitis. 3. Multiple bilateral renal cysts. 4. Left adrenal gland nodule consistent with benign adenoma. 5. Mildly dilated proximal small bowel in comparison to the distal bowel, possibly related to ileus or early obstruction although this may just be due to the presence of contrast material proximally and not distally. 6. Stool fills the colon consistent with history of constipation. 7. Asymmetric appearance of the left pelvis again demonstrated without change. Aortic Atherosclerosis (ICD10-I70.0). Electronically Signed   By: Lucienne Capers M.D.   On: 04/17/2021 20:05   CT ABDOMEN PELVIS WO CONTRAST  Result Date: 04/15/2021 CLINICAL DATA:  Constipation for 5 days. Suspected bowel obstruction. EXAM: CT ABDOMEN AND PELVIS WITHOUT CONTRAST TECHNIQUE: Multidetector CT imaging of the abdomen and pelvis was performed following the standard protocol without IV contrast. COMPARISON:  01/05/2015 FINDINGS: Lower chest: Linear atelectasis in the  lung bases. Coronary artery calcifications. Hepatobiliary: No focal liver lesions. Cholelithiasis with a single stone in the gallbladder. No inflammatory changes. No bile duct dilatation. Pancreas: Unremarkable. No pancreatic ductal dilatation or surrounding inflammatory changes. Spleen: Normal in size without focal abnormality. Adrenals/Urinary Tract: Left adrenal gland nodule  measuring 3.4 cm diameter. Fat density Hounsfield unit measurements consistent with a benign adenoma. No change since prior study. Multiple bilateral renal cysts. Largest in the right upper pole measures 10.3 cm diameter, unchanged since prior study. In the medial mid left kidney, there is a 2.3 cm diameter mixed density lesion but this appears unchanged in size since prior study, likely representing hemorrhagic cyst. No hydronephrosis or stones identified. Bladder is normal. Stomach/Bowel: Diffusely stool-filled colon likely due to constipation. Mid abdominal small bowel are filled with gas and fluid with small air-fluid levels. Maximal small bowel diameter is about 3 cm, upper limits of normal. No specific transition zone. Changes likely to represent ileus or enteritis. No wall thickening is appreciated. Appendix is normal. Vascular/Lymphatic: Aortic atherosclerosis. No enlarged abdominal or pelvic lymph nodes. Reproductive: Surgical absence of the prostate gland with multiple surgical clips in the pelvis. No significant lymphadenopathy. Other: No free air or free fluid in the abdomen. Abdominal wall musculature appears intact. Musculoskeletal: Asymmetrical appearance of the left pelvis with mild expansile change and heterogeneous marrow density. Possibly infiltrative bone lesion or fibrous dysplasia. Bone scan correlation suggested. IMPRESSION: 1. Diffusely stool-filled colon likely due to constipation. 2. Mid abdominal small bowel are filled with gas and fluid with small air-fluid levels. Maximal small bowel diameter is about 3 cm, upper limits of normal. Changes likely to represent ileus or enteritis. No specific transition zone is appreciated. 3. Multiple bilateral renal cysts. 4. Left adrenal gland adenoma. 5. Cholelithiasis without evidence of cholecystitis. 6. Asymmetric appearance of the left pelvis. Suggest bone scan correlation. Aortic Atherosclerosis (ICD10-I70.0). Electronically Signed   By: Lucienne Capers M.D.   On: 04/15/2021 01:05   CT HEAD WO CONTRAST  Result Date: 04/18/2021 CLINICAL DATA:  Delirium. EXAM: CT HEAD WITHOUT CONTRAST TECHNIQUE: Contiguous axial images were obtained from the base of the skull through the vertex without intravenous contrast. COMPARISON:  Prior head CT examinations 03/25/2016 and earlier. FINDINGS: Brain: Moderate to moderately advanced cerebral atrophy. Commensurate prominence of the ventricles and sulci. Comparatively mild cerebellar atrophy. Advanced patchy and ill-defined hypoattenuation within the cerebral white matter, nonspecific but compatible with chronic small vessel ischemic disease. There is no acute intracranial hemorrhage. No demarcated cortical infarct. No extra-axial fluid collection. No evidence of intracranial mass. No midline shift. Vascular: No hyperdense vessel.  Atherosclerotic calcifications Skull: Normal. Negative for fracture or focal suspicious osseous lesion. Sinuses/Orbits: Visualized orbits show no acute finding. Trace bilateral ethmoid and maxillary sinus mucosal thickening. Small right maxillary sinus mucous retention cyst. IMPRESSION: No evidence of acute intracranial abnormality. Advanced cerebral white matter chronic small vessel ischemic disease, progressed from the prior head CT of 03/25/2016. Moderate to moderately advanced cerebral atrophy, also progressed. Comparatively mild cerebellar atrophy. Electronically Signed   By: Kellie Simmering DO   On: 04/18/2021 14:43   DG ABD ACUTE 2+V W 1V CHEST  Result Date: 04/15/2021 CLINICAL DATA:  Constipation for 5 days. EXAM: DG ABDOMEN ACUTE WITH 1 VIEW CHEST COMPARISON:  03/29/2021 FINDINGS: Shallow inspiration with linear atelectasis in the lung bases. Heart size and pulmonary vascularity are normal. No pleural effusions. No pneumothorax. Mediastinal contours appear intact. Gas and stool are demonstrated throughout the colon. Mildly distended gas-filled  mid abdominal small bowel. No free  intra-abdominal air. No abnormal air-fluid levels. No radiopaque stones. Surgical clips in the pelvis. Degenerative changes in the spine and hips. IMPRESSION: 1. Shallow inspiration with atelectasis in the lung bases. 2. Mildly distended gas-filled mid abdominal small bowel suggesting early obstruction. Electronically Signed   By: Lucienne Capers M.D.   On: 04/15/2021 00:06   DG ABD ACUTE 2+V W 1V CHEST  Result Date: 03/29/2021 CLINICAL DATA:  Constipation for 5 days, blood in urine starting today, former smoker, history Alzheimer's EXAM: DG ABDOMEN ACUTE WITH 1 VIEW CHEST COMPARISON:  Chest radiograph 01/17/2018 FINDINGS: Normal heart size and pulmonary vascularity. Atherosclerotic calcification and tortuosity of thoracic aorta. Decreased lung volumes with bibasilar atelectasis. No acute infiltrate, pleural effusion or pneumothorax. Increased stool throughout colon and rectum consistent with history of constipation. Few wears filled small bowel loops noted. No evidence of bowel obstruction, bowel wall thickening or free air. Numerous surgical clips in pelvis by history prior prostate surgery. IMPRESSION: Increased stool throughout colon and rectum consistent with constipation. Bibasilar atelectasis. Aortic Atherosclerosis (ICD10-I70.0). Electronically Signed   By: Lavonia Dana M.D.   On: 03/29/2021 19:00      Subjective: Alert, no new complaints.    Discharge Exam: Vitals:   04/21/21 0448 04/21/21 1129  BP: (!) 152/95 (!) 180/97  Pulse: 69   Resp: 18   Temp: 98.5 F (36.9 C)   SpO2: 99%      General: Pt is alert, awake, not in acute distress Cardiovascular: RRR, S1/S2 +, no rubs, no gallops Respiratory: CTA bilaterally, no wheezing, no rhonchi Abdominal: Soft, NT, ND, bowel sounds + Extremities: no edema, no cyanosis    The results of significant diagnostics from this hospitalization (including imaging, microbiology, ancillary and laboratory) are listed below for reference.      Microbiology: Recent Results (from the past 240 hour(s))  SARS CORONAVIRUS 2 (TAT 6-24 HRS) Nasopharyngeal Nasopharyngeal Swab     Status: None   Collection Time: 04/17/21  9:19 PM   Specimen: Nasopharyngeal Swab  Result Value Ref Range Status   SARS Coronavirus 2 NEGATIVE NEGATIVE Final    Comment: (NOTE) SARS-CoV-2 target nucleic acids are NOT DETECTED.  The SARS-CoV-2 RNA is generally detectable in upper and lower respiratory specimens during the acute phase of infection. Negative results do not preclude SARS-CoV-2 infection, do not rule out co-infections with other pathogens, and should not be used as the sole basis for treatment or other patient management decisions. Negative results must be combined with clinical observations, patient history, and epidemiological information. The expected result is Negative.  Fact Sheet for Patients: SugarRoll.be  Fact Sheet for Healthcare Providers: https://www.woods-mathews.com/  This test is not yet approved or cleared by the [place] FDA and  has been authorized for detection and/or diagnosis of SARS-CoV-2 by FDA under an Emergency Use Authorization (EUA). This EUA will remain  in effect (meaning this test can be used) for the duration of the COVID-19 declaration under Se ction 564(b)(1) of the Act, 21 U.S.C. section 360bbb-3(b)(1), unless the authorization is terminated or revoked sooner.  Performed at [facility], [place] [address], [place], [place] [address]      Labs: BNP (last 3 results) Recent Labs    04/17/21 1300  BNP 28.7   Basic Metabolic Panel: Recent Labs  Lab 04/14/21 2318 04/17/21 1300 04/18/21 1138 04/19/21 0501  NA 137 144 137 137  K 4.6 4.5 4.4 4.9  CL 104 108 103 103  CO2 26 27 27 26   GLUCOSE 154* 156* 134* 127*  BUN 31* 23 17 16   CREATININE 2.14* 2.02* 1.64* 1.70*  CALCIUM 8.9 9.4 8.9 9.3   Liver Function Tests: Recent Labs  Lab  04/17/21 1300  AST 15  ALT 9  ALKPHOS 90  BILITOT 0.4  PROT 7.7  ALBUMIN 3.5   Recent Labs  Lab 04/17/21 1300  LIPASE 45   No results for input(s): AMMONIA in the last 168 hours. CBC: Recent Labs  Lab 04/14/21 2318 04/17/21 1300  WBC 9.9 9.9  NEUTROABS 7.4 7.7  HGB 10.0* 10.9*  HCT 33.6* 36.4*  MCV 73.8* 73.8*  PLT 234 272   Cardiac Enzymes: No results for input(s): CKTOTAL, CKMB, CKMBINDEX, TROPONINI in the last 168 hours. BNP: Invalid input(s): POCBNP CBG: Recent Labs  Lab 04/18/21 1222  GLUCAP 140*   D-Dimer No results for input(s): DDIMER in the last 72 hours. Hgb A1c No results for input(s): HGBA1C in the last 72 hours. Lipid Profile No results for input(s): CHOL, HDL, LDLCALC, TRIG, CHOLHDL, LDLDIRECT in the last 72 hours. Thyroid function studies Recent Labs    04/19/21 0501  T3FREE 2.5   Anemia work up No results for input(s): VITAMINB12, FOLATE, FERRITIN, TIBC, IRON, RETICCTPCT in the last 72 hours. Urinalysis    Component Value Date/Time   COLORURINE YELLOW 04/17/2021 1534   APPEARANCEUR CLEAR 04/17/2021 1534   LABSPEC 1.012 04/17/2021 1534   PHURINE 6.0 04/17/2021 1534   GLUCOSEU NEGATIVE 04/17/2021 1534   HGBUR NEGATIVE 04/17/2021 1534   BILIRUBINUR NEGATIVE 04/17/2021 1534   KETONESUR NEGATIVE 04/17/2021 1534   PROTEINUR 30 (A) 04/17/2021 1534   UROBILINOGEN 0.2 11/06/2015 1730   NITRITE NEGATIVE 04/17/2021 1534   LEUKOCYTESUR NEGATIVE 04/17/2021 1534   Sepsis Labs Invalid input(s): PROCALCITONIN,  WBC,  LACTICIDVEN Microbiology Recent Results (from the past 240 hour(s))  SARS CORONAVIRUS 2 (TAT 6-24 HRS) Nasopharyngeal Nasopharyngeal Swab     Status: None   Collection Time: 04/17/21  9:19 PM   Specimen: Nasopharyngeal Swab  Result Value Ref Range Status   SARS Coronavirus 2 NEGATIVE NEGATIVE Final    Comment: (NOTE) SARS-CoV-2 target nucleic acids are NOT DETECTED.  The SARS-CoV-2 RNA is generally detectable in upper and  lower respiratory specimens during the acute phase of infection. Negative results do not preclude SARS-CoV-2 infection, do not rule out co-infections with other pathogens, and should not be used as the sole basis for treatment or other patient management decisions. Negative results must be combined with clinical observations, patient history, and epidemiological information. The expected result is Negative.  Fact Sheet for Patients: SugarRoll.be  Fact Sheet for Healthcare Providers: https://www.woods-mathews.com/  This test is not yet approved or cleared by the [place] FDA and  has been authorized for detection and/or diagnosis of SARS-CoV-2 by FDA under an Emergency Use Authorization (EUA). This EUA will remain  in effect (meaning this test can be used) for the duration of the COVID-19 declaration under Se ction 564(b)(1) of the Act, 21 U.S.C. section 360bbb-3(b)(1), unless the authorization is terminated or revoked sooner.  Performed at [facility], [place] [address], [place], [place] [address]      Time coordinating discharge: 40 minutes  SIGNED:   Elmarie Shiley, MD  [facility]

## 2021-04-21 NOTE — TOC Progression Note (Addendum)
Transition of Care [facility]) - Progression Note    Patient Details  Name: [name] [name] MRN: [id number] Date of Birth: 15-Dec-1933  Transition of Care [facility]) CM/SW [place], [place] Phone Number: 04/21/2021, 8:38 AM  Clinical Narrative:   Bernadene Bell approval reference#[id number], 5 days 4/29-5/3, Reviewer is Lynn [email or web address]  [phone], authroization #[id number]. This is for [facility]. Will speak to wife about finalizing choice. TOC will continue to follow during the course of hospitalization.     Expected Discharge Plan: [place] (SNF v respite bed through the [place]) Barriers to Discharge:  (dispositional plan unclear; SNF v respite bed)  Expected Discharge Plan and Services Expected Discharge Plan: [place] (SNF v respite bed through the [place])   Discharge Planning Services: CM Consult Post Acute Care Choice: Resumption of Svcs/PTA Provider Living arrangements for the past 2 months: Single Family Home                                       Social Determinants of Health (SDOH) Interventions    Readmission Risk Interventions No flowsheet data found.

## 2021-04-22 NOTE — Progress Notes (Signed)
04/22/21 0240- [facility] arrived and stated that the scheduler was closed but they were still transporting. Offered to  family to call [facility] to see if they would take patient, but spouse was not happy and said "It is too late to take a patient with Alzheimers".

## 2021-04-22 NOTE — Progress Notes (Signed)
04/21/21 @ 2030- [facility] called and patient was confirmed to be 3rd in line to be picked up.  04/21/21 @ 2230- [facility] called and patient was still 3rd in line to be picked up.  04/22/21 @ 0215- Spoke to [employer] who stated it would be best for patient to be picked up and transported in the morning. Family member in agreement. [facility] called to inform of plans, but they were not longer open. Will plan for patient to transport in AM.

## 2021-04-22 NOTE — Progress Notes (Signed)
Pt discharged to [facility] via [facility]. Wife at bedside and RN went over all discharge instructions and medication education and answered all the pt's and wifes concerns.

## 2024-05-24 DEATH — deceased
